# Patient Record
Sex: Male | Born: 1965 | Race: White | Hispanic: No | Smoking: Former smoker
Health system: Southern US, Community
[De-identification: ages and names within clinical notes are randomized; demographics above are authoritative.]

## PROBLEM LIST (undated history)

## (undated) DIAGNOSIS — D369 Benign neoplasm, unspecified site: Secondary | ICD-10-CM

## (undated) DIAGNOSIS — K529 Noninfective gastroenteritis and colitis, unspecified: Secondary | ICD-10-CM

## (undated) DIAGNOSIS — N419 Inflammatory disease of prostate, unspecified: Secondary | ICD-10-CM

## (undated) DIAGNOSIS — K219 Gastro-esophageal reflux disease without esophagitis: Secondary | ICD-10-CM

## (undated) DIAGNOSIS — F32A Depression, unspecified: Secondary | ICD-10-CM

## (undated) DIAGNOSIS — K5790 Diverticulosis of intestine, part unspecified, without perforation or abscess without bleeding: Secondary | ICD-10-CM

## (undated) DIAGNOSIS — I10 Essential (primary) hypertension: Secondary | ICD-10-CM

## (undated) DIAGNOSIS — K51311 Ulcerative (chronic) rectosigmoiditis with rectal bleeding: Secondary | ICD-10-CM

## (undated) DIAGNOSIS — F329 Major depressive disorder, single episode, unspecified: Secondary | ICD-10-CM

## (undated) DIAGNOSIS — T884XXA Failed or difficult intubation, initial encounter: Secondary | ICD-10-CM

## (undated) DIAGNOSIS — N451 Epididymitis: Secondary | ICD-10-CM

## (undated) DIAGNOSIS — F419 Anxiety disorder, unspecified: Secondary | ICD-10-CM

## (undated) DIAGNOSIS — M109 Gout, unspecified: Secondary | ICD-10-CM

## (undated) DIAGNOSIS — J309 Allergic rhinitis, unspecified: Secondary | ICD-10-CM

## (undated) HISTORY — PX: FUNCTIONAL ENDOSCOPIC SINUS SURGERY: SUR616

## (undated) HISTORY — PX: COLONOSCOPY: SHX174

## (undated) HISTORY — PX: TONSILLECTOMY: SUR1361

---

## 1989-01-13 HISTORY — PX: BACK SURGERY: SHX140

## 2006-01-13 HISTORY — PX: OTHER SURGICAL HISTORY: SHX169

## 2006-06-01 ENCOUNTER — Ambulatory Visit: Payer: Self-pay | Admitting: Unknown Physician Specialty

## 2006-08-10 ENCOUNTER — Ambulatory Visit: Payer: Self-pay | Admitting: Otolaryngology

## 2006-10-13 ENCOUNTER — Ambulatory Visit: Payer: Self-pay | Admitting: Gastroenterology

## 2013-12-26 ENCOUNTER — Ambulatory Visit: Payer: Self-pay

## 2014-01-13 DIAGNOSIS — K51311 Ulcerative (chronic) rectosigmoiditis with rectal bleeding: Secondary | ICD-10-CM

## 2014-01-13 HISTORY — DX: Ulcerative (chronic) rectosigmoiditis with rectal bleeding: K51.311

## 2014-02-01 DIAGNOSIS — I10 Essential (primary) hypertension: Secondary | ICD-10-CM | POA: Insufficient documentation

## 2014-02-01 DIAGNOSIS — K219 Gastro-esophageal reflux disease without esophagitis: Secondary | ICD-10-CM | POA: Insufficient documentation

## 2014-02-02 ENCOUNTER — Ambulatory Visit: Payer: Self-pay | Admitting: Internal Medicine

## 2014-02-20 ENCOUNTER — Ambulatory Visit: Payer: Self-pay | Admitting: Gastroenterology

## 2014-03-20 DIAGNOSIS — K51311 Ulcerative (chronic) rectosigmoiditis with rectal bleeding: Secondary | ICD-10-CM | POA: Insufficient documentation

## 2014-04-11 ENCOUNTER — Emergency Department: Payer: Self-pay | Admitting: Emergency Medicine

## 2014-04-12 ENCOUNTER — Emergency Department
Admit: 2014-04-12 | Disposition: A | Discharge status: Home / Self Care | Payer: Self-pay | Admitting: Emergency Medicine

## 2014-05-08 LAB — SURGICAL PATHOLOGY

## 2014-08-19 ENCOUNTER — Emergency Department
Admission: EM | Admit: 2014-08-19 | Discharge: 2014-08-19 | Disposition: A | Discharge status: Home / Self Care | Payer: Self-pay

## 2014-08-24 ENCOUNTER — Other Ambulatory Visit: Payer: Self-pay | Admitting: Podiatry

## 2014-08-24 ENCOUNTER — Ambulatory Visit
Admission: RE | Admit: 2014-08-24 | Discharge: 2014-08-24 | Disposition: A | Discharge status: Home / Self Care | Payer: No Typology Code available for payment source | Source: Ambulatory Visit | Attending: Podiatry | Admitting: Podiatry

## 2014-08-24 DIAGNOSIS — M79662 Pain in left lower leg: Secondary | ICD-10-CM

## 2014-08-24 DIAGNOSIS — R600 Localized edema: Secondary | ICD-10-CM | POA: Insufficient documentation

## 2014-08-24 DIAGNOSIS — M7989 Other specified soft tissue disorders: Secondary | ICD-10-CM

## 2015-01-14 DIAGNOSIS — D369 Benign neoplasm, unspecified site: Secondary | ICD-10-CM

## 2015-01-14 HISTORY — DX: Benign neoplasm, unspecified site: D36.9

## 2015-05-11 ENCOUNTER — Other Ambulatory Visit (HOSPITAL_COMMUNITY): Payer: Self-pay | Admitting: Nurse Practitioner

## 2015-05-11 DIAGNOSIS — K529 Noninfective gastroenteritis and colitis, unspecified: Secondary | ICD-10-CM

## 2015-05-11 DIAGNOSIS — R1084 Generalized abdominal pain: Secondary | ICD-10-CM

## 2015-05-15 ENCOUNTER — Ambulatory Visit: Admission: RE | Admit: 2015-05-15 | Payer: PRIVATE HEALTH INSURANCE | Source: Ambulatory Visit

## 2015-08-10 ENCOUNTER — Other Ambulatory Visit: Payer: Self-pay | Admitting: Family Medicine

## 2015-08-11 LAB — CMP12+LP+TP+TSH+6AC+PSA+CBC…
ALT: 44 IU/L (ref 0–44)
AST: 32 IU/L (ref 0–40)
Albumin/Globulin Ratio: 2 (ref 1.2–2.2)
Albumin: 4.5 g/dL (ref 3.5–5.5)
Alkaline Phosphatase: 61 IU/L (ref 39–117)
BUN / CREAT RATIO: 13 (ref 9–20)
BUN: 13 mg/dL (ref 6–24)
Basophils Absolute: 0.1 10*3/uL (ref 0.0–0.2)
Basos: 2 %
Bilirubin Total: 0.2 mg/dL (ref 0.0–1.2)
CHOLESTEROL TOTAL: 227 mg/dL — AB (ref 100–199)
Calcium: 9.4 mg/dL (ref 8.7–10.2)
Chloride: 99 mmol/L (ref 96–106)
Chol/HDL Ratio: 3.4 ratio units (ref 0.0–5.0)
Creatinine, Ser: 0.98 mg/dL (ref 0.76–1.27)
EOS (ABSOLUTE): 0.3 10*3/uL (ref 0.0–0.4)
EOS: 6 %
ESTIMATED CHD RISK: 0.5 times avg. (ref 0.0–1.0)
FREE THYROXINE INDEX: 1.5 (ref 1.2–4.9)
GFR, EST AFRICAN AMERICAN: 103 mL/min/{1.73_m2} (ref >59)
GFR, EST NON AFRICAN AMERICAN: 89 mL/min/{1.73_m2} (ref >59)
GGT: 30 IU/L (ref 0–65)
GLOBULIN, TOTAL: 2.3 g/dL (ref 1.5–4.5)
Glucose: 90 mg/dL (ref 65–99)
HDL: 66 mg/dL (ref >39)
HEMOGLOBIN: 13.9 g/dL (ref 12.6–17.7)
Hematocrit: 40.9 % (ref 37.5–51.0)
Immature Grans (Abs): 0 10*3/uL (ref 0.0–0.1)
Immature Granulocytes: 0 %
Iron: 91 ug/dL (ref 38–169)
LDH: 154 IU/L (ref 121–224)
LDL CALC: 135 mg/dL — AB (ref 0–99)
Lymphocytes Absolute: 1.6 10*3/uL (ref 0.7–3.1)
Lymphs: 40 %
MCH: 30.5 pg (ref 26.6–33.0)
MCHC: 34 g/dL (ref 31.5–35.7)
MCV: 90 fL (ref 79–97)
Monocytes Absolute: 0.4 10*3/uL (ref 0.1–0.9)
Monocytes: 11 %
NEUTROS ABS: 1.6 10*3/uL (ref 1.4–7.0)
Neutrophils: 41 %
PLATELETS: 299 10*3/uL (ref 150–379)
POTASSIUM: 4.6 mmol/L (ref 3.5–5.2)
Phosphorus: 3.6 mg/dL (ref 2.5–4.5)
Prostate Specific Ag, Serum: 0.7 ng/mL (ref 0.0–4.0)
RBC: 4.56 x10E6/uL (ref 4.14–5.80)
RDW: 13.8 % (ref 12.3–15.4)
SODIUM: 136 mmol/L (ref 134–144)
T3 Uptake Ratio: 29 % (ref 24–39)
T4, Total: 5.3 ug/dL (ref 4.5–12.0)
TSH: 1.91 u[IU]/mL (ref 0.450–4.500)
Total Protein: 6.8 g/dL (ref 6.0–8.5)
Triglycerides: 130 mg/dL (ref 0–149)
Uric Acid: 7.2 mg/dL (ref 3.7–8.6)
VLDL Cholesterol Cal: 26 mg/dL (ref 5–40)
WBC: 3.9 10*3/uL (ref 3.4–10.8)

## 2015-08-11 LAB — HGB A1C W/O EAG: Hgb A1c MFr Bld: 5.3 % (ref 4.8–5.6)

## 2015-08-11 LAB — VITAMIN D 25 HYDROXY (VIT D DEFICIENCY, FRACTURES): Vit D, 25-Hydroxy: 23.3 ng/mL — ABNORMAL LOW (ref 30.0–100.0)

## 2015-09-26 ENCOUNTER — Other Ambulatory Visit: Payer: Self-pay | Admitting: Gastroenterology

## 2015-09-26 DIAGNOSIS — R1084 Generalized abdominal pain: Secondary | ICD-10-CM

## 2015-09-26 DIAGNOSIS — K51019 Ulcerative (chronic) pancolitis with unspecified complications: Secondary | ICD-10-CM

## 2015-10-01 ENCOUNTER — Ambulatory Visit
Admission: RE | Admit: 2015-10-01 | Discharge: 2015-10-01 | Disposition: A | Discharge status: Home / Self Care | Payer: No Typology Code available for payment source | Source: Ambulatory Visit | Attending: Gastroenterology | Admitting: Gastroenterology

## 2015-10-01 DIAGNOSIS — K51919 Ulcerative colitis, unspecified with unspecified complications: Secondary | ICD-10-CM | POA: Insufficient documentation

## 2015-10-01 DIAGNOSIS — R933 Abnormal findings on diagnostic imaging of other parts of digestive tract: Secondary | ICD-10-CM | POA: Insufficient documentation

## 2015-10-01 DIAGNOSIS — K5730 Diverticulosis of large intestine without perforation or abscess without bleeding: Secondary | ICD-10-CM | POA: Diagnosis not present

## 2015-10-01 DIAGNOSIS — R1084 Generalized abdominal pain: Secondary | ICD-10-CM | POA: Diagnosis not present

## 2015-10-01 DIAGNOSIS — K760 Fatty (change of) liver, not elsewhere classified: Secondary | ICD-10-CM | POA: Insufficient documentation

## 2015-10-01 DIAGNOSIS — R9341 Abnormal radiologic findings on diagnostic imaging of renal pelvis, ureter, or bladder: Secondary | ICD-10-CM | POA: Insufficient documentation

## 2015-10-01 DIAGNOSIS — K51019 Ulcerative (chronic) pancolitis with unspecified complications: Secondary | ICD-10-CM

## 2015-10-01 HISTORY — DX: Essential (primary) hypertension: I10

## 2015-10-01 MED ORDER — IOPAMIDOL (ISOVUE-300) INJECTION 61%
100.0000 mL | Freq: Once | INTRAVENOUS | Status: AC | PRN
  Administered 2015-10-01: 100 mL via INTRAVENOUS

## 2015-10-29 DIAGNOSIS — D369 Benign neoplasm, unspecified site: Secondary | ICD-10-CM | POA: Insufficient documentation

## 2016-05-13 ENCOUNTER — Ambulatory Visit: Payer: Self-pay | Admitting: Registered Nurse

## 2016-05-13 VITALS — BP 136/87 | HR 77 | Temp 98.0°F | Resp 16

## 2016-05-13 DIAGNOSIS — J301 Allergic rhinitis due to pollen: Secondary | ICD-10-CM

## 2016-05-13 DIAGNOSIS — J0101 Acute recurrent maxillary sinusitis: Secondary | ICD-10-CM

## 2016-05-13 DIAGNOSIS — H6593 Unspecified nonsuppurative otitis media, bilateral: Secondary | ICD-10-CM

## 2016-05-13 MED ORDER — SALINE SPRAY 0.65 % NA SOLN: 2.0000 | NASAL | 0 refills | Status: DC

## 2016-05-13 MED ORDER — ACETAMINOPHEN 325 MG PO TABS: 650.0000 mg | ORAL_TABLET | Freq: Four times a day (QID) | ORAL | Status: AC | PRN

## 2016-05-13 MED ORDER — AZITHROMYCIN 250 MG PO TABS: ORAL_TABLET | ORAL | 0 refills | Status: DC

## 2016-05-13 NOTE — Patient Instructions (Addendum)
Continue claritin 10mg  by mouth daily, flonase 1 spray each nostril twice a day Nasal saline 2 sprays each nostril every 2 hours as needed congestion at least twice a day use in shower Shower twice a day (prior to bedtime) Do not sleep with open windows Start azithromycin-2 tabs by mouth today then 1 tab daily days 2-5 Tylenol 650-1000mg  by mouth every 6 hours as needed for pain/fever Cough drops by mouth every 2 hours as needed for cough Follow up re-evaluation if no improvement with 48 hours plan of care and/or coughing up blood, shortness of breath, wheezing, ear discharge  Allergic Rhinitis Allergic rhinitis is when the mucous membranes in the nose respond to allergens. Allergens are particles in the air that cause your body to have an allergic reaction. This causes you to release allergic antibodies. Through a chain of events, these eventually cause you to release histamine into the blood stream. Although meant to protect the body, it is this release of histamine that causes your discomfort, such as frequent sneezing, congestion, and an itchy, runny nose. What are the causes? Seasonal allergic rhinitis (hay fever) is caused by pollen allergens that may come from grasses, trees, and weeds. Year-round allergic rhinitis (perennial allergic rhinitis) is caused by allergens such as house dust mites, pet dander, and mold spores. What are the signs or symptoms?  Nasal stuffiness (congestion).  Itchy, runny nose with sneezing and tearing of the eyes. How is this diagnosed? Your health care provider can help you determine the allergen or allergens that trigger your symptoms. If you and your health care provider are unable to determine the allergen, skin or blood testing may be used. Your health care provider will diagnose your condition after taking your health history and performing a physical exam. Your health care provider may assess you for other related conditions, such as asthma, pink eye, or an  ear infection. How is this treated? Allergic rhinitis does not have a cure, but it can be controlled by:  Medicines that block allergy symptoms. These may include allergy shots, nasal sprays, and oral antihistamines.  Avoiding the allergen. Hay fever may often be treated with antihistamines in pill or nasal spray forms. Antihistamines block the effects of histamine. There are over-the-counter medicines that may help with nasal congestion and swelling around the eyes. Check with your health care provider before taking or giving this medicine. If avoiding the allergen or the medicine prescribed do not work, there are many new medicines your health care provider can prescribe. Stronger medicine may be used if initial measures are ineffective. Desensitizing injections can be used if medicine and avoidance does not work. Desensitization is when a patient is given ongoing shots until the body becomes less sensitive to the allergen. Make sure you follow up with your health care provider if problems continue. Follow these instructions at home: It is not possible to completely avoid allergens, but you can reduce your symptoms by taking steps to limit your exposure to them. It helps to know exactly what you are allergic to so that you can avoid your specific triggers. Contact a health care provider if:  You have a fever.  You develop a cough that does not stop easily (persistent).  You have shortness of breath.  You start wheezing.  Symptoms interfere with normal daily activities. This information is not intended to replace advice given to you by your health care provider. Make sure you discuss any questions you have with your health care provider. Document Released:  09/24/2000 Document Revised: 08/31/2015 Document Reviewed: 09/06/2012 Elsevier Interactive Patient Education  2017 [employer].  Otitis Media With Effusion, Pediatric Otitis media with effusion (OME) occurs when there is inflammation  of the middle ear and fluid in the middle ear space. There are no signs and symptoms of infection. The middle ear space contains air and the bones for hearing. Air in the middle ear space helps to transmit sound to the brain. OME is a common condition in children, and it often occurs after an ear infection. This condition may be present for several weeks or longer after an ear infection. Most cases of this condition get better on their own. What are the causes? OME is caused by a blockage of the eustachian tube in one or both ears. These tubes drain fluid in the ears to the back of the nose (nasopharynx). If the tissue in the tube swells up (edema), the tube closes. This prevents fluid from draining. Blockage can be caused by:  Ear infections.  Colds and other upper respiratory infections.  Allergies.  Irritants, such as tobacco smoke.  Enlarged adenoids. The adenoids are areas of soft tissue located high in the back of the throat, behind the nose and the roof of the mouth. They are part of the body's natural defense (immune) system.  A mass in the nasopharynx.  Damage to the ear caused by pressure changes (barotrauma). What increases the risk? Your child is more likely to develop this condition if:  He or she has repeated ear and sinus infections.  He or she has allergies.  He or she is exposed to tobacco smoke.  He or she attends daycare.  He or she is not breastfed. What are the signs or symptoms? Symptoms of this condition may not be obvious. Sometimes this condition does not have any symptoms, or symptoms may overlap with those of a cold or upper respiratory tract illness. Symptoms of this condition include:  Temporary hearing loss.  A feeling of fullness in the ear without pain.  Irritability or agitation.  Balance (vestibular) problems. As a result of hearing loss, your child may:  Listen to the TV at a loud volume.  Not respond to questions.  Ask "What?" often  when spoken to.  Mistake or confuse one sound or word for another.  Perform poorly at school.  Have a poor attention span.  Become agitated or irritated easily. How is this diagnosed? This condition is diagnosed with an ear exam. Your child's health care provider will look inside your child's ear with an instrument (otoscope) to check for redness, swelling, and fluid. Other tests may be done, including:  A test to check the movement of the eardrum (pneumatic otoscopy). This is done by squeezing a small amount of air into the ear.  A test that changes air pressure in the middle ear to check how well the eardrum moves and to see if the eustachian tube is working (tympanogram).  Hearing test (audiogram). This test involves playing tones at different pitches to see if your child can hear each tone. How is this treated? Treatment for this condition depends on the cause. In many cases, the fluid goes away on its own. In some cases, your child may need a procedure to create a hole in the eardrum to allow fluid to drain (myringotomy) and to insert small drainage tubes (tympanostomy tubes) into the eardrums. These tubes help to drain fluid and prevent infection. This procedure may be recommended if:  OME does  not get better over several months.  Your child has many ear infections within several months.  Your child has noticeable hearing loss.  Your child has problems with speech and language development. Surgery may also be done to remove the adenoids (adenoidectomy). Follow these instructions at home:  Give over-the-counter and prescription medicines only as told by your child's health care provider.  Keep children away from any tobacco smoke.  Keep all follow-up visits as told by your child's health care provider. This is important. How is this prevented?  Keep your child's vaccinations up to date. Make sure your child gets all recommended vaccinations, including a pneumonia and flu  vaccine.  Encourage hand washing. Your child should wash his or her hands often with soap and water. If there is no soap and water, he or she should use hand sanitizer.  Avoid exposing your child to tobacco smoke.  Breastfeed your baby, if possible. Babies who are breastfed as long as possible are less likely to develop this condition. Contact a health care provider if:  Your child's hearing does not get better after 3 months.  Your child's hearing is worse.  Your child has ear pain.  Your child has a fever.  Your child has drainage from the ear.  Your child is dizzy.  Your child has a lump on his or her neck. Get help right away if:  Your child has bleeding from the nose.  Your child cannot move part of her or his face.  Your child has trouble breathing.  Your child cannot smell.  Your child develops severe congestion.  Your child develops weakness.  Your child who is younger than 3 months has a temperature of 100F (38C) or higher. Summary  Otitis media with effusion (OME) occurs when there is inflammation of the middle ear and fluid in the middle ear space.  This condition is caused by blockage of one or both eustachian tubes, which drain fluid in the ears to the back of the nose.  Symptoms of this condition can include temporary hearing loss, a feeling of fullness in the ear, irritability or agitation, and balance (vestibular) problems. Sometimes, there are no symptoms.  This condition is diagnosed with an ear exam and tests, such as pneumatic otoscopy, tympanogram, and audiogram.  Treatment for this condition depends on the cause. In many cases, the fluid goes away on its own. This information is not intended to replace advice given to you by your health care provider. Make sure you discuss any questions you have with your health care provider. Document Released: 03/22/2003 Document Revised: 11/22/2015 Document Reviewed: 11/22/2015 Elsevier Interactive Patient  Education  2017 [employer]  Sinusitis, Adult Sinusitis is soreness and inflammation of your sinuses. Sinuses are hollow spaces in the bones around your face. Your sinuses are located:  Around your eyes.  In the middle of your forehead.  Behind your nose.  In your cheekbones. Your sinuses and nasal passages are lined with a stringy fluid (mucus). Mucus normally drains out of your sinuses. When your nasal tissues become inflamed or swollen, the mucus can become trapped or blocked so air cannot flow through your sinuses. This allows bacteria, viruses, and funguses to grow, which leads to infection. Sinusitis can develop quickly and last for 7?10 days (acute) or for more than 12 weeks (chronic). Sinusitis often develops after a cold. What are the causes? This condition is caused by anything that creates swelling in the sinuses or stops mucus from draining, including:  Allergies.  Asthma.  Bacterial or viral infection.  Abnormally shaped bones between the nasal passages.  Nasal growths that contain mucus (nasal polyps).  Narrow sinus openings.  Pollutants, such as chemicals or irritants in the air.  A foreign object stuck in the nose.  A fungal infection. This is rare. What increases the risk? The following factors may make you more likely to develop this condition:  Having allergies or asthma.  Having had a recent cold or respiratory tract infection.  Having structural deformities or blockages in your nose or sinuses.  Having a weak immune system.  Doing a lot of swimming or diving.  Overusing nasal sprays.  Smoking. What are the signs or symptoms? The main symptoms of this condition are pain and a feeling of pressure around the affected sinuses. Other symptoms include:  Upper toothache.  Earache.  Headache.  Bad breath.  Decreased sense of smell and taste.  A cough that may get worse at night.  Fatigue.  Fever.  Thick drainage from your nose. The  drainage is often green and it may contain pus (purulent).  Stuffy nose or congestion.  Postnasal drip. This is when extra mucus collects in the throat or back of the nose.  Swelling and warmth over the affected sinuses.  Sore throat.  Sensitivity to light. How is this diagnosed? This condition is diagnosed based on symptoms, a medical history, and a physical exam. To find out if your condition is acute or chronic, your health care provider may:  Look in your nose for signs of nasal polyps.  Tap over the affected sinus to check for signs of infection.  View the inside of your sinuses using an imaging device that has a light attached (endoscope). If your health care provider suspects that you have chronic sinusitis, you may also:  Be tested for allergies.  Have a sample of mucus taken from your nose (nasal culture) and checked for bacteria.  Have a mucus sample examined to see if your sinusitis is related to an allergy. If your sinusitis does not respond to treatment and it lasts longer than 8 weeks, you may have an MRI or CT scan to check your sinuses. These scans also help to determine how severe your infection is. In rare cases, a bone biopsy may be done to rule out more serious types of fungal sinus disease. How is this treated? Treatment for sinusitis depends on the cause and whether your condition is chronic or acute. If a virus is causing your sinusitis, your symptoms will go away on their own within 10 days. You may be given medicines to relieve your symptoms, including:  Topical nasal decongestants. They shrink swollen nasal passages and let mucus drain from your sinuses.  Antihistamines. These drugs block inflammation that is triggered by allergies. This can help to ease swelling in your nose and sinuses.  Topical nasal corticosteroids. These are nasal sprays that ease inflammation and swelling in your nose and sinuses.  Nasal saline washes. These rinses can help to get  rid of thick mucus in your nose. If your condition is caused by bacteria, you will be given an antibiotic medicine. If your condition is caused by a fungus, you will be given an antifungal medicine. Surgery may be needed to correct underlying conditions, such as narrow nasal passages. Surgery may also be needed to remove polyps. Follow these instructions at home: Medicines   Take, use, or apply over-the-counter and prescription medicines only as told by your health care  provider. These may include nasal sprays.  If you were prescribed an antibiotic medicine, take it as told by your health care provider. Do not stop taking the antibiotic even if you start to feel better. Hydrate and Humidify   Drink enough water to keep your urine clear or pale yellow. Staying hydrated will help to thin your mucus.  Use a cool mist humidifier to keep the humidity level in your home above 50%.  Inhale steam for 10-15 minutes, 3-4 times a day or as told by your health care provider. You can do this in the bathroom while a hot shower is running.  Limit your exposure to cool or dry air. Rest   Rest as much as possible.  Sleep with your head raised (elevated).  Make sure to get enough sleep each night. General instructions   Apply a warm, moist washcloth to your face 3-4 times a day or as told by your health care provider. This will help with discomfort.  Wash your hands often with soap and water to reduce your exposure to viruses and other germs. If soap and water are not available, use hand sanitizer.  Do not smoke. Avoid being around people who are smoking (secondhand smoke).  Keep all follow-up visits as told by your health care provider. This is important. Contact a health care provider if:  You have a fever.  Your symptoms get worse.  Your symptoms do not improve within 10 days. Get help right away if:  You have a severe headache.  You have persistent vomiting.  You have pain or  swelling around your face or eyes.  You have vision problems.  You develop confusion.  Your neck is stiff.  You have trouble breathing. This information is not intended to replace advice given to you by your health care provider. Make sure you discuss any questions you have with your health care provider. Document Released: 12/30/2004 Document Revised: 08/26/2015 Document Reviewed: 10/25/2014 Elsevier Interactive Patient Education  2017 [employer].

## 2016-05-13 NOTE — Progress Notes (Signed)
Subjective:    Patient ID: Rodney Cisneros, male    DOB: 1965-10-17, 51 y.o.   MRN: [id number]  51y/o married caucasian male here for evaluation teeth pain, post nasal drip, headache, sinus pain/pressure, fever/sweats x 2 days.  Tried flonase/claritin/saline nasal rinses without any improvement.  Typically seasonal allergies spring.  Has been exposed to sick coworkers at work in past 2 weeks.  Azithromycin has worked the best for him as recurrent sinusitis typically spring and fall.  Doesn't feel like he needs oral steroids at this time.  Not sleeping with windows open.      Review of Systems  Constitutional: Positive for diaphoresis and fever. Negative for activity change, appetite change, chills, fatigue and unexpected weight change.  HENT: Positive for congestion, postnasal drip, rhinorrhea, sinus pain, sinus pressure and sneezing. Negative for dental problem, drooling, ear discharge, ear pain, facial swelling, hearing loss, mouth sores, nosebleeds, sore throat, tinnitus, trouble swallowing and voice change.   Eyes: Negative for photophobia, pain, discharge, redness, itching and visual disturbance.  Respiratory: Negative for cough, choking, chest tightness, shortness of breath, wheezing and stridor.   Cardiovascular: Negative for chest pain, palpitations and leg swelling.  Gastrointestinal: Negative for abdominal distention, abdominal pain, blood in stool, constipation, diarrhea, nausea and vomiting.  Endocrine: Negative for cold intolerance and heat intolerance.  Genitourinary: Negative for dysuria.  Musculoskeletal: Negative for arthralgias, back pain, gait problem, joint swelling, myalgias, neck pain and neck stiffness.  Skin: Negative for color change, pallor, rash and wound.  Allergic/Immunologic: Positive for environmental allergies. Negative for food allergies and immunocompromised state.  Neurological: Positive for headaches. Negative for dizziness, tremors, seizures, syncope,  facial asymmetry, speech difficulty, weakness, light-headedness and numbness.  Hematological: Negative for adenopathy. Does not bruise/bleed easily.  Psychiatric/Behavioral: Negative for agitation, behavioral problems, confusion and sleep disturbance.       Objective:   Physical Exam  Constitutional: He is oriented to person, place, and time. Vital signs are normal. He appears well-developed and well-nourished. He is active and cooperative.  Non-toxic appearance. He does not have a sickly appearance. He appears ill. No distress.  HENT:  Head: Normocephalic and atraumatic.  Right Ear: Hearing, external ear and ear canal normal. A middle ear effusion is present.  Left Ear: Hearing, external ear and ear canal normal. A middle ear effusion is present.  Nose: Mucosal edema and rhinorrhea present. No nose lacerations, sinus tenderness, nasal deformity, septal deviation or nasal septal hematoma. No epistaxis.  No foreign bodies. Right sinus exhibits maxillary sinus tenderness and frontal sinus tenderness. Left sinus exhibits maxillary sinus tenderness and frontal sinus tenderness.  Mouth/Throat: Uvula is midline and mucous membranes are normal. Mucous membranes are not pale, not dry and not cyanotic. He does not have dentures. No oral lesions. No trismus in the jaw. Normal dentition. No dental abscesses, uvula swelling, lacerations or dental caries. Posterior oropharyngeal edema and posterior oropharyngeal erythema present. No oropharyngeal exudate or tonsillar abscesses.  Bilateral TMs air fluid level clear; bilateral nasal turbinates edema/erythema clear discharge; bilateral allergic shiners; cobblestoning posterior pharynx; maxillary greater than frontal sinuses bilaterally TTP  Eyes: Conjunctivae, EOM and lids are normal. Pupils are equal, round, and reactive to light. Right eye exhibits no chemosis, no discharge, no exudate and no hordeolum. No foreign body present in the right eye. Left eye exhibits  no chemosis, no discharge, no exudate and no hordeolum. No foreign body present in the left eye. Right conjunctiva is not injected. Right conjunctiva has no hemorrhage. Left  conjunctiva is not injected. Left conjunctiva has no hemorrhage. No scleral icterus. Right eye exhibits normal extraocular motion and no nystagmus. Left eye exhibits normal extraocular motion and no nystagmus. Right pupil is round and reactive. Left pupil is round and reactive. Pupils are equal.  Neck: Trachea normal, normal range of motion and phonation normal. Neck supple. No tracheal tenderness, no spinous process tenderness and no muscular tenderness present. No neck rigidity. No tracheal deviation, no edema, no erythema and normal range of motion present. No thyroid mass and no thyromegaly present.  Cardiovascular: Normal rate, regular rhythm, S1 normal, S2 normal, normal heart sounds and intact distal pulses.  PMI is not displaced.  Exam reveals no gallop and no friction rub.   No murmur heard. Pulmonary/Chest: Effort normal and breath sounds normal. No stridor. No respiratory distress. He has no decreased breath sounds. He has no wheezes. He has no rhonchi. He has no rales.  No cough observed in exam room; speaks full sentences without difficulty  Abdominal: Soft. He exhibits no distension.  Musculoskeletal: Normal range of motion. He exhibits no edema or tenderness.       Right shoulder: Normal.       Left shoulder: Normal.       Right elbow: Normal.      Left elbow: Normal.       Right hip: Normal.       Left hip: Normal.       Right knee: Normal.       Left knee: Normal.       Cervical back: Normal.       Thoracic back: Normal.       Lumbar back: Normal.  Lymphadenopathy:       Head (right side): No submental, no submandibular, no tonsillar, no preauricular, no posterior auricular and no occipital adenopathy present.       Head (left side): No submental, no submandibular, no tonsillar, no preauricular, no posterior  auricular and no occipital adenopathy present.    He has no cervical adenopathy.       Right cervical: No superficial cervical, no deep cervical and no posterior cervical adenopathy present.      Left cervical: No superficial cervical, no deep cervical and no posterior cervical adenopathy present.  Neurological: He is alert and oriented to person, place, and time. He has normal strength. He is not disoriented. He displays no atrophy and no tremor. No cranial nerve deficit or sensory deficit. He exhibits normal muscle tone. He displays no seizure activity. Coordination and gait normal. GCS eye subscore is 4. GCS verbal subscore is 5. GCS motor subscore is 6.  On/off exam table; in/out chair without difficulty; bilateral hand grasp 5/5 equal; gait sure and steady hall  Skin: Skin is warm and intact. No abrasion, no bruising, no burn, no ecchymosis, no laceration, no lesion, no petechiae and no rash noted. He is diaphoretic. No cyanosis or erythema. No pallor. Nails show no clubbing.  Psychiatric: He has a normal mood and affect. His speech is normal and behavior is normal. Judgment and thought content normal. Cognition and memory are normal.  Nursing note and vitals reviewed.         Assessment & Plan:  A-recurrent maxillary sinusitis acute; seasonal allergic rhinitis and bilateral otitis media effusion  P-start azithromycin 500mg  po today then 250mg  po daily days 2-5 #6 RF0 dispensed from [facility] to patient.  Refused oral steroids at this time.  Tylenol 650-1000mg  po q6h prn pain/fever OTC.  Patient may use normal saline nasal spray as needed.  Continue antihistamine and nasal steroid use.  Avoid triggers if possible.  Shower prior to bedtime if exposed to triggers.  If allergic dust/dust mites recommend mattress/pillow covers/encasements; washing linens, vacuuming, sweeping, dusting weekly.  Call or return to clinic as needed if these symptoms worsen or fail to improve as anticipated.   Exitcare  handout on allergic rhinitis given to patient.  Patient verbalized understanding of instructions, agreed with plan of care and had no further questions at this time.  P2:  Avoidance and hand washing.   Supportive treatment.   No evidence of invasive bacterial infection, non toxic and well hydrated.  This is most likely self limiting viral infection.  I do not see where any further testing or imaging is necessary at this time.   I will suggest supportive care, rest, good hygiene and encourage the patient to take adequate fluids.  The patient is to return to clinic or EMERGENCY ROOM if symptoms worsen or change significantly e.g. ear pain, fever, purulent discharge from ears or bleeding.  Exitcare handout on otitis media with effusion given to patient.  Patient verbalized agreement and understanding of treatment plan.    No evidence of systemic bacterial infection, non toxic and well hydrated.  I do not see where any further testing or imaging is necessary at this time.   I will suggest supportive care, rest, good hygiene and encourage the patient to take adequate fluids.  The patient is to return to clinic or EMERGENCY ROOM if symptoms worsen or change significantly.  Exitcare handout on sinusitis given to patient.  Patient verbalized agreement and understanding of treatment plan and had no further questions at this time.   P2:  Hand washing and cover cough

## 2016-05-15 ENCOUNTER — Ambulatory Visit: Payer: Self-pay | Admitting: Registered Nurse

## 2016-05-15 VITALS — BP 139/81 | HR 73 | Temp 98.3°F

## 2016-05-15 DIAGNOSIS — J301 Allergic rhinitis due to pollen: Secondary | ICD-10-CM

## 2016-05-15 DIAGNOSIS — H6593 Unspecified nonsuppurative otitis media, bilateral: Secondary | ICD-10-CM

## 2016-05-15 DIAGNOSIS — J209 Acute bronchitis, unspecified: Secondary | ICD-10-CM

## 2016-05-15 MED ORDER — PREDNISONE 10 MG (21) PO TBPK: ORAL_TABLET | ORAL | 0 refills | Status: DC

## 2016-05-15 MED ORDER — MONTELUKAST SODIUM 10 MG PO TABS
10.0000 mg | ORAL_TABLET | Freq: Every day | ORAL | 3 refills | Status: DC
Start: 2016-05-15 — End: 2022-04-29

## 2016-05-15 MED ORDER — ALBUTEROL SULFATE HFA 108 (90 BASE) MCG/ACT IN AERS: 1.0000 | INHALATION_SPRAY | RESPIRATORY_TRACT | 0 refills | Status: DC | PRN

## 2016-05-15 NOTE — Progress Notes (Signed)
Pt presents for f/u from Tuesday appt. Feeling worse. Productive cough. Nasal congestion. Taking Z-pak and started guaifenesin/dextromethorphan.  Subjective:    Patient ID: Rodney Cisneros, male    DOB: [birth year], 51 y.o.   MRN: [id number]  HPI  51y/o married caucasian male here for evaluation teeth pain, post nasal drip, headache, sinus pain/pressure, fever/sweats x 2 days.  Tried flonase/claritin/saline nasal rinses without any improvement.  Typically seasonal allergies spring.  Has been exposed to sick coworkers at work in past 2 weeks.  Azithromycin has worked the best for him as recurrent sinusitis typically spring and fall.  Doesn't feel like he needs oral steroids at this time.  Not sleeping with windows open.  Review of Systems   Constitutional: Positive for diaphoresis and fever. Negative for activity change, appetite change, chills, fatigue and unexpected weight change.  HENT: Positive for congestion, postnasal drip, rhinorrhea, sinus pain, sinus pressure and sneezing. Negative for dental problem, drooling, ear discharge, ear pain, facial swelling, hearing loss, mouth sores, nosebleeds, sore throat, tinnitus, trouble swallowing and voice change.   Eyes: Negative for photophobia, pain, discharge, redness, itching and visual disturbance.  Respiratory: Negative for cough, choking, chest tightness, shortness of breath, wheezing and stridor.   Cardiovascular: Negative for chest pain, palpitations and leg swelling.  Gastrointestinal: Negative for abdominal distention, abdominal pain, blood in stool, constipation, diarrhea, nausea and vomiting.  Endocrine: Negative for cold intolerance and heat intolerance.  Genitourinary: Negative for dysuria.  Musculoskeletal: Negative for arthralgias, back pain, gait problem, joint swelling, myalgias, neck pain and neck stiffness.  Skin: Negative for color change, pallor, rash and wound.  Allergic/Immunologic: Positive for environmental allergies.  Negative for food allergies and immunocompromised state.  Neurological: Positive for headaches. Negative for dizziness, tremors, seizures, syncope, facial asymmetry, speech difficulty, weakness, light-headedness and numbness.  Hematological: Negative for adenopathy. Does not bruise/bleed easily.  Psychiatric/Behavioral: Negative for agitation, behavioral problems, confusion and sleep disturbance.        Objective:   Physical Exam  Pulmonary/Chest: No accessory muscle usage. No respiratory distress. He has decreased breath sounds in the right lower field and the left lower field. He has wheezes in the right upper field, the right middle field, the left upper field and the left middle field. He has no rhonchi. He has no rales.   Constitutional: He is oriented to person, place, and time. Vital signs are normal. He appears well-developed and well-nourished. He is active and cooperative.  Non-toxic appearance. He does not have a sickly appearance. He appears ill. No distress.  HENT:  Head: Normocephalic and atraumatic.  Right Ear: Hearing, external ear and ear canal normal. A middle ear effusion is present.  Left Ear: Hearing, external ear and ear canal normal. A middle ear effusion is present.  Nose: Mucosal edema and rhinorrhea present. No nose lacerations, sinus tenderness, nasal deformity, septal deviation or nasal septal hematoma. No epistaxis.  No foreign bodies. Right sinus exhibits maxillary sinus tenderness and frontal sinus tenderness. Left sinus exhibits maxillary sinus tenderness and frontal sinus tenderness.  Mouth/Throat: Uvula is midline and mucous membranes are normal. Mucous membranes are not pale, not dry and not cyanotic. He does not have dentures. No oral lesions. No trismus in the jaw. Normal dentition. No dental abscesses, uvula swelling, lacerations or dental caries. Posterior oropharyngeal edema and posterior oropharyngeal erythema present. No oropharyngeal exudate or tonsillar  abscesses.  Bilateral TMs air fluid level clear; bilateral nasal turbinates edema/erythema clear discharge; bilateral allergic shiners; cobblestoning posterior pharynx; maxillary greater  than frontal sinuses bilaterally TTP  Eyes: Conjunctivae, EOM and lids are normal. Pupils are equal, round, and reactive to light. Right eye exhibits no chemosis, no discharge, no exudate and no hordeolum. No foreign body present in the right eye. Left eye exhibits no chemosis, no discharge, no exudate and no hordeolum. No foreign body present in the left eye. Right conjunctiva is not injected. Right conjunctiva has no hemorrhage. Left conjunctiva is not injected. Left conjunctiva has no hemorrhage. No scleral icterus. Right eye exhibits normal extraocular motion and no nystagmus. Left eye exhibits normal extraocular motion and no nystagmus. Right pupil is round and reactive. Left pupil is round and reactive. Pupils are equal.  Neck: Trachea normal, normal range of motion and phonation normal. Neck supple. No tracheal tenderness, no spinous process tenderness and no muscular tenderness present. No neck rigidity. No tracheal deviation, no edema, no erythema and normal range of motion present. No thyroid mass and no thyromegaly present.  Cardiovascular: Normal rate, regular rhythm, S1 normal, S2 normal, normal heart sounds and intact distal pulses.  PMI is not displaced.  Exam reveals no gallop and no friction rub.   No murmur heard.  No cough observed in exam room; speaks full sentences without difficulty audible wheeze without auscultation today  Abdominal: Soft. He exhibits no distension.  Musculoskeletal: Normal range of motion. He exhibits no edema or tenderness.       Right shoulder: Normal.       Left shoulder: Normal.       Right elbow: Normal.      Left elbow: Normal.       Right hip: Normal.       Left hip: Normal.       Right knee: Normal.       Left knee: Normal.       Cervical back: Normal.        Thoracic back: Normal.       Lumbar back: Normal.  Lymphadenopathy:       Head (right side): No submental, no submandibular, no tonsillar, no preauricular, no posterior auricular and no occipital adenopathy present.       Head (left side): No submental, no submandibular, no tonsillar, no preauricular, no posterior auricular and no occipital adenopathy present.    He has no cervical adenopathy.       Right cervical: No superficial cervical, no deep cervical and no posterior cervical adenopathy present.      Left cervical: No superficial cervical, no deep cervical and no posterior cervical adenopathy present.  Neurological: He is alert and oriented to person, place, and time. He has normal strength. He is not disoriented. He displays no atrophy and no tremor. No cranial nerve deficit or sensory deficit. He exhibits normal muscle tone. He displays no seizure activity. Coordination and gait normal. GCS eye subscore is 4. GCS verbal subscore is 5. GCS motor subscore is 6.  On/off exam table; in/out chair without difficulty; bilateral hand grasp 5/5 equal; gait sure and steady hall  Skin: Skin is warm and intact. No abrasion, no bruising, no burn, no ecchymosis, no laceration, no lesion, no petechiae and no rash noted. He is diaphoretic. No cyanosis or erythema. No pallor. Nails show no clubbing.  Psychiatric: He has a normal mood and affect. His speech is normal and behavior is normal. Judgment and thought content normal. Cognition and memory are normal.  Nursing note and vitals reviewed.     Assessment & Plan:  A-acute bronchitis; recurrent maxillary sinusitis  acute; seasonal allergic rhinitis and bilateral otitis media effusion  P-Continue azithromycin 250mg  po daily days 2-5 #6 RF0 dispensed from [facility] to patient for sinusitis on Tuesday. For bronchitis  Start prednisone taper 60mg /50/40/30/20/10mg  po daily with breakfast now #21 RF0 dispensed from [facility].  Refilled albuterol inhaler 1-2 puffs po  q4-6h prn wheeze/protracted cough as ran out this am.  Rx singulair 10mg  po qhs #30 RF3 pick up at pharmacy and start tonight.  Tylenol 650-1000mg  po q6h prn pain/fever OTC. Bronchitis simple, community acquired, may have started as viral (probably respiratory syncytial, parainfluenza, influenza, or adenovirus), but now evidence of acute purulent bronchitis with resultant bronchial edema and mucus formation.  Viruses are the most common cause of bronchial inflammation in otherwise healthy adults with acute bronchitis.  The appearance of sputum is not predictive of whether a bacterial infection is present.  Purulent sputum is most often caused by viral infections.  There are a small portion of those caused by non-viral agents being Mycoplamsa pneumonia.  Microscopic examination or C&S of sputum in the healthy adult with acute bronchitis is generally not helpful (usually negative or normal respiratory flora) other considerations being cough from upper respiratory tract infections, sinusitis or allergic syndromes (mild asthma or viral pneumonia).  Differential Diagnosis:  reactive airway disease (asthma, allergic aspergillosis (eosinophilia), chronic bronchitis, respiratory infection (Sinusitis, Common cold, pneumonia), congestive heart failure, reflux esophagitis, bronchogenic tumor, aspiration syndromes and/or exposure irritants/tobacco smoke.  In this case, there is no evidence of any invasive bacterial illness.  Most likely viral etiology so will hold on antibiotic treatment.  Advise supportive care with rest, encourage fluids, good hygiene and watch for any worsening symptoms.  If they were to develop:  come back to the office or go to the emergency room if after hours. Without high fever, severe dyspnea, lack of physical findings or other risk factors, I will hold on a chest radiograph and CBC at this time. I discussed that approximately 50% of patients with acute bronchitis have a cough that lasts up to three  weeks, and 25% for over a month.  Tylenol, one to two tablets every four hours as needed for fever or myalgias.   No aspirin.  Patient instructed to follow up in one week or sooner if symptoms worsen. Patient verbalized agreement and understanding of treatment plan.  P2:  hand washing and cover cough   Continue normal saline nasal spray as needed.  Continue antihistamine and nasal steroid use.  Avoid triggers if possible.  Shower prior to bedtime if exposed to triggers.  If allergic dust/dust mites recommend mattress/pillow covers/encasements; washing linens, vacuuming, sweeping, dusting weekly.  Call or return to clinic as needed if these symptoms worsen or fail to improve as anticipated.   Exitcare handout on allergic rhinitis given to patient.  Patient verbalized understanding of instructions, agreed with plan of care and had no further questions at this time.  P2:  Avoidance and hand washing.   Supportive treatment.   No evidence of invasive bacterial infection, non toxic and well hydrated.  This is most likely self limiting viral infection.  I do not see where any further testing or imaging is necessary at this time.   I will suggest supportive care, rest, good hygiene and encourage the patient to take adequate fluids.  The patient is to return to clinic or EMERGENCY ROOM if symptoms worsen or change significantly e.g. ear pain, fever, purulent discharge from ears or bleeding.  Exitcare handout on otitis media with effusion  given to patient.  Patient verbalized agreement and understanding of treatment plan.    No evidence of systemic bacterial infection, non toxic and well hydrated.  I do not see where any further testing or imaging is necessary at this time.   I will suggest supportive care, rest, good hygiene and encourage the patient to take adequate fluids.  The patient is to return to clinic or EMERGENCY ROOM if symptoms worsen or change significantly.  Exitcare handout on sinusitis given to  patient.  Patient verbalized agreement and understanding of treatment plan and had no further questions at this time.   P2:  Hand washing and cover cough

## 2016-05-15 NOTE — Patient Instructions (Signed)
Start prednisone taper by moth at breakfast now 60mg  today then 50mg  (5 pills tomorrow) . . .  Pick up new inhaler refilled at [facility] 1-2 puffs albuterol as needed wheezing/chest tightness/protracted coughing singulair 10mg  by mouth at bedtime Follow up re-evaluation if no improvement with 48 hours plan of care Sinusitis, Adult Sinusitis is soreness and inflammation of your sinuses. Sinuses are hollow spaces in the bones around your face. Your sinuses are located:  Around your eyes.  In the middle of your forehead.  Behind your nose.  In your cheekbones. Your sinuses and nasal passages are lined with a stringy fluid (mucus). Mucus normally drains out of your sinuses. When your nasal tissues become inflamed or swollen, the mucus can become trapped or blocked so air cannot flow through your sinuses. This allows bacteria, viruses, and funguses to grow, which leads to infection. Sinusitis can develop quickly and last for 7?10 days (acute) or for more than 12 weeks (chronic). Sinusitis often develops after a cold. What are the causes? This condition is caused by anything that creates swelling in the sinuses or stops mucus from draining, including:  Allergies.  Asthma.  Bacterial or viral infection.  Abnormally shaped bones between the nasal passages.  Nasal growths that contain mucus (nasal polyps).  Narrow sinus openings.  Pollutants, such as chemicals or irritants in the air.  A foreign object stuck in the nose.  A fungal infection. This is rare. What increases the risk? The following factors may make you more likely to develop this condition:  Having allergies or asthma.  Having had a recent cold or respiratory tract infection.  Having structural deformities or blockages in your nose or sinuses.  Having a weak immune system.  Doing a lot of swimming or diving.  Overusing nasal sprays.  Smoking. What are the signs or symptoms? The main symptoms of this  condition are pain and a feeling of pressure around the affected sinuses. Other symptoms include:  Upper toothache.  Earache.  Headache.  Bad breath.  Decreased sense of smell and taste.  A cough that may get worse at night.  Fatigue.  Fever.  Thick drainage from your nose. The drainage is often green and it may contain pus (purulent).  Stuffy nose or congestion.  Postnasal drip. This is when extra mucus collects in the throat or back of the nose.  Swelling and warmth over the affected sinuses.  Sore throat.  Sensitivity to light. How is this diagnosed? This condition is diagnosed based on symptoms, a medical history, and a physical exam. To find out if your condition is acute or chronic, your health care provider may:  Look in your nose for signs of nasal polyps.  Tap over the affected sinus to check for signs of infection.  View the inside of your sinuses using an imaging device that has a light attached (endoscope). If your health care provider suspects that you have chronic sinusitis, you may also:  Be tested for allergies.  Have a sample of mucus taken from your nose (nasal culture) and checked for bacteria.  Have a mucus sample examined to see if your sinusitis is related to an allergy. If your sinusitis does not respond to treatment and it lasts longer than 8 weeks, you may have an MRI or CT scan to check your sinuses. These scans also help to determine how severe your infection is. In rare cases, a bone biopsy may be done to rule out more serious types of fungal sinus  disease. How is this treated? Treatment for sinusitis depends on the cause and whether your condition is chronic or acute. If a virus is causing your sinusitis, your symptoms will go away on their own within 10 days. You may be given medicines to relieve your symptoms, including:  Topical nasal decongestants. They shrink swollen nasal passages and let mucus drain from your  sinuses.  Antihistamines. These drugs block inflammation that is triggered by allergies. This can help to ease swelling in your nose and sinuses.  Topical nasal corticosteroids. These are nasal sprays that ease inflammation and swelling in your nose and sinuses.  Nasal saline washes. These rinses can help to get rid of thick mucus in your nose. If your condition is caused by bacteria, you will be given an antibiotic medicine. If your condition is caused by a fungus, you will be given an antifungal medicine. Surgery may be needed to correct underlying conditions, such as narrow nasal passages. Surgery may also be needed to remove polyps. Follow these instructions at home: Medicines   Take, use, or apply over-the-counter and prescription medicines only as told by your health care provider. These may include nasal sprays.  If you were prescribed an antibiotic medicine, take it as told by your health care provider. Do not stop taking the antibiotic even if you start to feel better. Hydrate and Humidify   Drink enough water to keep your urine clear or pale yellow. Staying hydrated will help to thin your mucus.  Use a cool mist humidifier to keep the humidity level in your home above 50%.  Inhale steam for 10-15 minutes, 3-4 times a day or as told by your health care provider. You can do this in the bathroom while a hot shower is running.  Limit your exposure to cool or dry air. Rest   Rest as much as possible.  Sleep with your head raised (elevated).  Make sure to get enough sleep each night. General instructions   Apply a warm, moist washcloth to your face 3-4 times a day or as told by your health care provider. This will help with discomfort.  Wash your hands often with soap and water to reduce your exposure to viruses and other germs. If soap and water are not available, use hand sanitizer.  Do not smoke. Avoid being around people who are smoking (secondhand smoke).  Keep all  follow-up visits as told by your health care provider. This is important. Contact a health care provider if:  You have a fever.  Your symptoms get worse.  Your symptoms do not improve within 10 days. Get help right away if:  You have a severe headache.  You have persistent vomiting.  You have pain or swelling around your face or eyes.  You have vision problems.  You develop confusion.  Your neck is stiff.  You have trouble breathing. This information is not intended to replace advice given to you by your health care provider. Make sure you discuss any questions you have with your health care provider. Document Released: 12/30/2004 Document Revised: 08/26/2015 Document Reviewed: 10/25/2014 Elsevier Interactive Patient Education  2017 [employer] Allergic Rhinitis Allergic rhinitis is when the mucous membranes in the nose respond to allergens. Allergens are particles in the air that cause your body to have an allergic reaction. This causes you to release allergic antibodies. Through a chain of events, these eventually cause you to release histamine into the blood stream. Although meant to protect the body, it is this  release of histamine that causes your discomfort, such as frequent sneezing, congestion, and an itchy, runny nose. What are the causes? Seasonal allergic rhinitis (hay fever) is caused by pollen allergens that may come from grasses, trees, and weeds. Year-round allergic rhinitis (perennial allergic rhinitis) is caused by allergens such as house dust mites, pet dander, and mold spores. What are the signs or symptoms?  Nasal stuffiness (congestion).  Itchy, runny nose with sneezing and tearing of the eyes. How is this diagnosed? Your health care provider can help you determine the allergen or allergens that trigger your symptoms. If you and your health care provider are unable to determine the allergen, skin or blood testing may be used. Your health care provider will  diagnose your condition after taking your health history and performing a physical exam. Your health care provider may assess you for other related conditions, such as asthma, pink eye, or an ear infection. How is this treated? Allergic rhinitis does not have a cure, but it can be controlled by:  Medicines that block allergy symptoms. These may include allergy shots, nasal sprays, and oral antihistamines.  Avoiding the allergen. Hay fever may often be treated with antihistamines in pill or nasal spray forms. Antihistamines block the effects of histamine. There are over-the-counter medicines that may help with nasal congestion and swelling around the eyes. Check with your health care provider before taking or giving this medicine. If avoiding the allergen or the medicine prescribed do not work, there are many new medicines your health care provider can prescribe. Stronger medicine may be used if initial measures are ineffective. Desensitizing injections can be used if medicine and avoidance does not work. Desensitization is when a patient is given ongoing shots until the body becomes less sensitive to the allergen. Make sure you follow up with your health care provider if problems continue. Follow these instructions at home: It is not possible to completely avoid allergens, but you can reduce your symptoms by taking steps to limit your exposure to them. It helps to know exactly what you are allergic to so that you can avoid your specific triggers. Contact a health care provider if:  You have a fever.  You develop a cough that does not stop easily (persistent).  You have shortness of breath.  You start wheezing.  Symptoms interfere with normal daily activities. This information is not intended to replace advice given to you by your health care provider. Make sure you discuss any questions you have with your health care provider. Document Released: 09/24/2000 Document Revised: 08/31/2015 Document  Reviewed: 09/06/2012 Elsevier Interactive Patient Education  2017 [employer]  Acute Bronchitis, Adult Acute bronchitis is sudden (acute) swelling of the air tubes (bronchi) in the lungs. Acute bronchitis causes these tubes to fill with mucus, which can make it hard to breathe. It can also cause coughing or wheezing. In adults, acute bronchitis usually goes away within 2 weeks. A cough caused by bronchitis may last up to 3 weeks. Smoking, allergies, and asthma can make the condition worse. Repeated episodes of bronchitis may cause further lung problems, such as chronic obstructive pulmonary disease (COPD). What are the causes? This condition can be caused by germs and by substances that irritate the lungs, including:  Cold and flu viruses. This condition is most often caused by the same virus that causes a cold.  Bacteria.  Exposure to tobacco smoke, dust, fumes, and air pollution. What increases the risk? This condition is more likely to develop in people  who:  Have close contact with someone with acute bronchitis.  Are exposed to lung irritants, such as tobacco smoke, dust, fumes, and vapors.  Have a weak immune system.  Have a respiratory condition such as asthma. What are the signs or symptoms? Symptoms of this condition include:  A cough.  Coughing up clear, yellow, or green mucus.  Wheezing.  Chest congestion.  Shortness of breath.  A fever.  Body aches.  Chills.  A sore throat. How is this diagnosed? This condition is usually diagnosed with a physical exam. During the exam, your health care provider may order tests, such as chest X-rays, to rule out other conditions. He or she may also:  Test a sample of your mucus for bacterial infection.  Check the level of oxygen in your blood. This is done to check for pneumonia.  Do a chest X-ray or lung function testing to rule out pneumonia and other conditions.  Perform blood tests. Your health care provider  will also ask about your symptoms and medical history. How is this treated? Most cases of acute bronchitis clear up over time without treatment. Your health care provider may recommend:  Drinking more fluids. Drinking more makes your mucus thinner, which may make it easier to breathe.  Taking a medicine for a fever or cough.  Taking an antibiotic medicine.  Using an inhaler to help improve shortness of breath and to control a cough.  Using a cool mist vaporizer or humidifier to make it easier to breathe. Follow these instructions at home: Medicines   Take over-the-counter and prescription medicines only as told by your health care provider.  If you were prescribed an antibiotic, take it as told by your health care provider. Do not stop taking the antibiotic even if you start to feel better. General instructions   Get plenty of rest.  Drink enough fluids to keep your urine clear or pale yellow.  Avoid smoking and secondhand smoke. Exposure to cigarette smoke or irritating chemicals will make bronchitis worse. If you smoke and you need help quitting, ask your health care provider. Quitting smoking will help your lungs heal faster.  Use an inhaler, cool mist vaporizer, or humidifier as told by your health care provider.  Keep all follow-up visits as told by your health care provider. This is important. How is this prevented? To lower your risk of getting this condition again:  Wash your hands often with soap and water. If soap and water are not available, use hand sanitizer.  Avoid contact with people who have cold symptoms.  Try not to touch your hands to your mouth, nose, or eyes.  Make sure to get the flu shot every year. Contact a health care provider if:  Your symptoms do not improve in 2 weeks of treatment. Get help right away if:  You cough up blood.  You have chest pain.  You have severe shortness of breath.  You become dehydrated.  You faint or keep feeling  like you are going to faint.  You keep vomiting.  You have a severe headache.  Your fever or chills gets worse. This information is not intended to replace advice given to you by your health care provider. Make sure you discuss any questions you have with your health care provider. Document Released: 02/07/2004 Document Revised: 07/25/2015 Document Reviewed: 06/20/2015 Elsevier Interactive Patient Education  2017 [employer].

## 2016-05-20 ENCOUNTER — Encounter: Payer: Self-pay | Admitting: Registered Nurse

## 2016-05-20 ENCOUNTER — Telehealth: Payer: Self-pay | Admitting: Registered Nurse

## 2016-05-20 MED ORDER — AMOXICILLIN-POT CLAVULANATE 875-125 MG PO TABS: 1.0000 | ORAL_TABLET | Freq: Two times a day (BID) | ORAL | 0 refills | Status: AC

## 2016-05-20 NOTE — Telephone Encounter (Signed)
Patient contacted clinic that he has missed a couple days of work this week.  Finished azithromycin, has been taking prednisone taper as directed on day #5 and using albuterol inhaler multiple times per day.   Still having wheezing, productive cough clear mucous, fatigued, feeling like it has moved down into chest, ears fullness hot/cold flashes and sweats.  Patient not at work today and unable to come to clinic.  Clinic closed tomorrow.  Patient also started singulair 10mg  po qhs last week.  Patient wanting to know if he needs another antibiotic.  Upon chart review patient typically has had augmentin, azithromycin and prednisone to clear cough/chest congestion/otitis media in past year.  Per up to date [place] guidelines will add augmentin 875mg  po BID x 10 days #20 RF0 dispensed from [facility] to patient spouse to bring home to patient.   Held levaquin at this time due to prednisone taper increased risk of seizures, hyperglycemia and tendon rupture per epocrates. Patient to follow up with PCM/[facility]/ER if difficulty breathing, hemoptysis, SOB and if no improvement/no worsening with augmentin follow up clinic 05/22/2016 for re-evaluation.  Patient verbalized understanding information/instructions, agreed with plan of care and had no further questions at this time.

## 2016-05-22 ENCOUNTER — Ambulatory Visit: Payer: Self-pay | Admitting: Registered Nurse

## 2016-05-22 VITALS — BP 138/91 | HR 82 | Temp 98.2°F

## 2016-05-22 DIAGNOSIS — H6593 Unspecified nonsuppurative otitis media, bilateral: Secondary | ICD-10-CM

## 2016-05-22 DIAGNOSIS — J0101 Acute recurrent maxillary sinusitis: Secondary | ICD-10-CM

## 2016-05-22 DIAGNOSIS — J209 Acute bronchitis, unspecified: Secondary | ICD-10-CM

## 2016-05-22 NOTE — Patient Instructions (Signed)
Community-Acquired Pneumonia, Adult Pneumonia is an infection of the lungs. There are different types of pneumonia. One type can develop while a person is in a hospital. A different type, called community-acquired pneumonia, develops in people who are not, or have not recently been, in the hospital or other health care facility. What are the causes? Pneumonia may be caused by bacteria, viruses, or funguses. Community-acquired pneumonia is often caused by Streptococcus pneumonia bacteria. These bacteria are often passed from one person to another by breathing in droplets from the cough or sneeze of an infected person. What increases the risk? The condition is more likely to develop in:  People who havechronic diseases, such as chronic obstructive pulmonary disease (COPD), asthma, congestive heart failure, cystic fibrosis, diabetes, or kidney disease.  People who haveearly-stage or late-stage HIV.  People who havesickle cell disease.  People who havehad their spleen removed (splenectomy).  People who havepoor Human resources officer.  People who havemedical conditions that increase the risk of breathing in (aspirating) secretions their own mouth and nose.  People who havea weakened immune system (immunocompromised).  People who smoke.  People whotravel to areas where pneumonia-causing germs commonly exist.  People whoare around animal habitats or animals that have pneumonia-causing germs, including birds, bats, rabbits, cats, and farm animals. What are the signs or symptoms? Symptoms of this condition include:  Adry cough.  A wet (productive) cough.  Fever.  Sweating.  Chest pain, especially when breathing deeply or coughing.  Rapid breathing or difficulty breathing.  Shortness of breath.  Shaking chills.  Fatigue.  Muscle aches. How is this diagnosed? Your health care provider will take a medical history and perform a physical exam. You may also have other tests,  including:  Imaging studies of your chest, including X-rays.  Tests to check your blood oxygen level and other blood gases.  Other tests on blood, mucus (sputum), fluid around your lungs (pleural fluid), and urine. If your pneumonia is severe, other tests may be done to identify the specific cause of your illness. How is this treated? The type of treatment that you receive depends on many factors, such as the cause of your pneumonia, the medicines you take, and other medical conditions that you have. For most adults, treatment and recovery from pneumonia may occur at home. In some cases, treatment must happen in a hospital. Treatment may include:  Antibiotic medicines, if the pneumonia was caused by bacteria.  Antiviral medicines, if the pneumonia was caused by a virus.  Medicines that are given by mouth or through an IV tube.  Oxygen.  Respiratory therapy. Although rare, treating severe pneumonia may include:  Mechanical ventilation. This is done if you are not breathing well on your own and you cannot maintain a safe blood oxygen level.  Thoracentesis. This procedureremoves fluid around one lung or both lungs to help you breathe better. Follow these instructions at home:  Take over-the-counter and prescription medicines only as told by your health care provider.  Only takecough medicine if you are losing sleep. Understand that cough medicine can prevent your body's natural ability to remove mucus from your lungs.  If you were prescribed an antibiotic medicine, take it as told by your health care provider. Do not stop taking the antibiotic even if you start to feel better.  Sleep in a semi-upright position at night. Try sleeping in a reclining chair, or place a few pillows under your head.  Do not use tobacco products, including cigarettes, chewing tobacco, and e-cigarettes. If you  need help quitting, ask your health care provider.  Drink enough water to keep your urine clear  or pale yellow. This will help to thin out mucus secretions in your lungs. How is this prevented? There are ways that you can decrease your risk of developing community-acquired pneumonia. Consider getting a pneumococcal vaccine if:  You are older than 51 years of age.  You are older than 52 years of age and are undergoing cancer treatment, have chronic lung disease, or have other medical conditions that affect your immune system. Ask your health care provider if this applies to you. There are different types and schedules of pneumococcal vaccines. Ask your health care provider which vaccination option is best for you. You may also prevent community-acquired pneumonia if you take these actions:  Get an influenza vaccine every year. Ask your health care provider which type of influenza vaccine is best for you.  Go to the dentist on a regular basis.  Wash your hands often. Use hand sanitizer if soap and water are not available. Contact a health care provider if:  You have a fever.  You are losing sleep because you cannot control your cough with cough medicine. Get help right away if:  You have worsening shortness of breath.  You have increased chest pain.  Your sickness becomes worse, especially if you are an older adult or have a weakened immune system.  You cough up blood. This information is not intended to replace advice given to you by your health care provider. Make sure you discuss any questions you have with your health care provider. Document Released: 12/30/2004 Document Revised: 05/10/2015 Document Reviewed: 04/26/2014 Elsevier Interactive Patient Education  2017 [employer] Sinusitis, Adult Sinusitis is soreness and inflammation of your sinuses. Sinuses are hollow spaces in the bones around your face. Your sinuses are located:  Around your eyes.  In the middle of your forehead.  Behind your nose.  In your cheekbones. Your sinuses and nasal passages are lined  with a stringy fluid (mucus). Mucus normally drains out of your sinuses. When your nasal tissues become inflamed or swollen, the mucus can become trapped or blocked so air cannot flow through your sinuses. This allows bacteria, viruses, and funguses to grow, which leads to infection. Sinusitis can develop quickly and last for 7?10 days (acute) or for more than 12 weeks (chronic). Sinusitis often develops after a cold. What are the causes? This condition is caused by anything that creates swelling in the sinuses or stops mucus from draining, including:  Allergies.  Asthma.  Bacterial or viral infection.  Abnormally shaped bones between the nasal passages.  Nasal growths that contain mucus (nasal polyps).  Narrow sinus openings.  Pollutants, such as chemicals or irritants in the air.  A foreign object stuck in the nose.  A fungal infection. This is rare. What increases the risk? The following factors may make you more likely to develop this condition:  Having allergies or asthma.  Having had a recent cold or respiratory tract infection.  Having structural deformities or blockages in your nose or sinuses.  Having a weak immune system.  Doing a lot of swimming or diving.  Overusing nasal sprays.  Smoking. What are the signs or symptoms? The main symptoms of this condition are pain and a feeling of pressure around the affected sinuses. Other symptoms include:  Upper toothache.  Earache.  Headache.  Bad breath.  Decreased sense of smell and taste.  A cough that may get worse at  night.  Fatigue.  Fever.  Thick drainage from your nose. The drainage is often green and it may contain pus (purulent).  Stuffy nose or congestion.  Postnasal drip. This is when extra mucus collects in the throat or back of the nose.  Swelling and warmth over the affected sinuses.  Sore throat.  Sensitivity to light. How is this diagnosed? This condition is diagnosed based on  symptoms, a medical history, and a physical exam. To find out if your condition is acute or chronic, your health care provider may:  Look in your nose for signs of nasal polyps.  Tap over the affected sinus to check for signs of infection.  View the inside of your sinuses using an imaging device that has a light attached (endoscope). If your health care provider suspects that you have chronic sinusitis, you may also:  Be tested for allergies.  Have a sample of mucus taken from your nose (nasal culture) and checked for bacteria.  Have a mucus sample examined to see if your sinusitis is related to an allergy. If your sinusitis does not respond to treatment and it lasts longer than 8 weeks, you may have an MRI or CT scan to check your sinuses. These scans also help to determine how severe your infection is. In rare cases, a bone biopsy may be done to rule out more serious types of fungal sinus disease. How is this treated? Treatment for sinusitis depends on the cause and whether your condition is chronic or acute. If a virus is causing your sinusitis, your symptoms will go away on their own within 10 days. You may be given medicines to relieve your symptoms, including:  Topical nasal decongestants. They shrink swollen nasal passages and let mucus drain from your sinuses.  Antihistamines. These drugs block inflammation that is triggered by allergies. This can help to ease swelling in your nose and sinuses.  Topical nasal corticosteroids. These are nasal sprays that ease inflammation and swelling in your nose and sinuses.  Nasal saline washes. These rinses can help to get rid of thick mucus in your nose. If your condition is caused by bacteria, you will be given an antibiotic medicine. If your condition is caused by a fungus, you will be given an antifungal medicine. Surgery may be needed to correct underlying conditions, such as narrow nasal passages. Surgery may also be needed to remove  polyps. Follow these instructions at home: Medicines   Take, use, or apply over-the-counter and prescription medicines only as told by your health care provider. These may include nasal sprays.  If you were prescribed an antibiotic medicine, take it as told by your health care provider. Do not stop taking the antibiotic even if you start to feel better. Hydrate and Humidify   Drink enough water to keep your urine clear or pale yellow. Staying hydrated will help to thin your mucus.  Use a cool mist humidifier to keep the humidity level in your home above 50%.  Inhale steam for 10-15 minutes, 3-4 times a day or as told by your health care provider. You can do this in the bathroom while a hot shower is running.  Limit your exposure to cool or dry air. Rest   Rest as much as possible.  Sleep with your head raised (elevated).  Make sure to get enough sleep each night. General instructions   Apply a warm, moist washcloth to your face 3-4 times a day or as told by your health care provider. This will  help with discomfort.  Wash your hands often with soap and water to reduce your exposure to viruses and other germs. If soap and water are not available, use hand sanitizer.  Do not smoke. Avoid being around people who are smoking (secondhand smoke).  Keep all follow-up visits as told by your health care provider. This is important. Contact a health care provider if:  You have a fever.  Your symptoms get worse.  Your symptoms do not improve within 10 days. Get help right away if:  You have a severe headache.  You have persistent vomiting.  You have pain or swelling around your face or eyes.  You have vision problems.  You develop confusion.  Your neck is stiff.  You have trouble breathing. This information is not intended to replace advice given to you by your health care provider. Make sure you discuss any questions you have with your health care provider. Document  Released: 12/30/2004 Document Revised: 08/26/2015 Document Reviewed: 10/25/2014 Elsevier Interactive Patient Education  2017 [employer] Acute Bronchitis, Adult Acute bronchitis is sudden (acute) swelling of the air tubes (bronchi) in the lungs. Acute bronchitis causes these tubes to fill with mucus, which can make it hard to breathe. It can also cause coughing or wheezing. In adults, acute bronchitis usually goes away within 2 weeks. A cough caused by bronchitis may last up to 3 weeks. Smoking, allergies, and asthma can make the condition worse. Repeated episodes of bronchitis may cause further lung problems, such as chronic obstructive pulmonary disease (COPD). What are the causes? This condition can be caused by germs and by substances that irritate the lungs, including:  Cold and flu viruses. This condition is most often caused by the same virus that causes a cold.  Bacteria.  Exposure to tobacco smoke, dust, fumes, and air pollution. What increases the risk? This condition is more likely to develop in people who:  Have close contact with someone with acute bronchitis.  Are exposed to lung irritants, such as tobacco smoke, dust, fumes, and vapors.  Have a weak immune system.  Have a respiratory condition such as asthma. What are the signs or symptoms? Symptoms of this condition include:  A cough.  Coughing up clear, yellow, or green mucus.  Wheezing.  Chest congestion.  Shortness of breath.  A fever.  Body aches.  Chills.  A sore throat. How is this diagnosed? This condition is usually diagnosed with a physical exam. During the exam, your health care provider may order tests, such as chest X-rays, to rule out other conditions. He or she may also:  Test a sample of your mucus for bacterial infection.  Check the level of oxygen in your blood. This is done to check for pneumonia.  Do a chest X-ray or lung function testing to rule out pneumonia and other  conditions.  Perform blood tests. Your health care provider will also ask about your symptoms and medical history. How is this treated? Most cases of acute bronchitis clear up over time without treatment. Your health care provider may recommend:  Drinking more fluids. Drinking more makes your mucus thinner, which may make it easier to breathe.  Taking a medicine for a fever or cough.  Taking an antibiotic medicine.  Using an inhaler to help improve shortness of breath and to control a cough.  Using a cool mist vaporizer or humidifier to make it easier to breathe. Follow these instructions at home: Medicines   Take over-the-counter and prescription medicines only as  told by your health care provider.  If you were prescribed an antibiotic, take it as told by your health care provider. Do not stop taking the antibiotic even if you start to feel better. General instructions   Get plenty of rest.  Drink enough fluids to keep your urine clear or pale yellow.  Avoid smoking and secondhand smoke. Exposure to cigarette smoke or irritating chemicals will make bronchitis worse. If you smoke and you need help quitting, ask your health care provider. Quitting smoking will help your lungs heal faster.  Use an inhaler, cool mist vaporizer, or humidifier as told by your health care provider.  Keep all follow-up visits as told by your health care provider. This is important. How is this prevented? To lower your risk of getting this condition again:  Wash your hands often with soap and water. If soap and water are not available, use hand sanitizer.  Avoid contact with people who have cold symptoms.  Try not to touch your hands to your mouth, nose, or eyes.  Make sure to get the flu shot every year. Contact a health care provider if:  Your symptoms do not improve in 2 weeks of treatment. Get help right away if:  You cough up blood.  You have chest pain.  You have severe shortness of  breath.  You become dehydrated.  You faint or keep feeling like you are going to faint.  You keep vomiting.  You have a severe headache.  Your fever or chills gets worse. This information is not intended to replace advice given to you by your health care provider. Make sure you discuss any questions you have with your health care provider. Document Released: 02/07/2004 Document Revised: 07/25/2015 Document Reviewed: 06/20/2015 Elsevier Interactive Patient Education  2017 [employer].

## 2016-05-22 NOTE — Progress Notes (Signed)
Subjective:    Patient ID: Rodney Cisneros, male    DOB: [birth year], 51 y.o.   MRN: [id number]  51y/o married caucasian male here for re-evaluation teeth pain, post nasal drip, headache, sinus pain/pressure, fever/sweats.  Last seen 05/13/2016 completed azithromycin, prednisone taper, singulair, albuterol, flonase, nasal saline, claritin but cough in chest and worsening was given Rx for Augmentin on Tuesday 05/20/16.  Patient reported cough improved using inhaler less.  Last inhaler use this morning.  Cough improved greatly after starting augmentin but still getting occasional coughing fits but hasn't had any episodes of wheezing at work today.  Missed work prior to starting augmentin and had a lot of fatigue beginning of this week.        Review of Systems Constitutional: Positive for diaphoresis, activity change, fatigue, chills and fever. Negative for appetite change, and unexpected weight change.  HENT: Positive for congestion, postnasal drip, rhinorrhea, sinus pain, sinus pressure and sneezing. Negative for dental problem, drooling, ear discharge, ear pain, facial swelling, hearing loss, mouth sores, nosebleeds, sore throat, tinnitus, trouble swallowing and voice change.   Eyes: Negative for photophobia, pain, discharge, redness, itching and visual disturbance.  Respiratory: Positive for cough, wheezing.  Negative for choking, chest tightness, shortness of breath, and stridor.   Cardiovascular: Negative for chest pain, palpitations and leg swelling.  Gastrointestinal: Negative for abdominal distention, abdominal pain, blood in stool, constipation, diarrhea, nausea and vomiting.  Endocrine: Negative for cold intolerance and heat intolerance.  Genitourinary: Negative for dysuria.  Musculoskeletal: Negative for arthralgias, back pain, gait problem, joint swelling, myalgias, neck pain and neck stiffness.  Skin: Negative for color change, pallor, rash and wound.  Allergic/Immunologic:  Positive for environmental allergies. Negative for food allergies and immunocompromised state.  Neurological: Positive for headaches. Negative for dizziness, tremors, seizures, syncope, facial asymmetry, speech difficulty, weakness, light-headedness and numbness.  Hematological: Negative for adenopathy. Does not bruise/bleed easily.  Psychiatric/Behavioral: Negative for agitation, behavioral problems, confusion.  Positive sleep disturbance.      Objective:   Physical Exam Constitutional: He is oriented to person, place, and time. Vital signs are normal. He appears well-developed and well-nourished. He is active and cooperative.  Non-toxic appearance. He does not have a sickly appearance. He appears ill. No distress.  HENT:  Head: Normocephalic and atraumatic.  Right Ear: Hearing, external ear and ear canal normal. A middle ear effusion is present.  Left Ear: Hearing, external ear and ear canal normal. A middle ear effusion is present.  Nose: Mucosal edema and rhinorrhea present. No nose lacerations, sinus tenderness, nasal deformity, septal deviation or nasal septal hematoma. No epistaxis.  No foreign bodies. Mouth/Throat: Uvula is midline and mucous membranes are normal. Mucous membranes are not pale, not dry and not cyanotic. He does not have dentures. No oral lesions. No trismus in the jaw. Normal dentition. No dental abscesses, uvula swelling, lacerations or dental caries. Posterior oropharyngeal edema and posterior oropharyngeal erythema present. No oropharyngeal exudate or tonsillar abscesses.  Bilateral TMs air fluid level clear; bilateral nasal turbinates edema/erythema clear discharge; bilateral allergic shiners; cobblestoning posterior pharynx; sinuses not TTP bilaterally  Eyes: Conjunctivae, EOM and lids are normal. Pupils are equal, round, and reactive to light. Right eye exhibits no chemosis, no discharge, no exudate and no hordeolum. No foreign body present in the right eye. Left eye  exhibits no chemosis, no discharge, no exudate and no hordeolum. No foreign body present in the left eye. Right conjunctiva is not injected. Right conjunctiva has no hemorrhage. Left  conjunctiva is not injected. Left conjunctiva has no hemorrhage. No scleral icterus. Right eye exhibits normal extraocular motion and no nystagmus. Left eye exhibits normal extraocular motion and no nystagmus. Right pupil is round and reactive. Left pupil is round and reactive. Pupils are equal.  Neck: Trachea normal, normal range of motion and phonation normal. Neck supple. No tracheal tenderness, no spinous process tenderness and no muscular tenderness present. No neck rigidity. No tracheal deviation, no edema, no erythema and normal range of motion present. No thyroid mass and no thyromegaly present.  Cardiovascular: Normal rate, regular rhythm, S1 normal, S2 normal, normal heart sounds and intact distal pulses.  PMI is not displaced.  Exam reveals no gallop and no friction rub.   No murmur heard. Pulmonary/Chest: Effort normal and breath sounds normal. No stridor. No respiratory distress. He has no decreased breath sounds. He has no wheezes. He has no rhonchi. He has no rales.  Nonproductive intermittent cough observed in exam room; speaks full sentences without difficulty  Abdominal: Soft. He exhibits no distension.  Musculoskeletal: Normal range of motion. He exhibits no edema or tenderness.       Right shoulder: Normal.       Left shoulder: Normal.       Right elbow: Normal.      Left elbow: Normal.       Right hip: Normal.       Left hip: Normal.       Right knee: Normal.       Left knee: Normal.       Cervical back: Normal.       Thoracic back: Normal.       Lumbar back: Normal.  Lymphadenopathy:       Head (right side): No submental, no submandibular, no tonsillar, no preauricular, no posterior auricular and no occipital adenopathy present.       Head (left side): No submental, no submandibular, no  tonsillar, no preauricular, no posterior auricular and no occipital adenopathy present.    He has no cervical adenopathy.       Right cervical: No superficial cervical, no deep cervical and no posterior cervical adenopathy present.      Left cervical: No superficial cervical, no deep cervical and no posterior cervical adenopathy present.  Neurological: He is alert and oriented to person, place, and time. He has normal strength. He is not disoriented. He displays no atrophy and no tremor. No cranial nerve deficit or sensory deficit. He exhibits normal muscle tone. He displays no seizure activity. Coordination and gait normal. GCS eye subscore is 4. GCS verbal subscore is 5. GCS motor subscore is 6.  On/off exam table; in/out chair without difficulty; bilateral hand grasp 5/5 equal; gait sure and steady hall  Skin: Skin is warm and intact. No abrasion, no bruising, no burn, no ecchymosis, no laceration, no lesion, no petechiae and no rash noted. He is diaphoretic. No cyanosis or erythema. No pallor. Nails show no clubbing.  Psychiatric: He has a normal mood and affect. His speech is normal and behavior is normal. Judgment and thought content normal. Cognition and memory are normal.  Nursing note and vitals reviewed.      Assessment & Plan:  A-recurrent maxillary sinusitis acute; acute bronchitis, seasonal allergic rhinitis and bilateral otitis media effusion  P-finish augmentin 875mg  po BID x 10 days dispensed from [facility] on 05/20/2016.  Continue albuterol MDI 1-2puffs po q4-6h prn protracted cough/wheezing at home.  Tylenol 650-1000mg  po q6h prn pain/fever OTC.  Bronchitis  simple, community acquired, may have started as viral (probably respiratory syncytial, parainfluenza, influenza, or adenovirus), but now evidence of acute purulent bronchitis with resultant bronchial edema and mucus formation.  Viruses are the most common cause of bronchial inflammation in otherwise healthy adults with acute  bronchitis.  The appearance of sputum is not predictive of whether a bacterial infection is present.  Purulent sputum is most often caused by viral infections.  There are a small portion of those caused by non-viral agents being Mycoplamsa pneumonia.  Microscopic examination or C&S of sputum in the healthy adult with acute bronchitis is generally not helpful (usually negative or normal respiratory flora) other considerations being cough from upper respiratory tract Bronchitis infections, sinusitis or allergic syndromes (mild asthma or viral pneumonia).  Differential Diagnosis:  reactive airway disease (asthma, allergic aspergillosis (eosinophilia), chronic bronchitis, respiratory infection (Sinusitis, Common cold, pneumonia), congestive heart failure, reflux esophagitis, bronchogenic tumor, aspiration syndromes and/or exposure irritants/tobacco smoke.  In this case, there is no evidence of any invasive bacterial illness.  Most likely viral etiology so will hold on antibiotic treatment.  Advise supportive care with rest, encourage fluids, good hygiene and watch for any worsening symptoms.  If they were to develop:  come back to the office or go to the emergency room if after hours. Without high fever, severe dyspnea, lack of physical findings or other risk factors, I will hold on a chest radiograph and CBC at this time. I discussed that approximately 50% of patients with acute bronchitis have a cough that lasts up to three weeks, and 25% for over a month.  Tylenol, one to two tablets every four hours as needed for fever or myalgias.   No aspirin.  Patient instructed to follow up in one week or sooner if symptoms worsen. Patient verbalized agreement and understanding of treatment plan.  P2:  hand washing and cover cough  Patient may use normal saline nasal spray as needed.  Continue antihistamine and nasal steroid use.  Avoid triggers if possible.  Shower prior to bedtime if exposed to triggers.  If allergic  dust/dust mites recommend mattress/pillow covers/encasements; washing linens, vacuuming, sweeping, dusting weekly.  Call or return to clinic as needed if these symptoms worsen or fail to improve as anticipated.   Exitcare handout on allergic rhinitis given to patient.  Patient verbalized understanding of instructions, agreed with plan of care and had no further questions at this time.  P2:  Avoidance and hand washing.   Supportive treatment.   No evidence of invasive bacterial infection, non toxic and well hydrated.  This is most likely self limiting viral infection.  I do not see where any further testing or imaging is necessary at this time.   I will suggest supportive care, rest, good hygiene and encourage the patient to take adequate fluids.  The patient is to return to clinic or EMERGENCY ROOM if symptoms worsen or change significantly e.g. ear pain, fever, purulent discharge from ears or bleeding.  Exitcare handout on otitis media with effusion given to patient.  Patient verbalized agreement and understanding of treatment plan.    Finish augmentin.  Continue flonase, nasal saline, singulair and OTC antihistamine daily.  No evidence of systemic bacterial infection, non toxic and well hydrated.  I do not see where any further testing or imaging is necessary at this time.   I will suggest supportive care, rest, good hygiene and encourage the patient to take adequate fluids.  The patient is to return to clinic or EMERGENCY  ROOM if symptoms worsen or change significantly.  Exitcare handout on sinusitis given to patient.  Patient verbalized agreement and understanding of treatment plan and had no further questions at this time.   P2:  Hand washing and cover cough

## 2016-07-22 ENCOUNTER — Ambulatory Visit: Payer: Self-pay | Admitting: Registered Nurse

## 2016-07-22 VITALS — BP 120/90 | HR 83 | Temp 98.4°F

## 2016-07-22 DIAGNOSIS — H65112 Acute and subacute allergic otitis media (mucoid) (sanguinous) (serous), left ear: Secondary | ICD-10-CM

## 2016-07-22 DIAGNOSIS — J301 Allergic rhinitis due to pollen: Secondary | ICD-10-CM

## 2016-07-22 NOTE — Progress Notes (Signed)
Subjective:    Patient ID: Rodney Cisneros, male    DOB: Jan 27, 1965, 51 y.o.   MRN: [id number]  51y/o married caucasian male established patient reports freezing off a wart on L neck with an OTC medication stick 2 weeks ago. RN saw pt a few days afterward and site was progressing as expected turning grayish and beginning to slough. Now with constant pain to L neck extending up to L ear. No change in pain with neck movement or positioning.      Review of Systems  Constitutional: Positive for fever. Negative for activity change, appetite change, chills, diaphoresis, fatigue and unexpected weight change.  HENT: Positive for congestion, ear pain, postnasal drip, rhinorrhea, sinus pain, sinus pressure and sore throat. Negative for dental problem, drooling, ear discharge, facial swelling, hearing loss, mouth sores, nosebleeds, sneezing, tinnitus, trouble swallowing and voice change.   Eyes: Negative for photophobia, pain, discharge, redness, itching and visual disturbance.  Respiratory: Positive for cough. Negative for choking, chest tightness, shortness of breath, wheezing and stridor.   Cardiovascular: Negative for chest pain, palpitations and leg swelling.  Gastrointestinal: Negative for abdominal distention, abdominal pain, blood in stool, constipation, diarrhea, nausea and vomiting.  Endocrine: Negative for cold intolerance and heat intolerance.  Genitourinary: Negative for dysuria.  Musculoskeletal: Negative for arthralgias, back pain, gait problem, joint swelling, myalgias, neck pain and neck stiffness.  Skin: Negative for color change, pallor, rash and wound.  Allergic/Immunologic: Positive for environmental allergies. Negative for food allergies and immunocompromised state.  Neurological: Negative for dizziness, tremors, seizures, syncope, facial asymmetry, speech difficulty, weakness, light-headedness, numbness and headaches.  Hematological: Negative for adenopathy. Does not  bruise/bleed easily.  Psychiatric/Behavioral: Negative for agitation, behavioral problems, confusion and sleep disturbance.       Objective:   Physical Exam  Constitutional: He is oriented to person, place, and time. Vital signs are normal. He appears well-developed and well-nourished. He is active and cooperative.  Non-toxic appearance. He does not have a sickly appearance. He appears ill. No distress.  HENT:  Head: Normocephalic and atraumatic.  Right Ear: Hearing, external ear and ear canal normal. A middle ear effusion is present.  Left Ear: Hearing, external ear and ear canal normal. Tympanic membrane is erythematous and bulging. A middle ear effusion is present.  Nose: Mucosal edema and rhinorrhea present. No nose lacerations, sinus tenderness, nasal deformity, septal deviation or nasal septal hematoma. No epistaxis.  No foreign bodies. Right sinus exhibits no maxillary sinus tenderness and no frontal sinus tenderness. Left sinus exhibits no maxillary sinus tenderness and no frontal sinus tenderness.  Mouth/Throat: Uvula is midline and mucous membranes are normal. Mucous membranes are not pale, not dry and not cyanotic. He does not have dentures. No oral lesions. No trismus in the jaw. Normal dentition. No dental abscesses, uvula swelling, lacerations or dental caries. Posterior oropharyngeal edema and posterior oropharyngeal erythema present. No oropharyngeal exudate or tonsillar abscesses.  Worsening eyelid lower swelling nonpitting 1-2+/4 bilaterally; bilateral allergic shiners; cobblestoning posterior pharynx; bilateral TMs air fluid level clear slight erythema  Eyes: Conjunctivae, EOM and lids are normal. Pupils are equal, round, and reactive to light. Right eye exhibits no chemosis, no discharge, no exudate and no hordeolum. No foreign body present in the right eye. Left eye exhibits no chemosis, no discharge, no exudate and no hordeolum. No foreign body present in the left eye. Right  conjunctiva is not injected. Right conjunctiva has no hemorrhage. Left conjunctiva is not injected. Left conjunctiva has no hemorrhage. No  scleral icterus. Right eye exhibits normal extraocular motion and no nystagmus. Left eye exhibits normal extraocular motion and no nystagmus. Right pupil is round and reactive. Left pupil is round and reactive. Pupils are equal.  Neck: Trachea normal and normal range of motion. Neck supple. No tracheal tenderness, no spinous process tenderness and no muscular tenderness present. No neck rigidity. No tracheal deviation, no edema, no erythema and normal range of motion present. No thyroid mass and no thyromegaly present.  Cardiovascular: Normal rate, regular rhythm, S1 normal, S2 normal, normal heart sounds and intact distal pulses.  PMI is not displaced.  Exam reveals no gallop and no friction rub.   No murmur heard. Pulmonary/Chest: Effort normal and breath sounds normal. No stridor. No respiratory distress. He has no decreased breath sounds. He has no wheezes. He has no rhonchi. He has no rales.  Abdominal: Soft. He exhibits no distension.  Musculoskeletal: Normal range of motion. He exhibits no edema or tenderness.  Lymphadenopathy:       Head (right side): No submental, no submandibular, no tonsillar, no preauricular, no posterior auricular and no occipital adenopathy present.       Head (left side): No submental, no submandibular, no tonsillar, no preauricular, no posterior auricular and no occipital adenopathy present.    He has no cervical adenopathy.       Right cervical: No superficial cervical, no deep cervical and no posterior cervical adenopathy present.      Left cervical: No superficial cervical, no deep cervical and no posterior cervical adenopathy present.  Neurological: He is alert and oriented to person, place, and time. He displays no atrophy and no tremor. No cranial nerve deficit or sensory deficit. He exhibits normal muscle tone. He displays no  seizure activity. Coordination and gait normal. GCS eye subscore is 4. GCS verbal subscore is 5. GCS motor subscore is 6.  Skin: Skin is warm, dry and intact. No abrasion, no bruising, no burn, no ecchymosis, no laceration, no lesion, no petechiae and no rash noted. He is not diaphoretic. No cyanosis or erythema. No pallor. Nails show no clubbing.  Psychiatric: He has a normal mood and affect. His speech is normal and behavior is normal. Judgment and thought content normal. Cognition and memory are normal.  Nursing note and vitals reviewed.         Assessment & Plan:  A-otitis media left; otitis media effusion right; seasonal allergic rhinitis acute  P- augmentiin 875mg  po BID x 10 days #20 RF0 dispensed from [facility]  Given 6 UD tylenol 1000mg  po QID prn pain from clinic stock  Treatment as ordered.  Symptomatic therapy suggested fluids, NSAIDs and rest.  May take Tylenol for pain/fever.  Discussed motrin counteracts his blood pressure medication.  Call or return to clinic as needed if these symptoms worsen or fail to improve as anticipated. Exitcare handout on otitis media given to patient.  Patient verbalized agreement and understanding of treatment plan.   P2:  Hand washing  Supportive treatment.   No evidence of invasive bacterial infection, non toxic and well hydrated.  This is most likely self limiting viral infection.  I do not see where any further testing or imaging is necessary at this time.   I will suggest supportive care, rest, good hygiene and encourage the patient to take adequate fluids.  The patient is to return to clinic or EMERGENCY ROOM if symptoms worsen or change significantly e.g. ear pain, fever, purulent discharge from ears or bleeding.  Exitcare handout  on otitis media with effusion given to patient.  Patient verbalized agreement and understanding of treatment plan.    Patient given nasal saline 1 bottle from clinic stock.   Patient may use normal saline nasal spray 2 sprays  each nostril q2h wa as needed.  Continue claritin 10mg  po daily and flonase 1 spray each nostril BID at home along with singulair 10mg  po qhs.  If no improvement will consider course of steroids, reiterated showering prior to bedtime and Avoid triggers if possible and using saline more often if exposed to a lot of dust/allergens during day at work consider mask.  Shower prior to bedtime if exposed to triggers.  If allergic dust/dust mites recommend mattress/pillow covers/encasements; washing linens, vacuuming, sweeping, dusting weekly.  Call or return to clinic as needed if these symptoms worsen or fail to improve as anticipated.   Exitcare handout on allergic rhinitis given to patient.  Patient verbalized understanding of instructions, agreed with plan of care and had no further questions at this time.  P2:  Avoidance and hand washing.

## 2016-07-24 MED ORDER — AMOXICILLIN-POT CLAVULANATE 875-125 MG PO TABS: 1.0000 | ORAL_TABLET | Freq: Two times a day (BID) | ORAL | 0 refills | Status: AC

## 2016-07-24 NOTE — Patient Instructions (Addendum)
Allergic Rhinitis Allergic rhinitis is when the mucous membranes in the nose respond to allergens. Allergens are particles in the air that cause your body to have an allergic reaction. This causes you to release allergic antibodies. Through a chain of events, these eventually cause you to release histamine into the blood stream. Although meant to protect the body, it is this release of histamine that causes your discomfort, such as frequent sneezing, congestion, and an itchy, runny nose. What are the causes? Seasonal allergic rhinitis (hay fever) is caused by pollen allergens that may come from grasses, trees, and weeds. Year-round allergic rhinitis (perennial allergic rhinitis) is caused by allergens such as house dust mites, pet dander, and mold spores. What are the signs or symptoms?  Nasal stuffiness (congestion).  Itchy, runny nose with sneezing and tearing of the eyes. How is this diagnosed? Otitis Media With Effusion, Pediatric Otitis media with effusion (OME) occurs when there is inflammation of the middle ear and fluid in the middle ear space. There are no signs and symptoms of infection. The middle ear space contains air and the bones for hearing. Air in the middle ear space helps to transmit sound to the brain. OME is a common condition in children, and it often occurs after an ear infection. This condition may be present for several weeks or longer after an ear infection. Most cases of this condition get better on their own. What are the causes? OME is caused by a blockage of the eustachian tube in one or both ears. These tubes drain fluid in the ears to the back of the nose (nasopharynx). If the tissue in the tube swells up (edema), the tube closes. This prevents fluid from draining. Blockage can be caused by:  Ear infections.  Colds and other upper respiratory infections.  Allergies.  Irritants, such as tobacco smoke.  Enlarged adenoids. The adenoids are areas of soft tissue  located high in the back of the throat, behind the nose and the roof of the mouth. They are part of the body's natural defense (immune) system.  A mass in the nasopharynx.  Damage to the ear caused by pressure changes (barotrauma).  What increases the risk? Your child is more likely to develop this condition if:  He or she has repeated ear and sinus infections.  He or she has allergies.  He or she is exposed to tobacco smoke.  He or she attends daycare.  He or she is not breastfed.  What are the signs or symptoms? Symptoms of this condition may not be obvious. Sometimes this condition does not have any symptoms, or symptoms may overlap with those of a cold or upper respiratory tract illness. Symptoms of this condition include:  Temporary hearing loss.  A feeling of fullness in the ear without pain.  Irritability or agitation.  Balance (vestibular) problems.  As a result of hearing loss, your child may:  Listen to the TV at a loud volume.  Not respond to questions.  Ask "What?" often when spoken to.  Mistake or confuse one sound or word for another.  Perform poorly at school.  Have a poor attention span.  Become agitated or irritated easily.  How is this diagnosed? This condition is diagnosed with an ear exam. Your child's health care provider will look inside your child's ear with an instrument (otoscope) to check for redness, swelling, and fluid. Other tests may be done, including:  A test to check the movement of the eardrum (pneumatic otoscopy). This is  done by squeezing a small amount of air into the ear.  A test that changes air pressure in the middle ear to check how well the eardrum moves and to see if the eustachian tube is working (tympanogram).  Hearing test (audiogram). This test involves playing tones at different pitches to see if your child can hear each tone.  How is this treated? Treatment for this condition depends on the cause. In many  cases, the fluid goes away on its own. In some cases, your child may need a procedure to create a hole in the eardrum to allow fluid to drain (myringotomy) and to insert small drainage tubes (tympanostomy tubes) into the eardrums. These tubes help to drain fluid and prevent infection. This procedure may be recommended if:  OME does not get better over several months.  Your child has many ear infections within several months.  Your child has noticeable hearing loss.  Your child has problems with speech and language development.  Surgery may also be done to remove the adenoids (adenoidectomy). Follow these instructions at home:  Give over-the-counter and prescription medicines only as told by your child's health care provider.  Keep children away from any tobacco smoke.  Keep all follow-up visits as told by your child's health care provider. This is important. How is this prevented?  Keep your child's vaccinations up to date. Make sure your child gets all recommended vaccinations, including a pneumonia and flu vaccine.  Encourage hand washing. Your child should wash his or her hands often with soap and water. If there is no soap and water, he or she should use hand sanitizer.  Avoid exposing your child to tobacco smoke.  Breastfeed your baby, if possible. Babies who are breastfed as long as possible are less likely to develop this condition. Contact a health care provider if:  Your child's hearing does not get better after 3 months.  Your child's hearing is worse.  Your child has ear pain.  Your child has a fever.  Your child has drainage from the ear.  Your child is dizzy.  Your child has a lump on his or her neck. Get help right away if:  Your child has bleeding from the nose.  Your child cannot move part of her or his face.  Your child has trouble breathing.  Your child cannot smell.  Your child develops severe congestion.  Your child develops weakness.  Your  child who is younger than 3 months has a temperature of 100F (38C) or higher. Summary  Otitis media with effusion (OME) occurs when there is inflammation of the middle ear and fluid in the middle ear space.  This condition is caused by blockage of one or both eustachian tubes, which drain fluid in the ears to the back of the nose.  Symptoms of this condition can include temporary hearing loss, a feeling of fullness in the ear, irritability or agitation, and balance (vestibular) problems. Sometimes, there are no symptoms.  This condition is diagnosed with an ear exam and tests, such as pneumatic otoscopy, tympanogram, and audiogram.  Treatment for this condition depends on the cause. In many cases, the fluid goes away on its own. This information is not intended to replace advice given to you by your health care provider. Make sure you discuss any questions you have with your health care provider. Document Released: 03/22/2003 Document Revised: 11/22/2015 Document Reviewed: 11/22/2015 Elsevier Interactive Patient Education  2017 [employer] Sinus Rinse What is a sinus rinse? A  sinus rinse is a simple home treatment that is used to rinse your sinuses with a sterile mixture of salt and water (saline solution). Sinuses are air-filled spaces in your skull behind the bones of your face and forehead that open into your nasal cavity. You will use the following:  Saline solution.  Neti pot or spray bottle. This releases the saline solution into your nose and through your sinuses. Neti pots and spray bottles can be purchased at [employer], a health food store, or online.  When would I do a sinus rinse? A sinus rinse can help to clear mucus, dirt, dust, or pollen from the nasal cavity. You may do a sinus rinse when you have a cold, a virus, nasal allergy symptoms, a sinus infection, or stuffiness in the nose or sinuses. If you are considering a sinus rinse:  Ask your child's health  care provider before performing a sinus rinse on your child.  Do not do a sinus rinse if you have had ear or nasal surgery, ear infection, or blocked ears.  How do I do a sinus rinse?  Wash your hands.  Disinfect your device according to the directions provided and then dry it.  Use the solution that comes with your device or one that is sold separately in stores. Follow the mixing directions on the package.  Fill your device with the amount of saline solution as directed by the device instructions.  Stand over a sink and tilt your head sideways over the sink.  Place the spout of the device in your upper nostril (the one closer to the ceiling).  Gently pour or squeeze the saline solution into the nasal cavity. The liquid should drain to the lower nostril if you are not overly congested.  Gently blow your nose. Blowing too hard may cause ear pain.  Repeat in the other nostril.  Clean and rinse your device with clean water and then air-dry it. Are there risks of a sinus rinse? Sinus rinse is generally very safe and effective. However, there are a few risks, which include:  A burning sensation in the sinuses. This may happen if you do not make the saline solution as directed. Make sure to follow all directions when making the saline solution.  Infection from contaminated water. This is rare, but possible.  Nasal irritation.  This information is not intended to replace advice given to you by your health care provider. Make sure you discuss any questions you have with your health care provider. Document Released: 07/27/2013 Document Revised: 11/27/2015 Document Reviewed: 05/17/2013 Elsevier Interactive Patient Education  2017 [employer].  Your health care provider can help you determine the allergen or allergens that trigger your symptoms. If you and your health care provider are unable to determine the allergen, skin or blood testing may be used. Your health care provider will  diagnose your condition after taking your health history and performing a physical exam. Your health care provider may assess you for other related conditions, such as asthma, pink eye, or an ear infection. How is this treated? Allergic rhinitis does not have a cure, but it can be controlled by:  Medicines that block allergy symptoms. These may include allergy shots, nasal sprays, and oral antihistamines.  Avoiding the allergen.  Hay fever may often be treated with antihistamines in pill or nasal spray forms. Antihistamines block the effects of histamine. There are over-the-counter medicines that may help with nasal congestion and swelling around the eyes. Check with your  health care provider before taking or giving this medicine. If avoiding the allergen or the medicine prescribed do not work, there are many new medicines your health care provider can prescribe. Stronger medicine may be used if initial measures are ineffective. Desensitizing injections can be used if medicine and avoidance does not work. Desensitization is when a patient is given ongoing shots until the body becomes less sensitive to the allergen. Make sure you follow up with your health care provider if problems continue. Follow these instructions at home: It is not possible to completely avoid allergens, but you can reduce your symptoms by taking steps to limit your exposure to them. It helps to know exactly what you are allergic to so that you can avoid your specific triggers. Contact a health care provider if:  You have a fever.  You develop a cough that does not stop easily (persistent).  You have shortness of breath.  You start wheezing.  Symptoms interfere with normal daily activities. This information is not intended to replace advice given to you by your health care provider. Make sure you discuss any questions you have with your health care provider. Document Released: 09/24/2000 Document Revised: 08/31/2015  Document Reviewed: 09/06/2012 Elsevier Interactive Patient Education  2017 [employer]. Otitis Media, Adult Otitis media occurs when there is inflammation and fluid in the middle ear. Your middle ear is a part of the ear that contains bones for hearing as well as air that helps send sounds to your brain. What are the causes? This condition is caused by a blockage in the eustachian tube. This tube drains fluid from the ear to the back of the nose (nasopharynx). A blockage in this tube can be caused by an object or by swelling (edema) in the tube. Problems that can cause a blockage include:  A cold or other upper respiratory infection.  Allergies.  An irritant, such as tobacco smoke.  Enlarged adenoids. The adenoids are areas of soft tissue located high in the back of the throat, behind the nose and the roof of the mouth.  A mass in the nasopharynx.  Damage to the ear caused by pressure changes (barotrauma).  What are the signs or symptoms? Symptoms of this condition include:  Ear pain.  A fever.  Decreased hearing.  A headache.  Tiredness (lethargy).  Fluid leaking from the ear.  Ringing in the ear.  How is this diagnosed? This condition is diagnosed with a physical exam. During the exam your health care provider will use an instrument called an otoscope to look into your ear and check for redness, swelling, and fluid. He or she will also ask about your symptoms. Your health care provider may also order tests, such as:  A test to check the movement of the eardrum (pneumatic otoscopy). This test is done by squeezing a small amount of air into the ear.  A test that changes air pressure in the middle ear to check how well the eardrum moves and whether the eustachian tube is working (tympanogram).  How is this treated? This condition usually goes away on its own within 3-5 days. But if the condition is caused by a bacteria infection and does not go away own its own, or keeps  coming back, your health care provider may:  Prescribe antibiotic medicines to treat the infection.  Prescribe or recommend medicines to control pain.  Follow these instructions at home:  Take over-the-counter and prescription medicines only as told by your health care provider.  If you were prescribed an antibiotic medicine, take it as told by your health care provider. Do not stop taking the antibiotic even if you start to feel better.  Keep all follow-up visits as told by your health care provider. This is important. Contact a health care provider if:  You have bleeding from your nose.  There is a lump on your neck.  You are not getting better in 5 days.  You feel worse instead of better. Get help right away if:  You have severe pain that is not controlled with medicine.  You have swelling, redness, or pain around your ear.  You have stiffness in your neck.  A part of your face is paralyzed.  The bone behind your ear (mastoid) is tender when you touch it.  You develop a severe headache. Summary  Otitis media is redness, soreness, and swelling of the middle ear.  This condition usually goes away on its own within 3-5 days.  If the problem does not go away in 3-5 days, your health care provider may prescribe or recommend medicines to treat your symptoms.  If you were prescribed an antibiotic medicine, take it as told by your health care provider. This information is not intended to replace advice given to you by your health care provider. Make sure you discuss any questions you have with your health care provider. Document Released: 10/05/2003 Document Revised: 12/21/2015 Document Reviewed: 12/21/2015 Elsevier Interactive Patient Education  2017 [employer] Sinus Rinse What is a sinus rinse? A sinus rinse is a simple home treatment that is used to rinse your sinuses with a sterile mixture of salt and water (saline solution). Sinuses are air-filled spaces in your  skull behind the bones of your face and forehead that open into your nasal cavity. You will use the following:  Saline solution.  Neti pot or spray bottle. This releases the saline solution into your nose and through your sinuses. Neti pots and spray bottles can be purchased at [employer], a health food store, or online.  When would I do a sinus rinse? A sinus rinse can help to clear mucus, dirt, dust, or pollen from the nasal cavity. You may do a sinus rinse when you have a cold, a virus, nasal allergy symptoms, a sinus infection, or stuffiness in the nose or sinuses. If you are considering a sinus rinse:  Ask your child's health care provider before performing a sinus rinse on your child.  Do not do a sinus rinse if you have had ear or nasal surgery, ear infection, or blocked ears.  How do I do a sinus rinse?  Wash your hands.  Disinfect your device according to the directions provided and then dry it.  Use the solution that comes with your device or one that is sold separately in stores. Follow the mixing directions on the package.  Fill your device with the amount of saline solution as directed by the device instructions.  Stand over a sink and tilt your head sideways over the sink.  Place the spout of the device in your upper nostril (the one closer to the ceiling).  Gently pour or squeeze the saline solution into the nasal cavity. The liquid should drain to the lower nostril if you are not overly congested.  Gently blow your nose. Blowing too hard may cause ear pain.  Repeat in the other nostril.  Clean and rinse your device with clean water and then air-dry it. Are there risks of a  sinus rinse? Sinus rinse is generally very safe and effective. However, there are a few risks, which include:  A burning sensation in the sinuses. This may happen if you do not make the saline solution as directed. Make sure to follow all directions when making the saline  solution.  Infection from contaminated water. This is rare, but possible.  Nasal irritation.  This information is not intended to replace advice given to you by your health care provider. Make sure you discuss any questions you have with your health care provider. Document Released: 07/27/2013 Document Revised: 11/27/2015 Document Reviewed: 05/17/2013 Elsevier Interactive Patient Education  2017 [employer].

## 2016-08-07 ENCOUNTER — Ambulatory Visit: Payer: Self-pay | Admitting: Registered Nurse

## 2016-08-07 VITALS — BP 140/87 | HR 74 | Temp 99.1°F

## 2016-08-07 DIAGNOSIS — B354 Tinea corporis: Secondary | ICD-10-CM

## 2016-08-07 MED ORDER — CLOTRIMAZOLE 1 % EX CREA: 1.0000 "application " | TOPICAL_CREAM | Freq: Two times a day (BID) | CUTANEOUS | 0 refills | Status: AC

## 2016-08-07 NOTE — Progress Notes (Signed)
Subjective:    Patient ID: Rodney Cisneros, male    DOB: 1965-10-29, 51 y.o.   MRN: [id number]  51y/o caucasian married male with rash left lateral posterior lower leg x 3 days ringline itchy has tried steroid cream and calamine without much relief of itching.  Denied fever/chills/discharge.  Has dogs at home.      Review of Systems  Constitutional: Negative for activity change, appetite change, chills, diaphoresis, fatigue and fever.  HENT: Negative for facial swelling, hearing loss, mouth sores, nosebleeds, trouble swallowing and voice change.   Eyes: Negative for discharge.  Respiratory: Negative for cough.   Cardiovascular: Negative for chest pain, palpitations and leg swelling.  Gastrointestinal: Negative for abdominal distention, abdominal pain and vomiting.  Endocrine: Negative for cold intolerance and heat intolerance.  Genitourinary: Negative for difficulty urinating.  Musculoskeletal: Negative for gait problem, joint swelling, myalgias, neck pain and neck stiffness.  Skin: Positive for color change and rash. Negative for pallor and wound.  Allergic/Immunologic: Positive for environmental allergies. Negative for food allergies.  Neurological: Negative for dizziness, tremors, weakness, numbness and headaches.  Hematological: Negative for adenopathy. Does not bruise/bleed easily.  Psychiatric/Behavioral: Negative for agitation, confusion and sleep disturbance.       Objective:   Physical Exam  Constitutional: He is oriented to person, place, and time. Vital signs are normal. He appears well-developed and well-nourished. He is active and cooperative.  Non-toxic appearance. He does not have a sickly appearance. He does not appear ill. No distress.  HENT:  Head: Normocephalic and atraumatic.  Right Ear: Hearing and external ear normal.  Left Ear: Hearing and external ear normal.  Nose: Nose normal.  Mouth/Throat: Uvula is midline, oropharynx is clear and moist and mucous  membranes are normal. No oropharyngeal exudate.  Eyes: Pupils are equal, round, and reactive to light. Conjunctivae, EOM and lids are normal. Right eye exhibits no discharge. Left eye exhibits no discharge. No scleral icterus.  Neck: Trachea normal, normal range of motion and phonation normal. Neck supple. No neck rigidity. No tracheal deviation and normal range of motion present. No thyromegaly present.  Cardiovascular: Normal rate, regular rhythm, normal heart sounds and intact distal pulses.  Exam reveals no gallop and no friction rub.   No murmur heard. Pulmonary/Chest: Effort normal and breath sounds normal. No accessory muscle usage or stridor. No respiratory distress. He has no decreased breath sounds. He has no wheezes. He has no rhonchi. He has no rales. He exhibits no tenderness.  Abdominal: Soft. There is no guarding.  Musculoskeletal: Normal range of motion. He exhibits no edema, tenderness or deformity.       Right shoulder: Normal.       Left shoulder: Normal.       Right elbow: Normal.      Left elbow: Normal.       Right hip: Normal.       Left hip: Normal.       Right knee: Normal.       Left knee: Normal.       Right ankle: Normal.       Left ankle: Normal.       Cervical back: Normal.       Right lower leg: He exhibits no bony tenderness, no swelling, no edema, no deformity and no laceration.       Left lower leg: He exhibits no bony tenderness, no swelling, no edema, no deformity and no laceration.  Bilateral lower extremity varicose veins  Lymphadenopathy:  He has no cervical adenopathy.  Neurological: He is alert and oriented to person, place, and time. He has normal strength. He is not disoriented. He displays no atrophy and no tremor. No cranial nerve deficit or sensory deficit. He exhibits normal muscle tone. He displays no seizure activity. Coordination and gait normal.  On/off exam table; in/out of chair without difficulty; gait sure and steady in hall  Skin:  Skin is warm and dry. Abrasion and rash noted. No bruising, no burn, no ecchymosis, no laceration, no lesion, no petechiae and no purpura noted. Rash is macular, papular and maculopapular. Rash is not nodular, not pustular, not vesicular and not urticarial. He is not diaphoretic. There is erythema. No pallor.     Annular raised leading edge fine scale with central clearing macular erythema left lower extremity (calf/gastrocnemius) 1.5cm diameter; linear abrasions noted through center fine brown scab 1cm length; no increased temperature or tenderness to palpation  Psychiatric: He has a normal mood and affect. His speech is normal and behavior is normal. Judgment and thought content normal. Cognition and memory are normal.  Nursing note and vitals reviewed.         Assessment & Plan:  A-tinea corporis left lower leg  P-Clotrimazole 1% cream apply BID x 2 weeks #1 RF0 electronic Rx to pharmacy of choice.  Shower may use dandruff shampoo to clean area.  Avoid sitting in sweaty clothes.  Launder sheets/towels more often ensure dried completely hot dryer.  May apply vaseline to affected area to help with itching.  Stop steroid cream.  May continue calamine if it helps.  Wash all bedding and clothing hot water at least weekly until infection resolves.  Medication as directed.  Call or return to clinic as needed if these symptoms worsen or fail to improve as anticipated.  Exitcare handout on ringworm  given to patient.  Discussed hygiene with patient and mother e.g. do not stay in sweat soaked clothes and shower with selsun blue shampoo prn.  Do not let pets sleep in bed with family members.  Reoccurrence of condition common and may require retreatment.  Patient verbalized agreement and understanding of treatment plan and had no further questions at this time.   P2:  Avoidance and hand washing.

## 2016-08-07 NOTE — Patient Instructions (Signed)

## 2016-08-08 ENCOUNTER — Ambulatory Visit: Payer: Self-pay | Admitting: *Deleted

## 2016-08-08 VITALS — BP 139/86 | HR 79 | Ht 71.0 in | Wt 241.0 lb

## 2016-08-08 DIAGNOSIS — Z0000 Encounter for general adult medical examination without abnormal findings: Secondary | ICD-10-CM

## 2016-08-08 DIAGNOSIS — R7989 Other specified abnormal findings of blood chemistry: Secondary | ICD-10-CM

## 2016-08-08 NOTE — Progress Notes (Signed)
[employer] insurance premium discount evaluation: Labs Drawn. Replacements ROI form signed. Tobacco Free Attestation form signed.  Forms placed in paper chart.  Ok to route results to pcp per pt.

## 2016-08-09 LAB — CMP12+LP+TP+TSH+6AC+PSA+CBC…
ALK PHOS: 68 IU/L (ref 39–117)
ALT: 58 IU/L — AB (ref 0–44)
AST: 42 IU/L — AB (ref 0–40)
Albumin/Globulin Ratio: 2 (ref 1.2–2.2)
Albumin: 4.3 g/dL (ref 3.5–5.5)
BASOS ABS: 0 10*3/uL (ref 0.0–0.2)
BUN/Creatinine Ratio: 19 (ref 9–20)
BUN: 18 mg/dL (ref 6–24)
Basos: 1 %
Bilirubin Total: <0.2 mg/dL (ref 0.0–1.2)
CALCIUM: 9.3 mg/dL (ref 8.7–10.2)
CHLORIDE: 101 mmol/L (ref 96–106)
CHOL/HDL RATIO: 3.6 ratio (ref 0.0–5.0)
CREATININE: 0.96 mg/dL (ref 0.76–1.27)
Cholesterol, Total: 214 mg/dL — ABNORMAL HIGH (ref 100–199)
EOS (ABSOLUTE): 0.3 10*3/uL (ref 0.0–0.4)
Eos: 6 %
Estimated CHD Risk: 0.6 times avg. (ref 0.0–1.0)
Free Thyroxine Index: 1.1 — ABNORMAL LOW (ref 1.2–4.9)
GFR calc non Af Amer: 91 mL/min/{1.73_m2} (ref >59)
GFR, EST AFRICAN AMERICAN: 105 mL/min/{1.73_m2} (ref >59)
GGT: 45 IU/L (ref 0–65)
GLOBULIN, TOTAL: 2.2 g/dL (ref 1.5–4.5)
GLUCOSE: 92 mg/dL (ref 65–99)
HDL: 59 mg/dL (ref >39)
HEMATOCRIT: 41 % (ref 37.5–51.0)
HEMOGLOBIN: 13.6 g/dL (ref 13.0–17.7)
IMMATURE GRANS (ABS): 0 10*3/uL (ref 0.0–0.1)
Immature Granulocytes: 1 %
Iron: 151 ug/dL (ref 38–169)
LDH: 185 IU/L (ref 121–224)
LYMPHS: 37 %
Lymphocytes Absolute: 1.6 10*3/uL (ref 0.7–3.1)
MCH: 30.7 pg (ref 26.6–33.0)
MCHC: 33.2 g/dL (ref 31.5–35.7)
MCV: 93 fL (ref 79–97)
MONOCYTES: 10 %
Monocytes Absolute: 0.5 10*3/uL (ref 0.1–0.9)
Neutrophils Absolute: 2 10*3/uL (ref 1.4–7.0)
Neutrophils: 45 %
PHOSPHORUS: 3 mg/dL (ref 2.5–4.5)
PLATELETS: 308 10*3/uL (ref 150–379)
PROSTATE SPECIFIC AG, SERUM: 0.7 ng/mL (ref 0.0–4.0)
Potassium: 4.6 mmol/L (ref 3.5–5.2)
RBC: 4.43 x10E6/uL (ref 4.14–5.80)
RDW: 13.9 % (ref 12.3–15.4)
Sodium: 138 mmol/L (ref 134–144)
T3 Uptake Ratio: 23 % — ABNORMAL LOW (ref 24–39)
T4 TOTAL: 4.9 ug/dL (ref 4.5–12.0)
TRIGLYCERIDES: 426 mg/dL — AB (ref 0–149)
TSH: 2.51 u[IU]/mL (ref 0.450–4.500)
Total Protein: 6.5 g/dL (ref 6.0–8.5)
URIC ACID: 8.5 mg/dL (ref 3.7–8.6)
WBC: 4.4 10*3/uL (ref 3.4–10.8)

## 2016-08-09 LAB — VITAMIN D 25 HYDROXY (VIT D DEFICIENCY, FRACTURES): Vit D, 25-Hydroxy: 19.5 ng/mL — ABNORMAL LOW (ref 30.0–100.0)

## 2016-08-09 LAB — HGB A1C W/O EAG: HEMOGLOBIN A1C: 5.4 % (ref 4.8–5.6)

## 2016-08-14 NOTE — Progress Notes (Signed)
Results reviewed with pt. Agrees to recheck labs in 6 weeks. Appt made. Denies recent major diet, medication or lifestyle change that could contribute to LFTs and Lipid panel increases.Consider Vit D supplement as he already is outside in shorts and t-shirt most days. Diet and exercise recommendations discussed. Copy provided to pt. Results routed to PCP per pt request with advisement of plan to repeat labs.

## 2016-08-28 ENCOUNTER — Ambulatory Visit: Payer: Self-pay | Admitting: Registered Nurse

## 2016-08-28 VITALS — BP 124/83 | HR 82 | Temp 99.0°F

## 2016-08-28 DIAGNOSIS — J0101 Acute recurrent maxillary sinusitis: Secondary | ICD-10-CM

## 2016-08-28 DIAGNOSIS — H65195 Other acute nonsuppurative otitis media, recurrent, left ear: Secondary | ICD-10-CM

## 2016-08-28 MED ORDER — AMOXICILLIN-POT CLAVULANATE 875-125 MG PO TABS: 1.0000 | ORAL_TABLET | Freq: Two times a day (BID) | ORAL | 0 refills | Status: AC

## 2016-08-28 NOTE — Patient Instructions (Addendum)
Otitis Media, Adult Otitis media occurs when there is inflammation and fluid in the middle ear. Your middle ear is a part of the ear that contains bones for hearing as well as air that helps send sounds to your brain. What are the causes? This condition is caused by a blockage in the eustachian tube. This tube drains fluid from the ear to the back of the nose (nasopharynx). A blockage in this tube can be caused by an object or by swelling (edema) in the tube. Problems that can cause a blockage include:  A cold or other upper respiratory infection.  Allergies.  An irritant, such as tobacco smoke.  Enlarged adenoids. The adenoids are areas of soft tissue located high in the back of the throat, behind the nose and the roof of the mouth.  A mass in the nasopharynx.  Damage to the ear caused by pressure changes (barotrauma). What are the signs or symptoms? Symptoms of this condition include:  Ear pain.  A fever.  Decreased hearing.  A headache.  Tiredness (lethargy).  Fluid leaking from the ear.  Ringing in the ear. How is this diagnosed? This condition is diagnosed with a physical exam. During the exam your health care provider will use an instrument called an otoscope to look into your ear and check for redness, swelling, and fluid. He or she will also ask about your symptoms. Your health care provider may also order tests, such as:  A test to check the movement of the eardrum (pneumatic otoscopy). This test is done by squeezing a small amount of air into the ear.  A test that changes air pressure in the middle ear to check how well the eardrum moves and whether the eustachian tube is working (tympanogram). How is this treated? This condition usually goes away on its own within 3-5 days. But if the condition is caused by a bacteria infection and does not go away own its own, or keeps coming back, your health care provider may:  Prescribe antibiotic medicines to treat the  infection.  Prescribe or recommend medicines to control pain. Follow these instructions at home:  Take over-the-counter and prescription medicines only as told by your health care provider.  If you were prescribed an antibiotic medicine, take it as told by your health care provider. Do not stop taking the antibiotic even if you start to feel better.  Keep all follow-up visits as told by your health care provider. This is important. Contact a health care provider if:  You have bleeding from your nose.  There is a lump on your neck.  You are not getting better in 5 days.  You feel worse instead of better. Get help right away if:  You have severe pain that is not controlled with medicine.  You have swelling, redness, or pain around your ear.  You have stiffness in your neck.  A part of your face is paralyzed.  The bone behind your ear (mastoid) is tender when you touch it.  You develop a severe headache. Summary  Otitis media is redness, soreness, and swelling of the middle ear.  This condition usually goes away on its own within 3-5 days.  If the problem does not go away in 3-5 days, your health care provider may prescribe or recommend medicines to treat your symptoms.  If you were prescribed an antibiotic medicine, take it as told by your health care provider. This information is not intended to replace advice given to you by your   to you by your health care provider. Make sure you discuss any questions you have with your health care provider. Document Released: 10/05/2003 Document Revised: 12/21/2015 Document Reviewed: 12/21/2015 Elsevier Interactive Patient Education  2017 [employer] Sinusitis, Adult Sinusitis is soreness and inflammation of your sinuses. Sinuses are hollow spaces in the bones around your face. Your sinuses are located:  Around your eyes.  In the middle of your forehead.  Behind your nose.  In your cheekbones.  Your sinuses and nasal passages are lined  with a stringy fluid (mucus). Mucus normally drains out of your sinuses. When your nasal tissues become inflamed or swollen, the mucus can become trapped or blocked so air cannot flow through your sinuses. This allows bacteria, viruses, and funguses to grow, which leads to infection. Sinusitis can develop quickly and last for 7?10 days (acute) or for more than 12 weeks (chronic). Sinusitis often develops after a cold. What are the causes? This condition is caused by anything that creates swelling in the sinuses or stops mucus from draining, including:  Allergies.  Asthma.  Bacterial or viral infection.  Abnormally shaped bones between the nasal passages.  Nasal growths that contain mucus (nasal polyps).  Narrow sinus openings.  Pollutants, such as chemicals or irritants in the air.  A foreign object stuck in the nose.  A fungal infection. This is rare.  What increases the risk? The following factors may make you more likely to develop this condition:  Having allergies or asthma.  Having had a recent cold or respiratory tract infection.  Having structural deformities or blockages in your nose or sinuses.  Having a weak immune system.  Doing a lot of swimming or diving.  Overusing nasal sprays.  Smoking.  What are the signs or symptoms? The main symptoms of this condition are pain and a feeling of pressure around the affected sinuses. Other symptoms include:  Upper toothache.  Earache.  Headache.  Bad breath.  Decreased sense of smell and taste.  A cough that may get worse at night.  Fatigue.  Fever.  Thick drainage from your nose. The drainage is often green and it may contain pus (purulent).  Stuffy nose or congestion.  Postnasal drip. This is when extra mucus collects in the throat or back of the nose.  Swelling and warmth over the affected sinuses.  Sore throat.  Sensitivity to light.  How is this diagnosed? This condition is diagnosed based  on symptoms, a medical history, and a physical exam. To find out if your condition is acute or chronic, your health care provider may:  Look in your nose for signs of nasal polyps.  Tap over the affected sinus to check for signs of infection.  View the inside of your sinuses using an imaging device that has a light attached (endoscope).  If your health care provider suspects that you have chronic sinusitis, you may also:  Be tested for allergies.  Have a sample of mucus taken from your nose (nasal culture) and checked for bacteria.  Have a mucus sample examined to see if your sinusitis is related to an allergy.  If your sinusitis does not respond to treatment and it lasts longer than 8 weeks, you may have an MRI or CT scan to check your sinuses. These scans also help to determine how severe your infection is. In rare cases, a bone biopsy may be done to rule out more serious types of fungal sinus disease. How is this treated? Treatment for sinusitis depends   on the cause and whether your condition is chronic or acute. If a virus is causing your sinusitis, your symptoms will go away on their own within 10 days. You may be given medicines to relieve your symptoms, including:  Topical nasal decongestants. They shrink swollen nasal passages and let mucus drain from your sinuses.  Antihistamines. These drugs block inflammation that is triggered by allergies. This can help to ease swelling in your nose and sinuses.  Topical nasal corticosteroids. These are nasal sprays that ease inflammation and swelling in your nose and sinuses.  Nasal saline washes. These rinses can help to get rid of thick mucus in your nose.  If your condition is caused by bacteria, you will be given an antibiotic medicine. If your condition is caused by a fungus, you will be given an antifungal medicine. Surgery may be needed to correct underlying conditions, such as narrow nasal passages. Surgery may also be needed to  remove polyps. Follow these instructions at home: Medicines  Take, use, or apply over-the-counter and prescription medicines only as told by your health care provider. These may include nasal sprays.  If you were prescribed an antibiotic medicine, take it as told by your health care provider. Do not stop taking the antibiotic even if you start to feel better. Hydrate and Humidify  Drink enough water to keep your urine clear or pale yellow. Staying hydrated will help to thin your mucus.  Use a cool mist humidifier to keep the humidity level in your home above 50%.  Inhale steam for 10-15 minutes, 3-4 times a day or as told by your health care provider. You can do this in the bathroom while a hot shower is running.  Limit your exposure to cool or dry air. Rest  Rest as much as possible.  Sleep with your head raised (elevated).  Make sure to get enough sleep each night. General instructions  Apply a warm, moist washcloth to your face 3-4 times a day or as told by your health care provider. This will help with discomfort.  Wash your hands often with soap and water to reduce your exposure to viruses and other germs. If soap and water are not available, use hand sanitizer.  Do not smoke. Avoid being around people who are smoking (secondhand smoke).  Keep all follow-up visits as told by your health care provider. This is important. Contact a health care provider if:  You have a fever.  Your symptoms get worse.  Your symptoms do not improve within 10 days. Get help right away if:  You have a severe headache.  You have persistent vomiting.  You have pain or swelling around your face or eyes.  You have vision problems.  You develop confusion.  Your neck is stiff.  You have trouble breathing. This information is not intended to replace advice given to you by your health care provider. Make sure you discuss any questions you have with your health care provider. Document  Released: 12/30/2004 Document Revised: 08/26/2015 Document Reviewed: 10/25/2014 Elsevier Interactive Patient Education  2017 [employer] Sinus Rinse What is a sinus rinse? A sinus rinse is a simple home treatment that is used to rinse your sinuses with a sterile mixture of salt and water (saline solution). Sinuses are air-filled spaces in your skull behind the bones of your face and forehead that open into your nasal cavity. You will use the following:  Saline solution.  Neti pot or spray bottle. This releases the saline solution into your  nose and through your sinuses. Neti pots and spray bottles can be purchased at [employer], a health food store, or online.  When would I do a sinus rinse? A sinus rinse can help to clear mucus, dirt, dust, or pollen from the nasal cavity. You may do a sinus rinse when you have a cold, a virus, nasal allergy symptoms, a sinus infection, or stuffiness in the nose or sinuses. If you are considering a sinus rinse:  Ask your child's health care provider before performing a sinus rinse on your child.  Do not do a sinus rinse if you have had ear or nasal surgery, ear infection, or blocked ears.  How do I do a sinus rinse?  Wash your hands.  Disinfect your device according to the directions provided and then dry it.  Use the solution that comes with your device or one that is sold separately in stores. Follow the mixing directions on the package.  Fill your device with the amount of saline solution as directed by the device instructions.  Stand over a sink and tilt your head sideways over the sink.  Place the spout of the device in your upper nostril (the one closer to the ceiling).  Gently pour or squeeze the saline solution into the nasal cavity. The liquid should drain to the lower nostril if you are not overly congested.  Gently blow your nose. Blowing too hard may cause ear pain.  Repeat in the other nostril.  Clean and rinse your  device with clean water and then air-dry it. Are there risks of a sinus rinse? Sinus rinse is generally very safe and effective. However, there are a few risks, which include:  A burning sensation in the sinuses. This may happen if you do not make the saline solution as directed. Make sure to follow all directions when making the saline solution.  Infection from contaminated water. This is rare, but possible.  Nasal irritation.  This information is not intended to replace advice given to you by your health care provider. Make sure you discuss any questions you have with your health care provider. Document Released: 07/27/2013 Document Revised: 11/27/2015 Document Reviewed: 05/17/2013 Elsevier Interactive Patient Education  2017 [employer].  Otitis Media, Adult Otitis media occurs when there is inflammation and fluid in the middle ear. Your middle ear is a part of the ear that contains bones for hearing as well as air that helps send sounds to your brain. What are the causes? This condition is caused by a blockage in the eustachian tube. This tube drains fluid from the ear to the back of the nose (nasopharynx). A blockage in this tube can be caused by an object or by swelling (edema) in the tube. Problems that can cause a blockage include:  A cold or other upper respiratory infection.  Allergies.  An irritant, such as tobacco smoke.  Enlarged adenoids. The adenoids are areas of soft tissue located high in the back of the throat, behind the nose and the roof of the mouth.  A mass in the nasopharynx.  Damage to the ear caused by pressure changes (barotrauma).  What are the signs or symptoms? Symptoms of this condition include:  Ear pain.  A fever.  Decreased hearing.  A headache.  Tiredness (lethargy).  Fluid leaking from the ear.  Ringing in the ear.  How is this diagnosed? This condition is diagnosed with a physical exam. During the exam your health care provider  will use an  instrument called an otoscope to look into your ear and check for redness, swelling, and fluid. He or she will also ask about your symptoms. Your health care provider may also order tests, such as:  A test to check the movement of the eardrum (pneumatic otoscopy). This test is done by squeezing a small amount of air into the ear.  A test that changes air pressure in the middle ear to check how well the eardrum moves and whether the eustachian tube is working (tympanogram).  How is this treated? This condition usually goes away on its own within 3-5 days. But if the condition is caused by a bacteria infection and does not go away own its own, or keeps coming back, your health care provider may:  Prescribe antibiotic medicines to treat the infection.  Prescribe or recommend medicines to control pain.  Follow these instructions at home:  Take over-the-counter and prescription medicines only as told by your health care provider.  If you were prescribed an antibiotic medicine, take it as told by your health care provider. Do not stop taking the antibiotic even if you start to feel better.  Keep all follow-up visits as told by your health care provider. This is important. Contact a health care provider if:  You have bleeding from your nose.  There is a lump on your neck.  You are not getting better in 5 days.  You feel worse instead of better. Get help right away if:  You have severe pain that is not controlled with medicine.  You have swelling, redness, or pain around your ear.  You have stiffness in your neck.  A part of your face is paralyzed.  The bone behind your ear (mastoid) is tender when you touch it.  You develop a severe headache. Summary  Otitis media is redness, soreness, and swelling of the middle ear.  This condition usually goes away on its own within 3-5 days.  If the problem does not go away in 3-5 days, your health care provider may prescribe  or recommend medicines to treat your symptoms.  If you were prescribed an antibiotic medicine, take it as told by your health care provider. This information is not intended to replace advice given to you by your health care provider. Make sure you discuss any questions you have with your health care provider. Document Released: 10/05/2003 Document Revised: 12/21/2015 Document Reviewed: 12/21/2015 Elsevier Interactive Patient Education  2017 [employer] Sinus Rinse What is a sinus rinse? A sinus rinse is a simple home treatment that is used to rinse your sinuses with a sterile mixture of salt and water (saline solution). Sinuses are air-filled spaces in your skull behind the bones of your face and forehead that open into your nasal cavity. You will use the following:  Saline solution.  Neti pot or spray bottle. This releases the saline solution into your nose and through your sinuses. Neti pots and spray bottles can be purchased at [employer], a health food store, or online.  When would I do a sinus rinse? A sinus rinse can help to clear mucus, dirt, dust, or pollen from the nasal cavity. You may do a sinus rinse when you have a cold, a virus, nasal allergy symptoms, a sinus infection, or stuffiness in the nose or sinuses. If you are considering a sinus rinse:  Ask your child's health care provider before performing a sinus rinse on your child.  Do not do a sinus rinse if you have  had ear or nasal surgery, ear infection, or blocked ears.  How do I do a sinus rinse?  Wash your hands.  Disinfect your device according to the directions provided and then dry it.  Use the solution that comes with your device or one that is sold separately in stores. Follow the mixing directions on the package.  Fill your device with the amount of saline solution as directed by the device instructions.  Stand over a sink and tilt your head sideways over the sink.  Place the spout of the  device in your upper nostril (the one closer to the ceiling).  Gently pour or squeeze the saline solution into the nasal cavity. The liquid should drain to the lower nostril if you are not overly congested.  Gently blow your nose. Blowing too hard may cause ear pain.  Repeat in the other nostril.  Clean and rinse your device with clean water and then air-dry it. Are there risks of a sinus rinse? Sinus rinse is generally very safe and effective. However, there are a few risks, which include:  A burning sensation in the sinuses. This may happen if you do not make the saline solution as directed. Make sure to follow all directions when making the saline solution.  Infection from contaminated water. This is rare, but possible.  Nasal irritation.  This information is not intended to replace advice given to you by your health care provider. Make sure you discuss any questions you have with your health care provider. Document Released: 07/27/2013 Document Revised: 11/27/2015 Document Reviewed: 05/17/2013 Elsevier Interactive Patient Education  2017 [employer]  Sinusitis, Adult Sinusitis is soreness and inflammation of your sinuses. Sinuses are hollow spaces in the bones around your face. Your sinuses are located:  Around your eyes.  In the middle of your forehead.  Behind your nose.  In your cheekbones.  Your sinuses and nasal passages are lined with a stringy fluid (mucus). Mucus normally drains out of your sinuses. When your nasal tissues become inflamed or swollen, the mucus can become trapped or blocked so air cannot flow through your sinuses. This allows bacteria, viruses, and funguses to grow, which leads to infection. Sinusitis can develop quickly and last for 7?10 days (acute) or for more than 12 weeks (chronic). Sinusitis often develops after a cold. What are the causes? This condition is caused by anything that creates swelling in the sinuses or stops mucus from draining,  including:  Allergies.  Asthma.  Bacterial or viral infection.  Abnormally shaped bones between the nasal passages.  Nasal growths that contain mucus (nasal polyps).  Narrow sinus openings.  Pollutants, such as chemicals or irritants in the air.  A foreign object stuck in the nose.  A fungal infection. This is rare.  What increases the risk? The following factors may make you more likely to develop this condition:  Having allergies or asthma.  Having had a recent cold or respiratory tract infection.  Having structural deformities or blockages in your nose or sinuses.  Having a weak immune system.  Doing a lot of swimming or diving.  Overusing nasal sprays.  Smoking.  What are the signs or symptoms? The main symptoms of this condition are pain and a feeling of pressure around the affected sinuses. Other symptoms include:  Upper toothache.  Earache.  Headache.  Bad breath.  Decreased sense of smell and taste.  A cough that may get worse at night.  Fatigue.  Fever.  Thick drainage from your  nose. The drainage is often green and it may contain pus (purulent).  Stuffy nose or congestion.  Postnasal drip. This is when extra mucus collects in the throat or back of the nose.  Swelling and warmth over the affected sinuses.  Sore throat.  Sensitivity to light.  How is this diagnosed? This condition is diagnosed based on symptoms, a medical history, and a physical exam. To find out if your condition is acute or chronic, your health care provider may:  Look in your nose for signs of nasal polyps.  Tap over the affected sinus to check for signs of infection.  View the inside of your sinuses using an imaging device that has a light attached (endoscope).  If your health care provider suspects that you have chronic sinusitis, you may also:  Be tested for allergies.  Have a sample of mucus taken from your nose (nasal culture) and checked for  bacteria.  Have a mucus sample examined to see if your sinusitis is related to an allergy.  If your sinusitis does not respond to treatment and it lasts longer than 8 weeks, you may have an MRI or CT scan to check your sinuses. These scans also help to determine how severe your infection is. In rare cases, a bone biopsy may be done to rule out more serious types of fungal sinus disease. How is this treated? Treatment for sinusitis depends on the cause and whether your condition is chronic or acute. If a virus is causing your sinusitis, your symptoms will go away on their own within 10 days. You may be given medicines to relieve your symptoms, including:  Topical nasal decongestants. They shrink swollen nasal passages and let mucus drain from your sinuses.  Antihistamines. These drugs block inflammation that is triggered by allergies. This can help to ease swelling in your nose and sinuses.  Topical nasal corticosteroids. These are nasal sprays that ease inflammation and swelling in your nose and sinuses.  Nasal saline washes. These rinses can help to get rid of thick mucus in your nose.  If your condition is caused by bacteria, you will be given an antibiotic medicine. If your condition is caused by a fungus, you will be given an antifungal medicine. Surgery may be needed to correct underlying conditions, such as narrow nasal passages. Surgery may also be needed to remove polyps. Follow these instructions at home: Medicines  Take, use, or apply over-the-counter and prescription medicines only as told by your health care provider. These may include nasal sprays.  If you were prescribed an antibiotic medicine, take it as told by your health care provider. Do not stop taking the antibiotic even if you start to feel better. Hydrate and Humidify  Drink enough water to keep your urine clear or pale yellow. Staying hydrated will help to thin your mucus.  Use a cool mist humidifier to keep the  humidity level in your home above 50%.  Inhale steam for 10-15 minutes, 3-4 times a day or as told by your health care provider. You can do this in the bathroom while a hot shower is running.  Limit your exposure to cool or dry air. Rest  Rest as much as possible.  Sleep with your head raised (elevated).  Make sure to get enough sleep each night. General instructions  Apply a warm, moist washcloth to your face 3-4 times a day or as told by your health care provider. This will help with discomfort.  Wash your hands often with soap  and water to reduce your exposure to viruses and other germs. If soap and water are not available, use hand sanitizer.  Do not smoke. Avoid being around people who are smoking (secondhand smoke).  Keep all follow-up visits as told by your health care provider. This is important. Contact a health care provider if:  You have a fever.  Your symptoms get worse.  Your symptoms do not improve within 10 days. Get help right away if:  You have a severe headache.  You have persistent vomiting.  You have pain or swelling around your face or eyes.  You have vision problems.  You develop confusion.  Your neck is stiff.  You have trouble breathing. This information is not intended to replace advice given to you by your health care provider. Make sure you discuss any questions you have with your health care provider. Document Released: 12/30/2004 Document Revised: 08/26/2015 Document Reviewed: 10/25/2014 Elsevier Interactive Patient Education  2017 [employer].

## 2016-08-28 NOTE — Progress Notes (Signed)
Subjective:    Patient ID: Rodney Cisneros, male    DOB: [birth year], 51 y.o.   MRN: [id number]  51y/o married caucasian male established patient reports L otalgia x4-5 days. Pain extending down jaw to teeth. +drainage from left ear, sticky, yellow drainage. Coworkers with similar symptoms.  Using his flonase, saline, claritin and singulair and not helping.  Doesn't feel as bad as a couple months ago; cough dry denied wheezing      Review of Systems  Constitutional: Negative for activity change, appetite change, chills, diaphoresis, fatigue, fever and unexpected weight change.  HENT: Positive for congestion, ear discharge, ear pain, postnasal drip, rhinorrhea, sinus pain, sinus pressure and sore throat. Negative for dental problem, drooling, facial swelling, hearing loss, mouth sores, nosebleeds, sneezing, tinnitus, trouble swallowing and voice change.   Eyes: Negative for photophobia, pain, discharge, redness, itching and visual disturbance.  Respiratory: Positive for cough. Negative for choking, chest tightness, shortness of breath, wheezing and stridor.   Cardiovascular: Negative for chest pain, palpitations and leg swelling.  Gastrointestinal: Negative for abdominal distention, abdominal pain, blood in stool, constipation, diarrhea, nausea and vomiting.  Endocrine: Negative for cold intolerance and heat intolerance.  Genitourinary: Negative for dysuria.  Musculoskeletal: Negative for arthralgias, back pain, gait problem, joint swelling, myalgias, neck pain and neck stiffness.  Skin: Negative for color change, pallor, rash and wound.  Allergic/Immunologic: Positive for environmental allergies. Negative for food allergies and immunocompromised state.  Neurological: Negative for dizziness, tremors, seizures, syncope, facial asymmetry, speech difficulty, weakness, light-headedness, numbness and headaches.  Hematological: Negative for adenopathy. Does not bruise/bleed easily.   Psychiatric/Behavioral: Negative for agitation, behavioral problems, confusion and sleep disturbance.       Objective:   Physical Exam  Constitutional: He is oriented to person, place, and time. Vital signs are normal. He appears well-developed and well-nourished. He is active and cooperative.  Non-toxic appearance. He does not have a sickly appearance. He appears ill. No distress.  HENT:  Head: Normocephalic and atraumatic.  Right Ear: Hearing, external ear and ear canal normal. A middle ear effusion is present.  Left Ear: Hearing, external ear and ear canal normal. Tympanic membrane is erythematous and bulging. A middle ear effusion is present.  Nose: Mucosal edema and rhinorrhea present. No nose lacerations, sinus tenderness, nasal deformity, septal deviation or nasal septal hematoma. No epistaxis.  No foreign bodies. Right sinus exhibits maxillary sinus tenderness. Right sinus exhibits no frontal sinus tenderness. Left sinus exhibits maxillary sinus tenderness. Left sinus exhibits no frontal sinus tenderness.  Mouth/Throat: Uvula is midline and mucous membranes are normal. Mucous membranes are not pale, not dry and not cyanotic. He does not have dentures. No oral lesions. No trismus in the jaw. Normal dentition. No dental abscesses, uvula swelling, lacerations or dental caries. Posterior oropharyngeal edema and posterior oropharyngeal erythema present. No oropharyngeal exudate or tonsillar abscesses.  Bilateral allergic shiners; cobblestoning posterior pharynx; left TM erythema 12-4pm greatest and adjacent auditory canal 6 oclock; air fluid level clear; right TM air fluid level clear; bilateral nasal turbinates edema/erythema yellow/clear discharge  Eyes: Pupils are equal, round, and reactive to light. Conjunctivae, EOM and lids are normal. Right eye exhibits no chemosis, no discharge, no exudate and no hordeolum. No foreign body present in the right eye. Left eye exhibits no chemosis, no  discharge, no exudate and no hordeolum. No foreign body present in the left eye. Right conjunctiva is not injected. Right conjunctiva has no hemorrhage. Left conjunctiva is not injected. Left conjunctiva has  no hemorrhage. No scleral icterus. Right eye exhibits normal extraocular motion and no nystagmus. Left eye exhibits normal extraocular motion and no nystagmus. Right pupil is round and reactive. Left pupil is round and reactive. Pupils are equal.  Neck: Trachea normal, normal range of motion and phonation normal. Neck supple. No tracheal tenderness, no spinous process tenderness and no muscular tenderness present. No neck rigidity. No tracheal deviation, no edema, no erythema and normal range of motion present. No thyroid mass and no thyromegaly present.  Spoke full sentences without difficulty  Cardiovascular: Normal rate, regular rhythm, S1 normal, S2 normal, normal heart sounds and intact distal pulses.  PMI is not displaced.  Exam reveals no gallop and no friction rub.   No murmur heard. Pulmonary/Chest: Effort normal and breath sounds normal. No stridor. No respiratory distress. He has no decreased breath sounds. He has no wheezes. He has no rhonchi. He has no rales.  Cough not observed in exam room  Abdominal: Normal appearance. He exhibits no distension, no fluid wave and no ascites. There is no tenderness. There is no rigidity and no guarding.  Musculoskeletal: Normal range of motion. He exhibits no edema or tenderness.       Right shoulder: Normal.       Left shoulder: Normal.       Right elbow: Normal.      Left elbow: Normal.       Right hip: Normal.       Left hip: Normal.       Right knee: Normal.       Left knee: Normal.       Cervical back: Normal.       Right hand: Normal.       Left hand: Normal.  Lymphadenopathy:       Head (right side): No submental, no submandibular, no tonsillar, no preauricular, no posterior auricular and no occipital adenopathy present.       Head  (left side): No submental, no submandibular, no tonsillar, no preauricular, no posterior auricular and no occipital adenopathy present.    He has no cervical adenopathy.       Right cervical: No superficial cervical, no deep cervical and no posterior cervical adenopathy present.      Left cervical: No superficial cervical, no deep cervical and no posterior cervical adenopathy present.  Neurological: He is alert and oriented to person, place, and time. He has normal strength. He displays no atrophy and no tremor. No cranial nerve deficit or sensory deficit. He exhibits normal muscle tone. He displays no seizure activity. Coordination and gait normal. GCS eye subscore is 4. GCS verbal subscore is 5. GCS motor subscore is 6.  On/off exam table; in/out of chair without difficulty; gait sure and steady in hall  Skin: Skin is warm, dry and intact. No abrasion, no bruising, no burn, no ecchymosis, no laceration, no lesion, no petechiae and no rash noted. He is not diaphoretic. No cyanosis or erythema. No pallor. Nails show no clubbing.  Psychiatric: He has a normal mood and affect. His speech is normal and behavior is normal. Judgment and thought content normal. Cognition and memory are normal.  Nursing note and vitals reviewed.         Assessment & Plan:  A-left otitis media acute; acute recurrent maxillary sinusitis  P-augmentin 875mg  po BID x 10 days #20 RF0; continue claritin, singulair, flonase, nasal saline.  Tylenol 1000mg  po QID prn pain.  DIscussed if can decrease prilosec dose for duration of  antibiotic will increase antibiotic absorption/efficicacy.  Patient may use normal saline nasal spray as needed.  Continue antihistamine and nasal steroid use.  Avoid triggers if possible.  Shower prior to bedtime if exposed to triggers.  If allergic dust/dust mites recommend mattress/pillow covers/encasements; washing linens, vacuuming, sweeping, dusting weekly.  Call or return to clinic as needed if these  symptoms worsen or fail to improve as anticipated.   Exitcare handout on allergic rhinitis, sinus rinse given to patient.  Patient verbalized understanding of instructions, agreed with plan of care and had no further questions at this time.  P2:  Avoidance and hand washing.   Supportive treatment.   No evidence of invasive bacterial infection, non toxic and well hydrated.  This is most likely self limiting viral infection.  I do not see where any further testing or imaging is necessary at this time.   I will suggest supportive care, rest, good hygiene and encourage the patient to take adequate fluids.  The patient is to return to clinic or EMERGENCY ROOM if symptoms worsen or change significantly e.g. ear pain, fever, purulent discharge from ears or bleeding.  Exitcare handout on otitis media given to patient.  Patient verbalized agreement and understanding of treatment plan.      No evidence of systemic bacterial infection, non toxic and well hydrated.  I do not see where any further testing or imaging is necessary at this time.   I will suggest supportive care, rest, good hygiene and encourage the patient to take adequate fluids.  The patient is to return to clinic or EMERGENCY ROOM if symptoms worsen or change significantly.  Exitcare handout on sinusitis given to patient.  Patient verbalized agreement and understanding of treatment plan and had no further questions at this time.   P2:  Hand washing and cover cough

## 2016-10-02 ENCOUNTER — Other Ambulatory Visit: Payer: Self-pay | Admitting: *Deleted

## 2016-10-02 DIAGNOSIS — E7889 Other lipoprotein metabolism disorders: Secondary | ICD-10-CM

## 2016-10-02 DIAGNOSIS — R945 Abnormal results of liver function studies: Principal | ICD-10-CM

## 2016-10-02 DIAGNOSIS — R7989 Other specified abnormal findings of blood chemistry: Secondary | ICD-10-CM

## 2016-10-02 NOTE — Progress Notes (Signed)
Recheck of lipid panel and LFTs per order 6 weeks ago.

## 2016-10-03 LAB — SPECIMEN STATUS

## 2016-10-03 LAB — LIPID PANEL
CHOL/HDL RATIO: 2.8 ratio (ref 0.0–5.0)
CHOLESTEROL TOTAL: 200 mg/dL — AB (ref 100–199)
HDL: 71 mg/dL (ref >39)
LDL Calculated: 63 mg/dL (ref 0–99)
TRIGLYCERIDES: 328 mg/dL — AB (ref 0–149)
VLDL Cholesterol Cal: 66 mg/dL — ABNORMAL HIGH (ref 5–40)

## 2016-10-03 LAB — HEPATIC FUNCTION PANEL
ALT: 50 IU/L — AB (ref 0–44)
AST: 44 IU/L — AB (ref 0–40)
Albumin: 4.4 g/dL (ref 3.5–5.5)
Alkaline Phosphatase: 73 IU/L (ref 39–117)
BILIRUBIN, DIRECT: 0.07 mg/dL (ref 0.00–0.40)
Bilirubin Total: <0.2 mg/dL (ref 0.0–1.2)
Total Protein: 6.6 g/dL (ref 6.0–8.5)

## 2016-10-03 LAB — SPECIMEN STATUS REPORT

## 2016-10-07 NOTE — Progress Notes (Signed)
Results reviewed with pt. Advised to make f/u appt with pcp. Results routed to pcp with same recommendation. Pt still denies any known diet and lifestyle change that could be a culprit for these abnormalities.

## 2016-10-21 ENCOUNTER — Ambulatory Visit: Payer: Self-pay | Admitting: Registered Nurse

## 2016-10-21 VITALS — BP 142/90 | HR 70 | Temp 98.6°F

## 2016-10-21 DIAGNOSIS — M5431 Sciatica, right side: Secondary | ICD-10-CM

## 2016-10-21 MED ORDER — CYCLOBENZAPRINE HCL 10 MG PO TABS: 10.0000 mg | ORAL_TABLET | Freq: Three times a day (TID) | ORAL | 0 refills | Status: DC | PRN

## 2016-10-21 MED ORDER — DICLOFENAC SODIUM 75 MG PO TBEC: 75.0000 mg | DELAYED_RELEASE_TABLET | Freq: Two times a day (BID) | ORAL | Status: DC | PRN

## 2016-10-21 NOTE — Patient Instructions (Signed)
Sciatica  Radicular Pain Radicular pain is a type of pain that spreads from your back or neck along a spinal nerve. Spinal nerves are nerves that leave the spinal cord and go to the muscles. Radicular pain occurs when one of these nerves becomes irritated or squeezed (compressed). Radicular pain is sometimes called radiculopathy, radiculitis, or a pinched nerve. When you have this type of pain, you may also have weakness, numbness, or tingling in the area of your body that is supplied by the nerve. The pain may feel sharp and burning. Spinal nerves leave the spinal cord through openings between the 24 bones (vertebrae) that make up the spine. Radicular pain is often caused by something pushing on a spinal nerve. This pushing may be done by a vertebra or by one of the round cushions between vertebrae (intervertebral disks). This can result from an injury, from wear and tear or aging of a disk, or from the growth of a bone spur that pushes on the nerve. Radicular pain can occur in various areas depending on which spinal nerve is affected:  Cervical radicular pain occurs in the neck. You may also feel pain, numbness, weakness, or tingling in the arms.  Thoracic radicular pain occurs in the mid-spine area. You would feel this pain in the back and chest. This type is rare.  Lumbar radicular pain occurs in the lower back area. You would feel this pain as low back pain. You may feel pain, numbness, weakness, or tingling in the buttocks or legs. Sciatica is a type of lumbar radicular pain that shoots down the back of the leg.  Radicular pain often goes away when you follow instructions from your health care provider for relieving pain at home. Follow these instructions at home: Managing pain  If directed, apply ice to the affected area: ? Put ice in a plastic bag. ? Place a towel between your skin and the bag. ? Leave the ice on for 20 minutes, 2-3 times a day.  If directed, apply heat to the affected  area as often as told by your health care provider. Use the heat source that your health care provider recommends, such as a moist heat pack or a heating pad. ? Place a towel between your skin and the heat source. ? Leave the heat on for 20-30 minutes. ? Remove the heat if your skin turns bright red. This is especially important if you are unable to feel pain, heat, or cold. You may have a greater risk of getting burned. Activity   Do not sit or rest in bed for long periods of time.  Try to stay as active as possible. Ask your health care provider what type of exercise or activity is best for you.  Avoid activities that make your pain worse, such as bending and lifting.  Do not lift anything that is heavier than 10 lb (4.5 kg). Practice using proper technique when lifting items. Proper lifting technique involves bending your knees and rising up.  Do strength and range-of-motion exercises only as told by your health care provider. General instructions  Take over-the-counter and prescription medicines only as told by your health care provider.  Pay attention to any changes in your symptoms.  Keep all follow-up visits as told by your health care provider. This is important. Contact a health care provider if:  Your pain and other symptoms get worse.  Your pain medicine is not helping.  Your pain has not improved after a few weeks of home  care.  You have a fever. Get help right away if:  You have severe pain, weakness, or numbness.  You have difficulty with bladder or bowel control. This information is not intended to replace advice given to you by your health care provider. Make sure you discuss any questions you have with your health care provider. Document Released: 02/07/2004 Document Revised: 06/07/2015 Document Reviewed: 07/26/2014 Elsevier Interactive Patient Education  2018 [employer].  Sciatica Rehab Ask your health care provider which exercises are safe for you. Do  exercises exactly as told by your health care provider and adjust them as directed. It is normal to feel mild stretching, pulling, tightness, or discomfort as you do these exercises, but you should stop right away if you feel sudden pain or your pain gets worse.Do not begin these exercises until told by your health care provider. Stretching and range of motion exercises These exercises warm up your muscles and joints and improve the movement and flexibility of your hips and your back. These exercises also help to relieve pain, numbness, and tingling. Exercise A: Sciatic nerve glide 1. Sit in a chair with your head facing down toward your chest. Place your hands behind your back. Let your shoulders slump forward. 2. Slowly straighten one of your knees while you tilt your head back as if you are looking toward the ceiling. Only straighten your leg as far as you can without making your symptoms worse. 3. Hold for __________ seconds. 4. Slowly return to the starting position. 5. Repeat with your other leg. Repeat __________ times. Complete this exercise __________ times a day. Exercise B: Knee to chest with hip adduction and internal rotation  1. Lie on your back on a firm surface with both legs straight. 2. Bend one of your knees and move it up toward your chest until you feel a gentle stretch in your lower back and buttock. Then, move your knee toward the shoulder that is on the opposite side from your leg. ? Hold your leg in this position by holding onto the front of your knee. 3. Hold for __________ seconds. 4. Slowly return to the starting position. 5. Repeat with your other leg. Repeat __________ times. Complete this exercise __________ times a day. Exercise C: Prone extension on elbows  1. Lie on your abdomen on a firm surface. A bed may be too soft for this exercise. 2. Prop yourself up on your elbows. 3. Use your arms to help lift your chest up until you feel a gentle stretch in your  abdomen and your lower back. ? This will place some of your body weight on your elbows. If this is uncomfortable, try stacking pillows under your chest. ? Your hips should stay down, against the surface that you are lying on. Keep your hip and back muscles relaxed. 4. Hold for __________ seconds. 5. Slowly relax your upper body and return to the starting position. Repeat __________ times. Complete this exercise __________ times a day. Strengthening exercises These exercises build strength and endurance in your back. Endurance is the ability to use your muscles for a long time, even after they get tired. Exercise D: Pelvic tilt 1. Lie on your back on a firm surface. Bend your knees and keep your feet flat. 2. Tense your abdominal muscles. Tip your pelvis up toward the ceiling and flatten your lower back into the floor. ? To help with this exercise, you may place a small towel under your lower back and try to push your  back into the towel. 3. Hold for __________ seconds. 4. Let your muscles relax completely before you repeat this exercise. Repeat __________ times. Complete this exercise __________ times a day. Exercise E: Alternating arm and leg raises  1. Get on your hands and knees on a firm surface. If you are on a hard floor, you may want to use padding to cushion your knees, such as an exercise mat. 2. Line up your arms and legs. Your hands should be below your shoulders, and your knees should be below your hips. 3. Lift your left leg behind you. At the same time, raise your right arm and straighten it in front of you. ? Do not lift your leg higher than your hip. ? Do not lift your arm higher than your shoulder. ? Keep your abdominal and back muscles tight. ? Keep your hips facing the ground. ? Do not arch your back. ? Keep your balance carefully, and do not hold your breath. 4. Hold for __________ seconds. 5. Slowly return to the starting position and repeat with your right leg and  your left arm. Repeat __________ times. Complete this exercise __________ times a day. Posture and body mechanics  Body mechanics refers to the movements and positions of your body while you do your daily activities. Posture is part of body mechanics. Good posture and healthy body mechanics can help to relieve stress in your body's tissues and joints. Good posture means that your spine is in its natural S-curve position (your spine is neutral), your shoulders are pulled back slightly, and your head is not tipped forward. The following are general guidelines for applying improved posture and body mechanics to your everyday activities. Standing   When standing, keep your spine neutral and your feet about hip-width apart. Keep a slight bend in your knees. Your ears, shoulders, and hips should line up.  When you do a task in which you stand in one place for a long time, place one foot up on a stable object that is 2-4 inches (5-10 cm) high, such as a footstool. This helps keep your spine neutral. Sitting   When sitting, keep your spine neutral and keep your feet flat on the floor. Use a footrest, if necessary, and keep your thighs parallel to the floor. Avoid rounding your shoulders, and avoid tilting your head forward.  When working at a desk or a computer, keep your desk at a height where your hands are slightly lower than your elbows. Slide your chair under your desk so you are close enough to maintain good posture.  When working at a computer, place your monitor at a height where you are looking straight ahead and you do not have to tilt your head forward or downward to look at the screen. Resting   When lying down and resting, avoid positions that are most painful for you.  If you have pain with activities such as sitting, bending, stooping, or squatting (flexion-based activities), lie in a position in which your body does not bend very much. For example, avoid curling up on your side with  your arms and knees near your chest (fetal position).  If you have pain with activities such as standing for a long time or reaching with your arms (extension-based activities), lie with your spine in a neutral position and bend your knees slightly. Try the following positions: ? Lying on your side with a pillow between your knees. ? Lying on your back with a pillow under your knees.  Lifting   When lifting objects, keep your feet at least shoulder-width apart and tighten your abdominal muscles.  Bend your knees and hips and keep your spine neutral. It is important to lift using the strength of your legs, not your back. Do not lock your knees straight out.  Always ask for help to lift heavy or awkward objects. This information is not intended to replace advice given to you by your health care provider. Make sure you discuss any questions you have with your health care provider. Document Released: 12/30/2004 Document Revised: 09/06/2015 Document Reviewed: 09/15/2014 Elsevier Interactive Patient Education  [employer].

## 2016-10-21 NOTE — Progress Notes (Signed)
Subjective:    Patient ID: Rodney Cisneros, male    DOB: Jul 21, 1965, 51 y.o.   MRN: [id number]  51y/o caucasian married male established pt reports L lower back pain radiating down in to L buttock and leg x3 days. Hx of L5 back surgery 20+ years ago having bone fragments removed from L5 area and sciatic nerve. Hx of sciatica as well. States this feels more like a surgery pain flare up than sciatica to him. Using Aleve and heat at home.  Headed to beach next week; usually naproxen for 2-3 days will work tried 2 OTC tabs BID and no relief; has been playing intramural volleyball probable reason for flare  Denied red flags e.g. Incontinence bowel/bladder, saddle paresthesias, arm/leg weakness.  Sits at work and that is worsening pain, stretching prn, tried heat helps a little.      Review of Systems  Constitutional: Negative for activity change, appetite change, chills, diaphoresis, fatigue and fever.  HENT: Negative for trouble swallowing and voice change.   Eyes: Negative for photophobia and visual disturbance.  Respiratory: Negative for shortness of breath, wheezing and stridor.   Cardiovascular: Negative for chest pain, palpitations and leg swelling.  Gastrointestinal: Negative for blood in stool, diarrhea, nausea and vomiting.  Endocrine: Negative for cold intolerance and heat intolerance.  Genitourinary: Negative for difficulty urinating, dysuria and hematuria.  Musculoskeletal: Positive for back pain, gait problem and myalgias. Negative for arthralgias, joint swelling, neck pain and neck stiffness.  Skin: Negative for color change, pallor, rash and wound.  Allergic/Immunologic: Positive for environmental allergies. Negative for food allergies.  Neurological: Negative for dizziness, tremors, seizures, syncope, facial asymmetry, speech difficulty, weakness, light-headedness, numbness and headaches.  Hematological: Negative for adenopathy. Does not bruise/bleed easily.   Psychiatric/Behavioral: Positive for sleep disturbance. Negative for agitation and confusion.       Objective:   Physical Exam  Constitutional: He is oriented to person, place, and time. Vital signs are normal. He appears well-developed and well-nourished. He is cooperative.  Non-toxic appearance. He does not have a sickly appearance. He does not appear ill. No distress.  HENT:  Head: Normocephalic and atraumatic.  Right Ear: Hearing and external ear normal.  Left Ear: Hearing and external ear normal.  Nose: Nose normal.  Mouth/Throat: Uvula is midline, oropharynx is clear and moist and mucous membranes are normal. No oropharyngeal exudate.  Eyes: Pupils are equal, round, and reactive to light. Conjunctivae, EOM and lids are normal. Right eye exhibits no discharge. Left eye exhibits no discharge. No scleral icterus.  Neck: Trachea normal, normal range of motion and phonation normal. Neck supple. No tracheal tenderness, no spinous process tenderness and no muscular tenderness present. No neck rigidity. No edema, no erythema and normal range of motion present.  Cardiovascular: Normal rate, regular rhythm, normal heart sounds and intact distal pulses.   Pulses:      Radial pulses are 2+ on the right side, and 2+ on the left side.  Pulmonary/Chest: Effort normal. No accessory muscle usage or stridor. No respiratory distress. He has no decreased breath sounds. He has no wheezes. He has no rhonchi. He has no rales. He exhibits no tenderness.  Abdominal: Soft. Normal appearance. He exhibits no distension, no fluid wave and no ascites. There is no rigidity and no guarding.  Musculoskeletal: He exhibits tenderness. He exhibits no edema or deformity.       Right shoulder: Normal.       Left shoulder: Normal.  Right elbow: Normal.      Left elbow: Normal.       Right hip: Normal.       Left hip: Normal.       Right knee: Normal.       Left knee: Normal.       Right ankle: Normal.       Left  ankle: Normal.       Cervical back: He exhibits normal range of motion, no tenderness, no bony tenderness, no swelling, no edema, no deformity, no laceration, no pain, no spasm and normal pulse.       Thoracic back: He exhibits normal range of motion, no tenderness, no bony tenderness, no swelling, no edema, no deformity, no laceration, no pain, no spasm and normal pulse.       Lumbar back: He exhibits decreased range of motion, tenderness, pain and spasm. He exhibits no bony tenderness, no swelling, no edema, no deformity, no laceration and normal pulse.       Back:       Right hand: Normal.       Left hand: Normal.  Left SI joint TTP and gluteus maxiumus left radiates down left posterior thight; lumbar extension worst pain along with left rotation, forward flexion can touch thighs; lateral bending okay no worsening of pain  Lymphadenopathy:       Head (right side): No submental, no submandibular, no tonsillar, no preauricular, no posterior auricular and no occipital adenopathy present.       Head (left side): No submental, no submandibular, no tonsillar, no preauricular, no posterior auricular and no occipital adenopathy present.    He has no cervical adenopathy.       Right cervical: No superficial cervical, no deep cervical and no posterior cervical adenopathy present.      Left cervical: No superficial cervical, no deep cervical and no posterior cervical adenopathy present.  Neurological: He is alert and oriented to person, place, and time. He has normal strength. He is not disoriented. He displays no atrophy, no tremor and normal reflexes. No cranial nerve deficit or sensory deficit. He exhibits normal muscle tone. He displays no seizure activity. Coordination and gait normal. GCS eye subscore is 4. GCS verbal subscore is 5. GCS motor subscore is 6.  Reflex Scores:      Patellar reflexes are 2+ on the right side and 2+ on the left side.      Achilles reflexes are 2+ on the right side and 2+  on the left side. On/off exam table; in/out of chair without difficulty; gait sure and steady in hall  Skin: Skin is warm, dry and intact. No rash noted. He is not diaphoretic. No cyanosis or erythema. No pallor. Nails show no clubbing.  Psychiatric: He has a normal mood and affect. His speech is normal and behavior is normal. Judgment and thought content normal. Cognition and memory are normal.  Nursing note and vitals reviewed.         Assessment & Plan:  A-sciatica right with radiculopathy acute  P-cyclobenazeprine/flexeril 10mg  po TID prn muscle spasms #30 RF0 dispensed from [facility]. Diclofenac 75mg  ER po BID prn pain #30 RF0 dispensed from [facility].  Avoid alcohol intake and driving while taking cyclobenazeprine/flexeril as drowsiness common side effect.  Do not use alprazolam when taking flexeril.  Diclofenac may counteract blood pressure medication and/or cause bruising.  If bruising develops stop diclofenac switch to tylenol 1000mg  po QID prn pain 8 UD given to patient from clinic  stock. Slow position changes as medication also lower blood pressure.  Home stretches demonstrated to patient-e.g. Arm circles, walking up wall, chest stretches, neck AROM, chin tucks, knee to chest and rock side to side on back. Self massage or professional prn, foam roller use or tennis/racquetball.  Heat/cryotherapy 15 minutes QID prn.  Trial thermacare low lumbar 1 applied and another given to patient for use tomorrow from clinic stock.  Doesn't remember his post surgery rehab exercises/stretches.  Consider physical therapy referral if no improvement with prescribed therapy from [facility] and/or chiropractic care.  Ensure ergonomics correct desk at work avoid repetitive motions if possible/holding phone/laptop in hand use desk/stand and/or break up lifting items into smaller loads/weights. Raise up his laptop consider standing instead of sitting if it is worsening pain.  Patient was instructed to rest, ice, and ROM exercises.   Activity as tolerated.   Follow up if symptoms persist or worsen especially if loss of bowel/bladder control, arm/leg weakness and/or saddle paresthesias.  Exitcare handout on muscle spasms and rehab exercises given to patient.  Patient verbalized agreement and understanding of treatment plan and had no further questions at this time.  P2:  Injury Prevention and Fitness.

## 2016-11-13 ENCOUNTER — Ambulatory Visit: Payer: Self-pay | Admitting: Registered Nurse

## 2016-11-13 VITALS — BP 143/95 | HR 88 | Temp 98.9°F

## 2016-11-13 DIAGNOSIS — S8002XA Contusion of left knee, initial encounter: Secondary | ICD-10-CM

## 2016-11-13 DIAGNOSIS — S86812A Strain of other muscle(s) and tendon(s) at lower leg level, left leg, initial encounter: Secondary | ICD-10-CM

## 2016-11-13 MED ORDER — ACETAMINOPHEN 500 MG PO TABS
1000.0000 mg | ORAL_TABLET | Freq: Four times a day (QID) | ORAL | 0 refills | Status: AC | PRN
Start: 2016-11-13 — End: 2016-11-20

## 2016-11-13 NOTE — Progress Notes (Signed)
Subjective:    Patient ID: Rodney Cisneros, male    DOB: [birth year], 51 y.o.   MRN: [id number]  51y/o married caucasian male established Pt reports L lateral knee pain x2 weeks, acute pain while playing volleyball went up for block and landed on his left knee.  Pain has persisted since then. Hurts to bear weight, and feels like it "gives way" while walking up stairs.  States supposed to play next week and wanted to ensure it was okay to do so for championship game.  Has tried ice and heat and elevating after work at home.  Will be floating to new area--pulling for the holidays also.      Review of Systems  Constitutional: Negative for activity change, appetite change, chills, diaphoresis, fatigue and fever.  HENT: Negative for trouble swallowing and voice change.   Eyes: Negative for photophobia and visual disturbance.  Respiratory: Negative for shortness of breath and wheezing.   Cardiovascular: Negative for chest pain and palpitations.  Gastrointestinal: Negative for diarrhea, nausea and vomiting.  Genitourinary: Negative for difficulty urinating and dysuria.  Musculoskeletal: Positive for joint swelling and myalgias. Negative for gait problem, neck pain and neck stiffness.  Skin: Positive for color change. Negative for pallor, rash and wound.  Allergic/Immunologic: Positive for environmental allergies. Negative for food allergies.  Neurological: Positive for weakness. Negative for dizziness, tremors, seizures, syncope, facial asymmetry, speech difficulty, light-headedness, numbness and headaches.  Hematological: Negative for adenopathy. Does not bruise/bleed easily.  Psychiatric/Behavioral: Negative for agitation, confusion and sleep disturbance.       Objective:   Physical Exam  Constitutional: He is oriented to person, place, and time. Vital signs are normal. He appears well-developed and well-nourished. He is active and cooperative.  Non-toxic appearance. He does not have a  sickly appearance. He does not appear ill. No distress.  HENT:  Head: Normocephalic and atraumatic.  Right Ear: Hearing and external ear normal.  Left Ear: Hearing and external ear normal.  Nose: Nose normal.  Mouth/Throat: Uvula is midline, oropharynx is clear and moist and mucous membranes are normal. No oropharyngeal exudate.  Eyes: Pupils are equal, round, and reactive to light. Conjunctivae, EOM and lids are normal. Right eye exhibits no discharge. Left eye exhibits no discharge. No scleral icterus.  Neck: Trachea normal, normal range of motion and phonation normal. Neck supple. No neck rigidity. No tracheal deviation, no edema, no erythema and normal range of motion present.  Cardiovascular: Normal rate, regular rhythm, normal heart sounds and intact distal pulses.   Pulses:      Radial pulses are 2+ on the right side, and 2+ on the left side.  Pulmonary/Chest: Effort normal and breath sounds normal. No accessory muscle usage or stridor. No respiratory distress. He has no decreased breath sounds. He has no wheezes. He has no rhonchi. He has no rales. He exhibits no tenderness.  Abdominal: Soft. Normal appearance. He exhibits no distension, no fluid wave and no ascites. There is no rigidity and no guarding.  Musculoskeletal: Normal range of motion. He exhibits edema and tenderness. He exhibits no deformity.       Right shoulder: Normal.       Left shoulder: Normal.       Right elbow: Normal.      Left elbow: Normal.       Right hip: Normal.       Left hip: Normal.       Right knee: Normal.       Left knee:  He exhibits swelling and ecchymosis. He exhibits normal range of motion, no effusion, no deformity, no laceration, no erythema, normal alignment, no LCL laxity, normal patellar mobility, no bony tenderness, normal meniscus and no MCL laxity. Tenderness found. Patellar tendon tenderness noted. No medial joint line, no lateral joint line, no MCL and no LCL tenderness noted.       Right  ankle: Normal.       Left ankle: Normal.       Cervical back: Normal.       Thoracic back: Normal.       Lumbar back: Normal.       Right hand: Normal.       Left hand: Normal.       Right upper leg: Normal.       Left upper leg: Normal.       Right lower leg: Normal.       Left lower leg: Normal.       Legs: On/off exam table; in/out of chair without difficulty; no limp observed in hall/clinic; negative lachmans/mcmurrays/valgus/varus stress; ecchymosis less than 2cm diameter faint purple; mildly ttp patellar tendon  Lymphadenopathy:    He has no cervical adenopathy.  Neurological: He is alert and oriented to person, place, and time. He is not disoriented. He displays no atrophy and no tremor. No cranial nerve deficit or sensory deficit. He exhibits normal muscle tone. He displays no seizure activity. Coordination and gait normal. GCS eye subscore is 4. GCS verbal subscore is 5. GCS motor subscore is 6.  Upper/lower extremity strength 5/5 equal bilaterally  Skin: Skin is warm, dry and intact. Bruising and ecchymosis noted. No abrasion, no burn, no laceration, no lesion, no petechiae, no purpura and no rash noted. Rash is not macular, not papular, not maculopapular, not nodular, not pustular, not vesicular and not urticarial. He is not diaphoretic. No cyanosis or erythema. No pallor. Nails show no clubbing.     Psychiatric: He has a normal mood and affect. His speech is normal and behavior is normal. Judgment and thought content normal. Cognition and memory are normal.  Nursing note and vitals reviewed.  Fitted and distributed large neoprene knee sleeve from clinic stock to patient.       Assessment & Plan:  A-contusion left knee initial; patellar strain left initial   P- Patient was instructed to rest, ice and elevate leg  Avoid high impact activities.  Wear neoprene sleeve for support.  Cryotherapy 15 minutes TID left knee.  Tylenol 1000mg  po QID prn pain.. Medications as directed.   Follow up falls/giving out/worsening swelling/bruising.  Exitcare handout knee strain, patellar tendonitis with rehab exercises and contusion. Call or return to clinic as needed if these symptoms worsen or fail to improve as anticipated. Patient verbalized agreement and understanding of treatment plan.  P2: ROM exercises, Stretching, and Hand out given

## 2016-11-13 NOTE — Patient Instructions (Signed)
Contusion A contusion is a deep bruise. Contusions are the result of a blunt injury to tissues and muscle fibers under the skin. The injury causes bleeding under the skin. The skin overlying the contusion may turn blue, purple, or yellow. Minor injuries will give you a painless contusion, but more severe contusions may stay painful and swollen for a few weeks. What are the causes? This condition is usually caused by a blow, trauma, or direct force to an area of the body. What are the signs or symptoms? Symptoms of this condition include:  Swelling of the injured area.  Pain and tenderness in the injured area.  Discoloration. The area may have redness and then turn blue, purple, or yellow.  How is this diagnosed? This condition is diagnosed based on a physical exam and medical history. An X-ray, CT scan, or MRI may be needed to determine if there are any associated injuries, such as broken bones (fractures). How is this treated? Specific treatment for this condition depends on what area of the body was injured. In general, the best treatment for a contusion is resting, icing, applying pressure to (compression), and elevating the injured area. This is often called the RICE strategy. Over-the-counter anti-inflammatory medicines may also be recommended for pain control. Follow these instructions at home:  Rest the injured area.  If directed, apply ice to the injured area: ? Put ice in a plastic bag. ? Place a towel between your skin and the bag. ? Leave the ice on for 20 minutes, 2-3 times per day.  If directed, apply light compression to the injured area using an elastic bandage. Make sure the bandage is not wrapped too tightly. Remove and reapply the bandage as directed by your health care provider.  If possible, raise (elevate) the injured area above the level of your heart while you are sitting or lying down.  Take over-the-counter and prescription medicines only as told by your health  care provider. Contact a health care provider if:  Your symptoms do not improve after several days of treatment.  Your symptoms get worse.  You have difficulty moving the injured area. Get help right away if:  You have severe pain.  You have numbness in a hand or foot.  Your hand or foot turns pale or cold. This information is not intended to replace advice given to you by your health care provider. Make sure you discuss any questions you have with your health care provider. Document Released: 10/09/2004 Document Revised: 05/10/2015 Document Reviewed: 05/17/2014 [employer] Interactive Patient Education  2017 [employer] Patellar Tendinitis Rehab Ask your health care provider which exercises are safe for you. Do exercises exactly as told by your health care provider and adjust them as directed. It is normal to feel mild stretching, pulling, tightness, or discomfort as you do these exercises, but you should stop right away if you feel sudden pain or your pain gets worse.Do not begin these exercises until told by your health care provider. Stretching and range of motion exercises This exercise warms up your muscles and joints and improves the movement and flexibility of your knee. This exercise also helps to relieve pain and stiffness. Exercise A: Hamstring, doorway  1. Lie on your back in front of a doorway with your __________ leg resting against the wall and your other leg flat on the floor in the doorway. There should be a slight bend in your __________ knee. 2. Straighten your __________ knee. You should feel a stretch behind your  knee or thigh. If you do not, scoot your buttocks closer to the door. 3. Hold this position for __________ seconds. Repeat __________ times. Complete this stretch __________ times a day. Strengthening exercises These exercises build strength and endurance in your knee. Endurance is the ability to use your muscles for a long time, even after they get  tired. Exercise B: Quadriceps, isometric  1. Lie on your back with your __________ leg extended and your other knee bent. 2. Slowly tense the muscles in the front of your __________ thigh. When you do this, you should see your kneecap slide up toward your hip or see increased dimpling just above the knee. This motion will push the back of your knee toward the floor. If this is painful, try putting a rolled-up hand towel under your knee to support it in a bent position. Change the size of the towel to find a position that allows you to do this exercise without any pain. 3. For __________ seconds, hold the muscle as tight as you can without increasing your pain. 4. Relax the muscles slowly and completely. Repeat __________ times. Complete this exercise __________ times a day. Exercise C: Straight leg raises ( quadriceps) 1. Lie on your back with your __________ leg extended and your other knee bent. 2. Tense the muscles in the front of your __________ thigh. When you do this, you should see your kneecap slide up or see increased dimpling just above the knee. 3. Keep these muscles tight as you raise your leg 4-6 inches (10-15 cm) off the floor. Do not let your moving knee bend. 4. Hold this position for __________ seconds. 5. Keep these muscles tense as you slowly lower your leg. 6. Relax your muscles slowly and completely. Repeat __________ times. Complete this exercise __________ times a day. Exercise D: Squats 1. Stand in front of a table, with your feet and knees pointing straight ahead. You may rest your hands on the table for balance but not for support. 2. Slowly bend your knees and lower your hips like you are going to sit in a chair. ? Keep your weight over your heels, not over your toes. ? Keep your lower legs upright so they are parallel with the table legs. ? Do not let your hips go lower than your knees. ? Do not bend lower than told by your health care provider. ? If your knee pain  increases, do not bend as low. 3. Hold the squat position for __________ seconds. 4. Slowly push with your legs to return to standing. Do not use your hands to pull yourself to standing. Repeat __________ times. Complete this exercise __________ times a day. Exercise E: Step-downs 1. Stand on the edge of a step. 2. Keeping your weight over your __________ heel, slowly bend your __________ knee to bring your __________ heel toward the floor. Lower your heel as far as you can while keeping control and without increasing any discomfort. ? Do not let your __________ knee come forward. ? Use your leg muscles, not gravity, to lower your body. ? Hold a wall or rail for balance if needed. 3. Slowly push through your heel to lift your body weight back up. 4. Return to the starting position. Repeat __________ times. Complete this exercise __________ times a day. Exercise F: Straight leg raises ( hip abductors) 1. Lie on your side with your __________ leg in the top position. Lie so your head, shoulder, knee, and hip line up. You may bend your  lower knee to help you keep your balance. 2. Roll your hips slightly forward, so that your hips are stacked directly over each other and your __________ knee is facing forward. 3. Leading with your heel, lift your top leg 4-6 inches (10-15 cm). You should feel the muscles in your outer hip lifting. ? Do not let your foot drift forward. ? Do not let your knee roll toward the ceiling. 4. Hold this position for __________ seconds. 5. Slowly lower your leg to the starting position. 6. Let your muscles relax completely after each repetition. Repeat __________ times. Complete this exercise __________ times a day. This information is not intended to replace advice given to you by your health care provider. Make sure you discuss any questions you have with your health care provider. Document Released: 12/30/2004 Document Revised: 09/06/2015 Document Reviewed:  10/03/2014 Else[employer] Interactive Patient Education  2018 [employer].  A knee sprain is a tear in one of the strong, fibrous tissues that connect the bones (ligaments) in your knee. The severity of the sprain depends on how much of the ligament is torn. The tear can be either partial or complete.  CAUSES  Often, sprains are a result of a fall or injury. The force of the impact causes the fibers of your ligament to stretch too much. This excess tension causes the fibers of your ligament to tear.  SIGNS AND SYMPTOMS  You may have some loss of motion in your knee. Other symptoms include:  . Bruising.  . Pain in the knee area.  . Tenderness of the knee to the touch.  . Swelling.   DIAGNOSIS  To diagnose a knee sprain, your health care provider will physically examine your knee. Your health care provider may also suggest an X-ray exam of your knee to make sure no bones are broken.  TREATMENT  If your ligament is only partially torn, treatment usually involves keeping the knee in a fixed position (immobilization) or bracing your knee for activities that require movement for several weeks. To do this, your health care provider will apply a bandage, cast, or splint to keep your knee from moving and to support your knee during movement until it heals. For a partially torn ligament, the healing process usually takes 4-6 weeks.  If your ligament is completely torn, depending on which ligament it is, you may need surgery to reconnect the ligament to the bone or reconstruct it. After surgery, a cast or splint may be applied and will need to stay on your knee for 4-6 weeks while your ligament heals.  HOME CARE INSTRUCTIONS  . Keep your injured knee elevated to decrease swelling.  . To ease pain and swelling, apply ice to the injured area: ? Put ice in a plastic bag.  ? Place a towel between your skin and the bag.  ? Leave the ice on for 20 minutes, 2-3 times a day.   . Only take medicine for pain as  directed by your health care provider.  . Do not leave your knee unprotected until pain and stiffness go away (usually 4-6 weeks).  . Your health care provider may suggest exercises for you to do during your recovery to prevent or limit permanent weakness and stiffness.   SEEK IMMEDIATE MEDICAL CARE IF:  . Your pain becomes worse.   MAKE SURE YOU:  . Understand these instructions.  . Will watch your condition.  . Will get help right away if you are not doing well or get  worse.   This information is not intended to replace advice given to you by your health care provider. Make sure you discuss any questions you have with your health care provider.  Document Released: 12/30/2004 Document Revised: 01/20/2014 Document Reviewed: 08/11/2012  [employer]vier Interactive Patient Education [employer].

## 2017-02-05 ENCOUNTER — Ambulatory Visit: Payer: Self-pay | Admitting: Registered Nurse

## 2017-02-05 VITALS — BP 145/99 | HR 80 | Temp 99.5°F

## 2017-02-05 DIAGNOSIS — J0101 Acute recurrent maxillary sinusitis: Secondary | ICD-10-CM

## 2017-02-05 DIAGNOSIS — H66004 Acute suppurative otitis media without spontaneous rupture of ear drum, recurrent, right ear: Secondary | ICD-10-CM

## 2017-02-05 DIAGNOSIS — J209 Acute bronchitis, unspecified: Secondary | ICD-10-CM

## 2017-02-05 DIAGNOSIS — H8111 Benign paroxysmal vertigo, right ear: Secondary | ICD-10-CM

## 2017-02-05 DIAGNOSIS — R030 Elevated blood-pressure reading, without diagnosis of hypertension: Secondary | ICD-10-CM

## 2017-02-05 MED ORDER — MECLIZINE HCL 25 MG PO TABS: 25.0000 mg | ORAL_TABLET | Freq: Three times a day (TID) | ORAL | 0 refills | Status: DC | PRN

## 2017-02-05 MED ORDER — SALINE SPRAY 0.65 % NA SOLN: 2.0000 | NASAL | 0 refills | Status: DC

## 2017-02-05 MED ORDER — AMOXICILLIN-POT CLAVULANATE 875-125 MG PO TABS: 1.0000 | ORAL_TABLET | Freq: Two times a day (BID) | ORAL | 0 refills | Status: AC

## 2017-02-05 MED ORDER — FLUTICASONE PROPIONATE 50 MCG/ACT NA SUSP: 1.0000 | Freq: Two times a day (BID) | NASAL | 6 refills | Status: DC

## 2017-02-05 MED ORDER — PREDNISONE 10 MG (21) PO TBPK: ORAL_TABLET | ORAL | 0 refills | Status: DC

## 2017-02-05 MED ORDER — ALBUTEROL SULFATE HFA 108 (90 BASE) MCG/ACT IN AERS: 1.0000 | INHALATION_SPRAY | RESPIRATORY_TRACT | 0 refills | Status: DC | PRN

## 2017-02-05 NOTE — Patient Instructions (Addendum)
Sinus Rinse What is a sinus rinse? A sinus rinse is a simple home treatment that is used to rinse your sinuses with a sterile mixture of salt and water (saline solution). Sinuses are air-filled spaces in your skull behind the bones of your face and forehead that open into your nasal cavity. You will use the following:  Saline solution.  Neti pot or spray bottle. This releases the saline solution into your nose and through your sinuses. Neti pots and spray bottles can be purchased at [employer], a health food store, or online.  When would I do a sinus rinse? A sinus rinse can help to clear mucus, dirt, dust, or pollen from the nasal cavity. You may do a sinus rinse when you have a cold, a virus, nasal allergy symptoms, a sinus infection, or stuffiness in the nose or sinuses. If you are considering a sinus rinse:  Ask your child's health care provider before performing a sinus rinse on your child.  Do not do a sinus rinse if you have had ear or nasal surgery, ear infection, or blocked ears.  How do I do a sinus rinse?  Wash your hands.  Disinfect your device according to the directions provided and then dry it.  Use the solution that comes with your device or one that is sold separately in stores. Follow the mixing directions on the package.  Fill your device with the amount of saline solution as directed by the device instructions.  Stand over a sink and tilt your head sideways over the sink.  Place the spout of the device in your upper nostril (the one closer to the ceiling).  Gently pour or squeeze the saline solution into the nasal cavity. The liquid should drain to the lower nostril if you are not overly congested.  Gently blow your nose. Blowing too hard may cause ear pain.  Repeat in the other nostril.  Clean and rinse your device with clean water and then air-dry it. Are there risks of a sinus rinse? Sinus rinse is generally very safe and effective. However,  there are a few risks, which include:  A burning sensation in the sinuses. This may happen if you do not make the saline solution as directed. Make sure to follow all directions when making the saline solution.  Infection from contaminated water. This is rare, but possible.  Nasal irritation.  This information is not intended to replace advice given to you by your health care provider. Make sure you discuss any questions you have with your health care provider. Document Released: 07/27/2013 Document Revised: 11/27/2015 Document Reviewed: 05/17/2013 Elsevier Interactive Patient Education  2017 [employer] Sinusitis, Adult Sinusitis is soreness and inflammation of your sinuses. Sinuses are hollow spaces in the bones around your face. Your sinuses are located:  Around your eyes.  In the middle of your forehead.  Behind your nose.  In your cheekbones.  Your sinuses and nasal passages are lined with a stringy fluid (mucus). Mucus normally drains out of your sinuses. When your nasal tissues become inflamed or swollen, the mucus can become trapped or blocked so air cannot flow through your sinuses. This allows bacteria, viruses, and funguses to grow, which leads to infection. Sinusitis can develop quickly and last for 7?10 days (acute) or for more than 12 weeks (chronic). Sinusitis often develops after a cold. What are the causes? This condition is caused by anything that creates swelling in the sinuses or stops mucus from draining, including:  Allergies.  Asthma.  Bacterial or viral infection.  Abnormally shaped bones between the nasal passages.  Nasal growths that contain mucus (nasal polyps).  Narrow sinus openings.  Pollutants, such as chemicals or irritants in the air.  A foreign object stuck in the nose.  A fungal infection. This is rare.  What increases the risk? The following factors may make you more likely to develop this condition:  Having allergies or  asthma.  Having had a recent cold or respiratory tract infection.  Having structural deformities or blockages in your nose or sinuses.  Having a weak immune system.  Doing a lot of swimming or diving.  Overusing nasal sprays.  Smoking.  What are the signs or symptoms? The main symptoms of this condition are pain and a feeling of pressure around the affected sinuses. Other symptoms include:  Upper toothache.  Earache.  Headache.  Bad breath.  Decreased sense of smell and taste.  A cough that may get worse at night.  Fatigue.  Fever.  Thick drainage from your nose. The drainage is often green and it may contain pus (purulent).  Stuffy nose or congestion.  Postnasal drip. This is when extra mucus collects in the throat or back of the nose.  Swelling and warmth over the affected sinuses.  Sore throat.  Sensitivity to light.  How is this diagnosed? This condition is diagnosed based on symptoms, a medical history, and a physical exam. To find out if your condition is acute or chronic, your health care provider may:  Look in your nose for signs of nasal polyps.  Tap over the affected sinus to check for signs of infection.  View the inside of your sinuses using an imaging device that has a light attached (endoscope).  If your health care provider suspects that you have chronic sinusitis, you may also:  Be tested for allergies.  Have a sample of mucus taken from your nose (nasal culture) and checked for bacteria.  Have a mucus sample examined to see if your sinusitis is related to an allergy.  If your sinusitis does not respond to treatment and it lasts longer than 8 weeks, you may have an MRI or CT scan to check your sinuses. These scans also help to determine how severe your infection is. In rare cases, a bone biopsy may be done to rule out more serious types of fungal sinus disease. How is this treated? Treatment for sinusitis depends on the cause and  whether your condition is chronic or acute. If a virus is causing your sinusitis, your symptoms will go away on their own within 10 days. You may be given medicines to relieve your symptoms, including:  Topical nasal decongestants. They shrink swollen nasal passages and let mucus drain from your sinuses.  Antihistamines. These drugs block inflammation that is triggered by allergies. This can help to ease swelling in your nose and sinuses.  Topical nasal corticosteroids. These are nasal sprays that ease inflammation and swelling in your nose and sinuses.  Nasal saline washes. These rinses can help to get rid of thick mucus in your nose.  If your condition is caused by bacteria, you will be given an antibiotic medicine. If your condition is caused by a fungus, you will be given an antifungal medicine. Surgery may be needed to correct underlying conditions, such as narrow nasal passages. Surgery may also be needed to remove polyps. Follow these instructions at home: Medicines  Take, use, or apply over-the-counter and prescription medicines only as told by  your health care provider. These may include nasal sprays.  If you were prescribed an antibiotic medicine, take it as told by your health care provider. Do not stop taking the antibiotic even if you start to feel better. Hydrate and Humidify  Drink enough water to keep your urine clear or pale yellow. Staying hydrated will help to thin your mucus.  Use a cool mist humidifier to keep the humidity level in your home above 50%.  Inhale steam for 10-15 minutes, 3-4 times a day or as told by your health care provider. You can do this in the bathroom while a hot shower is running.  Limit your exposure to cool or dry air. Rest  Rest as much as possible.  Sleep with your head raised (elevated).  Make sure to get enough sleep each night. General instructions  Apply a warm, moist washcloth to your face 3-4 times a day or as told by your  health care provider. This will help with discomfort.  Wash your hands often with soap and water to reduce your exposure to viruses and other germs. If soap and water are not available, use hand sanitizer.  Do not smoke. Avoid being around people who are smoking (secondhand smoke).  Keep all follow-up visits as told by your health care provider. This is important. Contact a health care provider if:  You have a fever.  Your symptoms get worse.  Your symptoms do not improve within 10 days. Get help right away if:  You have a severe headache.  You have persistent vomiting.  You have pain or swelling around your face or eyes.  You have vision problems.  You develop confusion.  Your neck is stiff.  You have trouble breathing. This information is not intended to replace advice given to you by your health care provider. Make sure you discuss any questions you have with your health care provider. Document Released: 12/30/2004 Document Revised: 08/26/2015 Document Reviewed: 10/25/2014 Elsevier Interactive Patient Education  2018 [employer]. How to Use a Metered Dose Inhaler A metered dose inhaler is a handheld device for taking medicine that must be breathed into the lungs (inhaled). The device can be used to deliver a variety of inhaled medicines, including:  Quick relief or rescue medicines, such as bronchodilators.  Controller medicines, such as corticosteroids.  The medicine is delivered by pushing down on a metal canister to release a preset amount of spray and medicine. Each device contains the amount of medicine that is needed for a preset number of uses (inhalations). Your health care provider may recommend that you use a spacer with your inhaler to help you take the medicine more effectively. A spacer is a plastic tube with a mouthpiece on one end and an opening that connects to the inhaler on the other end. A spacer holds the medicine in a tube for a short time, which  allows you to inhale more medicine. What are the risks? If you do not use your inhaler correctly, medicine might not reach your lungs to help you breathe. Inhaler medicine can cause side effects, such as:  Mouth or throat infection.  Cough.  Hoarseness.  Headache.  Nausea and vomiting.  Lung infection (pneumonia) in people who have a lung condition called COPD.  How to use a metered dose inhaler without a spacer 1. Remove the cap from the inhaler. 2. If you are using the inhaler for the first time, shake it for 5 seconds, turn it away from your face, then  release 4 puffs into the air. This is called priming. 3. Shake the inhaler for 5 seconds. 4. Position the inhaler so the top of the canister faces up. 5. Put your index finger on the top of the medicine canister. Support the bottom of the inhaler with your thumb. 6. Breathe out normally and as completely as possible, away from the inhaler. 7. Either place the inhaler between your teeth and close your lips tightly around the mouthpiece, or hold the inhaler 1-2 inches (2.5-5 cm) away from your open mouth. Keep your tongue down out of the way. If you are unsure which technique to use, ask your health care provider. 8. Press the canister down with your index finger to release the medicine, then inhale deeply and slowly through your mouth (not your nose) until your lungs are completely filled. Inhaling should take 4-6 seconds. 9. Hold the medicine in your lungs for 5-10 seconds (10 seconds is best). This helps the medicine get into the small airways of your lungs. 10. With your lips in a tight circle (pursed), breathe out slowly. 11. Repeat steps 3-10 until you have taken the number of puffs that your health care provider directed. Wait about 1 minute between puffs or as directed. 12. Put the cap on the inhaler. 13. If you are using a steroid inhaler, rinse your mouth with water, gargle, and spit out the water. Do not swallow the  water. How to use a metered dose inhaler with a spacer 1. Remove the cap from the inhaler. 2. If you are using the inhaler for the first time, shake it for 5 seconds, turn it away from your face, then release 4 puffs into the air. This is called priming. 3. Shake the inhaler for 5 seconds. 4. Place the open end of the spacer onto the inhaler mouthpiece. 5. Position the inhaler so the top of the canister faces up and the spacer mouthpiece faces you. 6. Put your index finger on the top of the medicine canister. Support the bottom of the inhaler and the spacer with your thumb. 7. Breathe out normally and as completely as possible, away from the spacer. 8. Place the spacer between your teeth and close your lips tightly around it. Keep your tongue down out of the way. 9. Press the canister down with your index finger to release the medicine, then inhale deeply and slowly through your mouth (not your nose) until your lungs are completely filled. Inhaling should take 4-6 seconds. 10. Hold the medicine in your lungs for 5-10 seconds (10 seconds is best). This helps the medicine get into the small airways of your lungs. 11. With your lips in a tight circle (pursed), breathe out slowly. 12. Repeat steps 3-11 until you have taken the number of puffs that your health care provider directed. Wait about 1 minute between puffs or as directed. 13. Remove the spacer from the inhaler and put the cap on the inhaler. 14. If you are using a steroid inhaler, rinse your mouth with water, gargle, and spit out the water. Do not swallow the water. Follow these instructions at home:  Take your inhaled medicine only as told by your health care provider. Do not use the inhaler more than directed by your health care provider.  Keep all follow-up visits as told by your health care provider. This is important.  If your inhaler has a counter, you can check it to determine how full your inhaler is. If your inhaler does not have  a counter, ask your health care provider when you will need to refill your inhaler and write the refill date on a calendar or on your inhaler canister. Note that you cannot know when an inhaler is empty by shaking it.  Follow directions on the package insert for care and cleaning of your inhaler and spacer. Contact a health care provider if:  Symptoms are only partially relieved with your inhaler.  You are having trouble using your inhaler.  You have an increase in phlegm.  You have headaches. Get help right away if:  You feel little or no relief after using your inhaler.  You have dizziness.  You have a fast heart rate.  You have chills or a fever.  You have night sweats.  There is blood in your phlegm. Summary  A metered dose inhaler is a handheld device for taking medicine that must be breathed into the lungs (inhaled).  The medicine is delivered by pushing down on a metal canister to release a preset amount of spray and medicine.  Each device contains the amount of medicine that is needed for a preset number of uses (inhalations). This information is not intended to replace advice given to you by your health care provider. Make sure you discuss any questions you have with your health care provider. Document Released: 12/30/2004 Document Revised: 11/20/2015 Document Reviewed: 11/20/2015 Elsevier Interactive Patient Education  2017 [employer]. Acute Bronchitis, Adult Acute bronchitis is sudden (acute) swelling of the air tubes (bronchi) in the lungs. Acute bronchitis causes these tubes to fill with mucus, which can make it hard to breathe. It can also cause coughing or wheezing. In adults, acute bronchitis usually goes away within 2 weeks. A cough caused by bronchitis may last up to 3 weeks. Smoking, allergies, and asthma can make the condition worse. Repeated episodes of bronchitis may cause further lung problems, such as chronic obstructive pulmonary disease (COPD). What  are the causes? This condition can be caused by germs and by substances that irritate the lungs, including:  Cold and flu viruses. This condition is most often caused by the same virus that causes a cold.  Bacteria.  Exposure to tobacco smoke, dust, fumes, and air pollution.  What increases the risk? This condition is more likely to develop in people who:  Have close contact with someone with acute bronchitis.  Are exposed to lung irritants, such as tobacco smoke, dust, fumes, and vapors.  Have a weak immune system.  Have a respiratory condition such as asthma.  What are the signs or symptoms? Symptoms of this condition include:  A cough.  Coughing up clear, yellow, or green mucus.  Wheezing.  Chest congestion.  Shortness of breath.  A fever.  Body aches.  Chills.  A sore throat.  How is this diagnosed? This condition is usually diagnosed with a physical exam. During the exam, your health care provider may order tests, such as chest X-rays, to rule out other conditions. He or she may also:  Test a sample of your mucus for bacterial infection.  Check the level of oxygen in your blood. This is done to check for pneumonia.  Do a chest X-ray or lung function testing to rule out pneumonia and other conditions.  Perform blood tests.  Your health care provider will also ask about your symptoms and medical history. How is this treated? Most cases of acute bronchitis clear up over time without treatment. Your health care provider may recommend:  Drinking more fluids. Drinking  more makes your mucus thinner, which may make it easier to breathe.  Taking a medicine for a fever or cough.  Taking an antibiotic medicine.  Using an inhaler to help improve shortness of breath and to control a cough.  Using a cool mist vaporizer or humidifier to make it easier to breathe.  Follow these instructions at home: Medicines  Take over-the-counter and prescription medicines  only as told by your health care provider.  If you were prescribed an antibiotic, take it as told by your health care provider. Do not stop taking the antibiotic even if you start to feel better. General instructions  Get plenty of rest.  Drink enough fluids to keep your urine clear or pale yellow.  Avoid smoking and secondhand smoke. Exposure to cigarette smoke or irritating chemicals will make bronchitis worse. If you smoke and you need help quitting, ask your health care provider. Quitting smoking will help your lungs heal faster.  Use an inhaler, cool mist vaporizer, or humidifier as told by your health care provider.  Keep all follow-up visits as told by your health care provider. This is important. How is this prevented? To lower your risk of getting this condition again:  Wash your hands often with soap and water. If soap and water are not available, use hand sanitizer.  Avoid contact with people who have cold symptoms.  Try not to touch your hands to your mouth, nose, or eyes.  Make sure to get the flu shot every year.  Contact a health care provider if:  Your symptoms do not improve in 2 weeks of treatment. Get help right away if:  You cough up blood.  You have chest pain.  You have severe shortness of breath.  You become dehydrated.  You faint or keep feeling like you are going to faint.  You keep vomiting.  You have a severe headache.  Your fever or chills gets worse. This information is not intended to replace advice given to you by your health care provider. Make sure you discuss any questions you have with your health care provider. Document Released: 02/07/2004 Document Revised: 07/25/2015 Document Reviewed: 06/20/2015 Elsevier Interactive Patient Education  2018 [employer].  Benign Positional Vertigo Vertigo is the feeling that you or your surroundings are moving when they are not. Benign positional vertigo is the most common form of vertigo. The  cause of this condition is not serious (is benign). This condition is triggered by certain movements and positions (is positional). This condition can be dangerous if it occurs while you are doing something that could endanger you or others, such as driving. What are the causes? In many cases, the cause of this condition is not known. It may be caused by a disturbance in an area of the inner ear that helps your brain to sense movement and balance. This disturbance can be caused by a viral infection (labyrinthitis), head injury, or repetitive motion. What increases the risk? This condition is more likely to develop in: Women. People who are 57 years of age or older.  What are the signs or symptoms? Symptoms of this condition usually happen when you move your head or your eyes in different directions. Symptoms may start suddenly, and they usually last for less than a minute. Symptoms may include: Loss of balance and falling. Feeling like you are spinning or moving. Feeling like your surroundings are spinning or moving. Nausea and vomiting. Blurred vision. Dizziness. Involuntary eye movement (nystagmus).  Symptoms can be mild  and cause only slight annoyance, or they can be severe and interfere with daily life. Episodes of benign positional vertigo may return (recur) over time, and they may be triggered by certain movements. Symptoms may improve over time. How is this diagnosed? This condition is usually diagnosed by medical history and a physical exam of the head, neck, and ears. You may be referred to a health care provider who specializes in ear, nose, and throat (ENT) problems (otolaryngologist) or a provider who specializes in disorders of the nervous system (neurologist). You may have additional testing, including: MRI. A CT scan. Eye movement tests. Your health care provider may ask you to change positions quickly while he or she watches you for symptoms of benign positional vertigo, such  as nystagmus. Eye movement may be tested with an electronystagmogram (ENG), caloric stimulation, the Dix-Hallpike test, or the roll test. An electroencephalogram (EEG). This records electrical activity in your brain. Hearing tests.  How is this treated? Usually, your health care provider will treat this by moving your head in specific positions to adjust your inner ear back to normal. Surgery may be needed in severe cases, but this is rare. In some cases, benign positional vertigo may resolve on its own in 2-4 weeks. Follow these instructions at home: Safety Move slowly.Avoid sudden body or head movements. Avoid driving. Avoid operating heavy machinery. Avoid doing any tasks that would be dangerous to you or others if a vertigo episode would occur. If you have trouble walking or keeping your balance, try using a cane for stability. If you feel dizzy or unstable, sit down right away. Return to your normal activities as told by your health care provider. Ask your health care provider what activities are safe for you. General instructions Take over-the-counter and prescription medicines only as told by your health care provider. Avoid certain positions or movements as told by your health care provider. Drink enough fluid to keep your urine clear or pale yellow. Keep all follow-up visits as told by your health care provider. This is important. Contact a health care provider if: You have a fever. Your condition gets worse or you develop new symptoms. Your family or friends notice any behavioral changes. Your nausea or vomiting gets worse. You have numbness or a "pins and needles" sensation. Get help right away if: You have difficulty speaking or moving. You are always dizzy. You faint. You develop severe headaches. You have weakness in your legs or arms. You have changes in your hearing or vision. You develop a stiff neck. You develop sensitivity to light. This information is not  intended to replace advice given to you by your health care provider. Make sure you discuss any questions you have with your health care provider. Document Released: 10/07/2005 Document Revised: 06/07/2015 Document Reviewed: 04/24/2014 Elsevier Interactive Patient Education  [employer].

## 2017-02-05 NOTE — Progress Notes (Signed)
Subjective:    Patient ID: Rodney Cisneros, male    DOB: 1965/02/03, 52 y.o.   MRN: [id number]  52y/o established caucasian male Pt c/o frontal and maxillary sinus pain and pressure, ear pressure (R) causing dizzy spells when he turns his head, sore throat, productive cough and chest congestion. Has used Dayquil, Nyquil, and Tylenol cold and sinus without relief.  + sick contacts at work similar symptoms.  Using his flonase and nasal saline also.  Does not have any meclizine at home.  Needs refill on his albuterol inhaler also.      Review of Systems  Constitutional: Positive for fatigue. Negative for activity change, appetite change, chills, diaphoresis, fever and unexpected weight change.  HENT: Positive for congestion, ear pain, postnasal drip, rhinorrhea, sinus pressure, sinus pain and sore throat. Negative for dental problem, drooling, ear discharge, facial swelling, hearing loss, mouth sores, nosebleeds, sneezing, tinnitus, trouble swallowing and voice change.   Eyes: Negative for photophobia, pain, discharge, redness, itching and visual disturbance.  Respiratory: Positive for cough, chest tightness and wheezing. Negative for choking, shortness of breath and stridor.   Cardiovascular: Negative for chest pain, palpitations and leg swelling.  Gastrointestinal: Negative for diarrhea, nausea and vomiting.  Endocrine: Negative for cold intolerance and heat intolerance.  Genitourinary: Negative for dysuria.  Musculoskeletal: Negative for back pain, gait problem, joint swelling, myalgias, neck pain and neck stiffness.  Skin: Negative for color change, pallor, rash and wound.  Allergic/Immunologic: Positive for environmental allergies. Negative for food allergies and immunocompromised state.  Neurological: Positive for dizziness. Negative for tremors, seizures, syncope, facial asymmetry, speech difficulty, weakness, light-headedness, numbness and headaches.  Hematological: Negative for  adenopathy. Does not bruise/bleed easily.  Psychiatric/Behavioral: Negative for agitation, confusion and sleep disturbance.       Objective:   Physical Exam  Constitutional: He is oriented to person, place, and time. He appears well-developed and well-nourished. He is active and cooperative.  Non-toxic appearance. He does not have a sickly appearance. He appears ill. No distress.  HENT:  Head: Normocephalic and atraumatic.  Right Ear: Hearing, external ear and ear canal normal. Tympanic membrane is erythematous and bulging. A middle ear effusion is present.  Left Ear: Hearing, external ear and ear canal normal. A middle ear effusion is present.  Nose: Mucosal edema and rhinorrhea present. No nose lacerations, sinus tenderness, nasal deformity, septal deviation or nasal septal hematoma. No epistaxis.  No foreign bodies. Right sinus exhibits maxillary sinus tenderness. Right sinus exhibits no frontal sinus tenderness. Left sinus exhibits maxillary sinus tenderness. Left sinus exhibits no frontal sinus tenderness.  Mouth/Throat: Uvula is midline and mucous membranes are normal. Mucous membranes are not pale, not dry and not cyanotic. He does not have dentures. No oral lesions. No trismus in the jaw. Normal dentition. No dental abscesses, uvula swelling, lacerations or dental caries. Posterior oropharyngeal edema and posterior oropharyngeal erythema present. No oropharyngeal exudate or tonsillar abscesses.  Cobblestoning posterior pharynx; right TM air fluid level clear central erythema bulging; left TM air fluid level clear; bilateral allergic shiners; bilateral nasal turbinates edema/erythema clear discharge  Eyes: Conjunctivae, EOM and lids are normal. Pupils are equal, round, and reactive to light. Right eye exhibits no chemosis, no discharge, no exudate and no hordeolum. No foreign body present in the right eye. Left eye exhibits no chemosis, no discharge, no exudate and no hordeolum. No foreign body  present in the left eye. Right conjunctiva is not injected. Right conjunctiva has no hemorrhage. Left conjunctiva is  not injected. Left conjunctiva has no hemorrhage. No scleral icterus. Right eye exhibits normal extraocular motion and no nystagmus. Left eye exhibits normal extraocular motion and no nystagmus. Right pupil is round and reactive. Left pupil is round and reactive. Pupils are equal.  Neck: Trachea normal, normal range of motion and phonation normal. Neck supple. No tracheal tenderness and no muscular tenderness present. No neck rigidity. No tracheal deviation, no edema, no erythema and normal range of motion present. No thyroid mass and no thyromegaly present.  Cardiovascular: Normal rate, regular rhythm, S1 normal, S2 normal, normal heart sounds and intact distal pulses. PMI is not displaced. Exam reveals no gallop and no friction rub.  No murmur heard. Pulmonary/Chest: Effort normal and breath sounds normal. No accessory muscle usage or stridor. No respiratory distress. He has no decreased breath sounds. He has no wheezes. He has no rhonchi. He has no rales.  Occasional intermittent nonproductive cough observed in exam room; spoke full sentences without difficulty  Abdominal: Soft. Normal appearance. He exhibits no distension, no fluid wave and no ascites. There is no rigidity and no guarding.  Musculoskeletal: Normal range of motion. He exhibits no edema or tenderness.       Right shoulder: Normal.       Left shoulder: Normal.       Right elbow: Normal.      Left elbow: Normal.       Right hip: Normal.       Left hip: Normal.       Right knee: Normal.       Left knee: Normal.       Cervical back: Normal.       Thoracic back: Normal.       Lumbar back: Normal.       Right hand: Normal.       Left hand: Normal.  Lymphadenopathy:       Head (right side): No submental, no submandibular, no tonsillar, no preauricular, no posterior auricular and no occipital adenopathy present.        Head (left side): No submental, no submandibular, no tonsillar, no preauricular, no posterior auricular and no occipital adenopathy present.    He has no cervical adenopathy.       Right cervical: No superficial cervical, no deep cervical and no posterior cervical adenopathy present.      Left cervical: No superficial cervical, no deep cervical and no posterior cervical adenopathy present.  Neurological: He is alert and oriented to person, place, and time. He has normal strength. He is not disoriented. He displays no atrophy and no tremor. No cranial nerve deficit or sensory deficit. He exhibits normal muscle tone. He displays no seizure activity. Coordination and gait normal. GCS eye subscore is 4. GCS verbal subscore is 5. GCS motor subscore is 6.  On/off exam table; in/out of chair without difficulty gait sure and steady hallway  Skin: Skin is warm, dry and intact. No abrasion, no bruising, no burn, no ecchymosis, no laceration, no lesion, no petechiae and no rash noted. He is not diaphoretic. No cyanosis or erythema. No pallor. Nails show no clubbing.  Psychiatric: He has a normal mood and affect. His speech is normal and behavior is normal. Judgment and thought content normal. Cognition and memory are normal.  Nursing note and vitals reviewed.         Assessment & Plan:  A-recurrent maxillary sinusitis, right otitis media acute, BPPV, acute bronchitis, elevated blood pressure  P-Continue flonase 1 spray each nostril  BID #1 RF6 electronic Rx; has at home saline 2 sprays each nostril q2h wa prn congestion.  If no improvement with 48 hours of prednisone, saline and flonase use start augmentin 875mg  po BID x 10 days #20 RF0 filled from [facility]  Denied personal or family history of ENT cancer.  Shower BID especially prior to bed. No evidence of systemic bacterial infection, non toxic and well hydrated.  I do not see where any further testing or imaging is necessary at this time.   I will suggest  supportive care, rest, good hygiene and encourage the patient to take adequate fluids.  The patient is to return to clinic or EMERGENCY ROOM if symptoms worsen or change significantly.  Exitcare handout on nonallergic rhinitis, sinusitis and sinus rinse given to patient.  Patient verbalized agreement and understanding of treatment plan and had no further questions at this time.   P2:  Hand washing and cover cough  Augmentin 875mg  po BID x 10 days #20 RF0 dispensed from [facility].  Supportive treatment.   No evidence of invasive bacterial infection, non toxic and well hydrated.  This is most likely self limiting viral infection.  I do not see where any further testing or imaging is necessary at this time.   I will suggest supportive care, rest, good hygiene and encourage the patient to take adequate fluids.  The patient is to return to clinic or EMERGENCY ROOM if symptoms worsen or change significantly e.g. ear pain, fever, purulent discharge from ears or bleeding.  Exitcare handout on otitis media given to patient.  Patient verbalized agreement and understanding of treatment plan.     Cough lozenges po q2h prn cough given 8 UD from clinic stock.  Prednisone taper 10mg  (60/50/40/30/20/10mg ) po daily with breakfast #21 RF0 dispensed from [facility].  Discussed possible side effects increased/decreased appetite, difficulty sleeping, increased blood sugar, increased blood pressure and heart rate.  Albuterol MDI 63mcg 1-2 puffs po q4-6h prn protracted cough/wheeze #1 RF0 side effect increased heart rate. Bronchitis simple, community acquired, may have started as viral (probably respiratory syncytial, parainfluenza, influenza, or adenovirus), but now evidence of acute purulent bronchitis with resultant bronchial edema and mucus formation.  Viruses are the most common cause of bronchial inflammation in otherwise healthy adults with acute bronchitis.  The appearance of sputum is not predictive of whether a bacterial infection is  present.  Purulent sputum is most often caused by viral infections.  There are a small portion of those caused by non-viral agents being Mycoplama pneumonia.  Microscopic examination or C&S of sputum in the healthy adult with acute bronchitis is generally not helpful (usually negative or normal respiratory flora) other considerations being cough from upper respiratory tract infections, sinusitis or allergic syndromes (mild asthma or viral pneumonia).  Differential Diagnoses:  reactive airway disease (asthma, allergic aspergillosis (eosinophilia), chronic bronchitis, respiratory infection (sinusitis, common cold, pneumonia), congestive heart failure, reflux esophagitis, bronchogenic tumor, aspiration syndromes and/or exposure to pulmonary irritants/smoke. Without high fever, severe dyspnea, lack of physical findings or other risk factors, I will hold on a chest radiograph and CBC at this time.  I discussed that approximately 50% of patients with acute bronchitis have a cough that lasts up to three weeks, and 25% for over a month.  Tylenol 500mg  one to two tablets every four to six hours as needed for fever or myalgias.  No aspirin. Exitcare handout on bronchitis and inhaler use given to patient.  ER if hemopthysis, SOB, worst chest pain of life.  Patient instructed to follow up in one week or sooner if symptoms worsen.  Patient verbalized agreement and understanding of treatment plan.  P2:  hand washing and cover cough  Elevated blood pressure probably due to acute illness and OTC cough and cold medication use.  Follow up for repeat blood pressure after antibiotics and prednisone course completed.  Patient verbalized understanding information/instructions, agreed with plan of care and had no further questions at this time.  claritin and flonase/saline not helping his vertigo symptoms. Stop OTC cough and cold medicines.  Continue flonase and saline nasal.  Switch to meclizine 25mg  po TID prn dizziness #30 RF0  electronic Rx given to his pharmacy of choice. stop claritin while on meclizine.  Slow position changes.  Consider not driving.  Discussed signs and symptoms of stroke with patient e.g. Extremity weakness, dysphagia/dysphasia.  If not improving with plan of care seek re-evaluation and consider ENT evaluation.  Probably related to otitis media with effusion and should resolve once this has cleared.  Exitcare handout on vertigo given to patient.  Patient verbalized understanding information/instructions, agreed with plan of care and had no further questions at this time. ugmentin prednisone taper albuterol flonase saline mecline

## 2017-04-16 ENCOUNTER — Telehealth: Payer: Self-pay | Admitting: Registered Nurse

## 2017-04-16 ENCOUNTER — Encounter: Payer: Self-pay | Admitting: Registered Nurse

## 2017-04-16 MED ORDER — OMEPRAZOLE 20 MG PO CPDR: 20.0000 mg | DELAYED_RELEASE_CAPSULE | Freq: Two times a day (BID) | ORAL | 0 refills | Status: AC

## 2017-04-16 MED ORDER — SALINE SPRAY 0.65 % NA SOLN: 2.0000 | NASAL | 0 refills | Status: AC

## 2017-04-16 NOTE — Telephone Encounter (Signed)
Patient has called GI and unable to get refill on his omeprazole 20mg  DR po BID s/p EGD last year with gastritis, hiatal hernia and GERD.  Bridge refill omeprazole 20mg  DR po BID #180 RF0 dispensed from [facility] to patient.  Discussed with him need new Rx from GI or PC[facility].  Consider magnesium level as none on file in the past year with annual check up.  Patient denied muscle cramps, palpitations, muscle weakness.  Patient verbalized understanding information/instructions, agreed with plan of care and had no further questions at this time

## 2017-06-11 ENCOUNTER — Ambulatory Visit: Payer: Self-pay | Admitting: Registered Nurse

## 2017-06-11 VITALS — BP 153/91 | HR 73 | Temp 98.6°F

## 2017-06-11 DIAGNOSIS — S161XXA Strain of muscle, fascia and tendon at neck level, initial encounter: Secondary | ICD-10-CM

## 2017-06-11 MED ORDER — MENTHOL (TOPICAL ANALGESIC) 4 % EX GEL: 1.0000 "application " | Freq: Four times a day (QID) | CUTANEOUS | 0 refills | Status: AC | PRN

## 2017-06-11 MED ORDER — CYCLOBENZAPRINE HCL 10 MG PO TABS: 5.0000 mg | ORAL_TABLET | Freq: Three times a day (TID) | ORAL | 0 refills | Status: AC | PRN

## 2017-06-11 MED ORDER — ACETAMINOPHEN 500 MG PO TABS: 1000.0000 mg | ORAL_TABLET | Freq: Four times a day (QID) | ORAL | 0 refills | Status: AC | PRN

## 2017-06-11 NOTE — Patient Instructions (Addendum)
Cervical Sprain A cervical sprain is a stretch or tear in the tissues that connect bones (ligaments) in the neck. Most neck (cervical) sprains get better in 4-6 weeks. Follow these instructions at home: If you have a neck collar:  Wear it as told by your doctor. Do not take off (do not remove) the collar unless your doctor says that this is safe.  Ask your doctor before adjusting your collar.  If you have long hair, keep it outside of the collar.  Ask your doctor if you may take off the collar for cleaning and bathing. If you may take off the collar: ? Follow instructions from your doctor about how to take off the collar safely. ? Clean the collar by wiping it with mild soap and water. Let it air-dry all the way. ? If your collar has removable pads:  Take the pads out every 1-2 days.  Hand wash the pads with soap and water.  Let the pads air-dry all the way before you put them back in the collar. Do not dry them in a clothes dryer. Do not dry them with a hair dryer. ? Check your skin under the collar for irritation or sores. If you see any, tell your doctor. Managing pain, stiffness, and swelling  Use a cervical traction device, if told by your doctor.  If told, put heat on the affected area. Do this before exercises (physical therapy) or as often as told by your doctor. Use the heat source that your doctor recommends, such as a moist heat pack or a heating pad. ? Place a towel between your skin and the heat source. ? Leave the heat on for 20-30 minutes. ? Take the heat off (remove the heat) if your skin turns bright red. This is very important if you cannot feel pain, heat, or cold. You may have a greater risk of getting burned.  Put ice on the affected area. ? Put ice in a plastic bag. ? Place a towel between your skin and the bag. ? Leave the ice on for 20 minutes, 2-3 times a day. Activity  Do not drive while wearing a neck collar. If you do not have a neck collar, ask your  doctor if it is safe to drive.  Do not drive or use heavy machinery while taking prescription pain medicine or muscle relaxants, unless your doctor approves.  Do not lift anything that is heavier than 10 lb (4.5 kg) until your doctor tells you that it is safe.  Rest as told by your doctor.  Avoid activities that make you feel worse. Ask your doctor what activities are safe for you.  Do exercises as told by your doctor or physical therapist. Preventing neck sprain  Practice good posture. Adjust your workstation to help with this, if needed.  Exercise regularly as told by your doctor or physical therapist.  Avoid activities that are risky or may cause a neck sprain (cervical sprain). General instructions  Take over-the-counter and prescription medicines only as told by your doctor.  Do not use any products that contain nicotine or tobacco. This includes cigarettes and e-cigarettes. If you need help quitting, ask your doctor.  Keep all follow-up visits as told by your doctor. This is important. Contact a doctor if:  You have pain or other symptoms that get worse.  You have symptoms that do not get better after 2 weeks.  You have pain that does not get better with medicine.  You start to have new,  unexplained symptoms.  You have sores or irritated skin from wearing your neck collar. Get help right away if:  You have very bad pain.  You have any of the following in any part of your body: ? Loss of feeling (numbness). ? Tingling. ? Weakness.  You cannot move a part of your body (you have paralysis).  Your activity level does not improve. Summary  A cervical sprain is a stretch or tear in the tissues that connect bones (ligaments) in the neck.  If you have a neck (cervical) collar, do not take off the collar unless your doctor says that this is safe.  Put ice on affected areas as told by your doctor.  Put heat on affected areas as told by your doctor.  Good posture  and regular exercise can help prevent a neck sprain from happening again. This information is not intended to replace advice given to you by your health care provider. Make sure you discuss any questions you have with your health care provider. Document Released: 06/18/2007 Document Revised: 09/11/2015 Document Reviewed: 09/11/2015 Elsevier Interactive Patient Education  2017 [employer] Cervical Strain and Sprain Rehab Ask your health care provider which exercises are safe for you. Do exercises exactly as told by your health care provider and adjust them as directed. It is normal to feel mild stretching, pulling, tightness, or discomfort as you do these exercises, but you should stop right away if you feel sudden pain or your pain gets worse.Do not begin these exercises until told by your health care provider. Stretching and range of motion exercises These exercises warm up your muscles and joints and improve the movement and flexibility of your neck. These exercises also help to relieve pain, numbness, and tingling. Exercise A: Cervical side bend  1. Using good posture, sit on a stable chair or stand up. 2. Without moving your shoulders, slowly tilt your left / right ear to your shoulder until you feel a stretch in your neck muscles. You should be looking straight ahead. 3. Hold for _____10_____ seconds. 4. Repeat with the other side of your neck. Repeat ______3____ times. Complete this exercise _____3_____ times a day. Exercise B: Cervical rotation  1. Using good posture, sit on a stable chair or stand up. 2. Slowly turn your head to the side as if you are looking over your left / right shoulder. ? Keep your eyes level with the ground. ? Stop when you feel a stretch along the side and the back of your neck. 3. Hold for _____10_____ seconds. 4. Repeat this by turning to your other side. Repeat _____3_____ times. Complete this exercise ____3______ times a day. Exercise C: Thoracic  extension and pectoral stretch 1. Roll a towel or a small blanket so it is about 4 inches (10 cm) in diameter. 2. Lie down on your back on a firm surface. 3. Put the towel lengthwise, under your spine in the middle of your back. It should not be not under your shoulder blades. The towel should line up with your spine from your middle back to your lower back. 4. Put your hands behind your head and let your elbows fall out to your sides. 5. Hold for ______10____ seconds. Repeat _____3_____ times. Complete this exercise _____3_____ times a day. Strengthening exercises These exercises build strength and endurance in your neck. Endurance is the ability to use your muscles for a long time, even after your muscles get tired. Exercise D: Upper cervical flexion, isometric 1. Lie on your  back with a thin pillow behind your head and a small rolled-up towel under your neck. 2. Gently tuck your chin toward your chest and nod your head down to look toward your feet. Do not lift your head off the pillow. 3. Hold for ______10____ seconds. 4. Release the tension slowly. Relax your neck muscles completely before you repeat this exercise. Repeat ____3______ times. Complete this exercise _____3_____ times a day. Exercise E: Cervical extension, isometric  1. Stand about 6 inches (15 cm) away from a wall, with your back facing the wall. 2. Place a soft object, about 6-8 inches (15-20 cm) in diameter, between the back of your head and the wall. A soft object could be a small pillow, a ball, or a folded towel. 3. Gently tilt your head back and press into the soft object. Keep your jaw and forehead relaxed. 4. Hold for ____10______ seconds. 5. Release the tension slowly. Relax your neck muscles completely before you repeat this exercise. Repeat ____3______ times. Complete this exercise ___3_______ times a day. Posture and body mechanics  Body mechanics refers to the movements and positions of your body while you do  your daily activities. Posture is part of body mechanics. Good posture and healthy body mechanics can help to relieve stress in your body's tissues and joints. Good posture means that your spine is in its natural S-curve position (your spine is neutral), your shoulders are pulled back slightly, and your head is not tipped forward. The following are general guidelines for applying improved posture and body mechanics to your everyday activities. Standing  When standing, keep your spine neutral and keep your feet about hip-width apart. Keep a slight bend in your knees. Your ears, shoulders, and hips should line up.  When you do a task in which you stand in one place for a long time, place one foot up on a stable object that is 2-4 inches (5-10 cm) high, such as a footstool. This helps keep your spine neutral. Sitting   When sitting, keep your spine neutral and your keep feet flat on the floor. Use a footrest, if necessary, and keep your thighs parallel to the floor. Avoid rounding your shoulders, and avoid tilting your head forward.  When working at a desk or a computer, keep your desk at a height where your hands are slightly lower than your elbows. Slide your chair under your desk so you are close enough to maintain good posture.  When working at a computer, place your monitor at a height where you are looking straight ahead and you do not have to tilt your head forward or downward to look at the screen. Resting When lying down and resting, avoid positions that are most painful for you. Try to support your neck in a neutral position. You can use a contour pillow or a small rolled-up towel. Your pillow should support your neck but not push on it. This information is not intended to replace advice given to you by your health care provider. Make sure you discuss any questions you have with your health care provider. Document Released: 12/30/2004 Document Revised: 09/06/2015 Document Reviewed:  12/06/2014 Elsevier Interactive Patient Education  2018 [place].  Radicular Pain Radicular pain is a type of pain that spreads from your back or neck along a spinal nerve. Spinal nerves are nerves that leave the spinal cord and go to the muscles. Radicular pain occurs when one of these nerves becomes irritated or squeezed (compressed). Radicular pain is sometimes called  radiculopathy, radiculitis, or a pinched nerve. When you have this type of pain, you may also have weakness, numbness, or tingling in the area of your body that is supplied by the nerve. The pain may feel sharp and burning. Spinal nerves leave the spinal cord through openings between the 24 bones (vertebrae) that make up the spine. Radicular pain is often caused by something pushing on a spinal nerve. This pushing may be done by a vertebra or by one of the round cushions between vertebrae (intervertebral disks). This can result from an injury, from wear and tear or aging of a disk, or from the growth of a bone spur that pushes on the nerve. Radicular pain can occur in various areas depending on which spinal nerve is affected:  Cervical radicular pain occurs in the neck. You may also feel pain, numbness, weakness, or tingling in the arms.  Thoracic radicular pain occurs in the mid-spine area. You would feel this pain in the back and chest. This type is rare.  Lumbar radicular pain occurs in the lower back area. You would feel this pain as low back pain. You may feel pain, numbness, weakness, or tingling in the buttocks or legs. Sciatica is a type of lumbar radicular pain that shoots down the back of the leg.  Radicular pain often goes away when you follow instructions from your health care provider for relieving pain at home. Follow these instructions at home: Managing pain  If directed, apply ice to the affected area: ? Put ice in a plastic bag. ? Place a towel between your skin and the bag. ? Leave the ice on for 20  minutes, 2-3 times a day.  If directed, apply heat to the affected area as often as told by your health care provider. Use the heat source that your health care provider recommends, such as a moist heat pack or a heating pad. ? Place a towel between your skin and the heat source. ? Leave the heat on for 20-30 minutes. ? Remove the heat if your skin turns bright red. This is especially important if you are unable to feel pain, heat, or cold. You may have a greater risk of getting burned. Activity   Do not sit or rest in bed for long periods of time.  Try to stay as active as possible. Ask your health care provider what type of exercise or activity is best for you.  Avoid activities that make your pain worse, such as bending and lifting.  Do not lift anything that is heavier than 10 lb (4.5 kg). Practice using proper technique when lifting items. Proper lifting technique involves bending your knees and rising up.  Do strength and range-of-motion exercises only as told by your health care provider. General instructions  Take over-the-counter and prescription medicines only as told by your health care provider.  Pay attention to any changes in your symptoms.  Keep all follow-up visits as told by your health care provider. This is important. Contact a health care provider if:  Your pain and other symptoms get worse.  Your pain medicine is not helping.  Your pain has not improved after a few weeks of home care.  You have a fever. Get help right away if:  You have severe pain, weakness, or numbness.  You have difficulty with bladder or bowel control. This information is not intended to replace advice given to you by your health care provider. Make sure you discuss any questions you have with your  health care provider. Document Released: 02/07/2004 Document Revised: 06/07/2015 Document Reviewed: 07/26/2014 Elsevier Interactive Patient Education  [employer].

## 2017-06-11 NOTE — Progress Notes (Signed)
Subjective:    Patient ID: Rodney Cisneros, male    DOB: 11-05-1965, 52 y.o.   MRN: [id number]  52 y/o Caucasian male pt established with clinic c/o L neck and shoulder pain that began 2 days ago immediately after lifting a fairly heavy box, maybe 60-70lbs, from floor to table.  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 Pain was initially focused at top of L shoulder, but yesterday began radiating down L back, L buttock and L posterior thigh. Describes a "weakness not necessarily a pain" radiating down L arm with lifting; Has been using heating pad and Aleve back and muscle with some relief. Would like refill on flexeril it helps him to sleep ran out from last fill Oct 2018 [facility].  Has not tried biofreeze needs some more.  Has tried to rest, heat and aleve.  Denied loss of bowel/bladder control, saddle paresthesias or actual arm/leg weakness/falls/dropping items.     Review of Systems  Constitutional: Negative for activity change, appetite change, chills, diaphoresis, fatigue and fever.  HENT: Negative for trouble swallowing and voice change.   Eyes: Negative for photophobia and visual disturbance.  Respiratory: Negative for cough, shortness of breath, wheezing and stridor.   Cardiovascular: Negative for chest pain.  Gastrointestinal: Negative for diarrhea, nausea and vomiting.  Endocrine: Negative for cold intolerance and heat intolerance.  Genitourinary: Negative for difficulty urinating and enuresis.  Musculoskeletal: Positive for back pain, myalgias and neck pain. Negative for arthralgias, gait problem, joint swelling and neck stiffness.  Skin: Negative for color change, pallor, rash and wound.  Allergic/Immunologic: Negative for food allergies.  Neurological: Negative for dizziness, tremors, seizures,  syncope, facial asymmetry, speech difficulty, weakness, light-headedness, numbness and headaches.  Hematological: Negative for adenopathy. Does not bruise/bleed easily.  Psychiatric/Behavioral: Positive for sleep disturbance. Negative for agitation and confusion.       Objective:   Physical Exam  Constitutional: He is oriented to person, place, and time. Vital signs are normal. He appears well-developed and well-nourished. He is active and cooperative.  Non-toxic appearance. He does not have a sickly appearance. He does not appear ill. No distress.  HENT:  Head: Normocephalic and atraumatic.  Right Ear: Hearing and external ear normal.  Left Ear: Hearing and external ear normal.  Nose: Nose normal.  Mouth/Throat: Uvula is midline, oropharynx is clear and moist and mucous membranes are normal. No oropharyngeal exudate.  Eyes: Pupils are equal, round, and reactive to light. Conjunctivae, EOM and lids are normal. Right eye exhibits no discharge. Left eye exhibits no discharge. No scleral icterus.  Neck: Trachea normal and phonation normal. Neck supple. Muscular tenderness present. No tracheal tenderness and no spinous process tenderness present. No neck rigidity. Decreased range of motion present. No tracheal deviation, no edema and no erythema present.    Bilateral trapezius muscles tight and mildly TTP; left lateral bending and rotation painful but rotation and lateral bending right worse; paraspinals not ttp c/t/l spine; full bilateral shoulder arom with left suprascapular pain with arom but no limitation of AROM; negative neers/atchley scratch/empty beer can/cross body/internal/external rotation;   Cardiovascular: Normal rate, regular rhythm, S1 normal, S2 normal, normal heart sounds and intact distal pulses. Exam reveals no gallop, no distant heart sounds and no friction rub.  Pulses:  Radial pulses are 2+ on the right side, and 2+ on the left side.  Pulmonary/Chest: Effort normal and  breath sounds normal. No stridor. No respiratory distress. He has no decreased breath sounds. He has no wheezes. He has no rhonchi. He has no rales. He exhibits no tenderness.  Abdominal: Soft. Normal appearance. He exhibits no distension and no fluid wave. There is no rigidity and no guarding.  Musculoskeletal: He exhibits tenderness. He exhibits no edema or deformity.       Right shoulder: Normal.       Left shoulder: Normal.       Right elbow: Normal.      Left elbow: Normal.       Right hip: Normal.       Left hip: Normal.       Right knee: Normal.       Left knee: Normal.       Cervical back: Normal.       Thoracic back: Normal.       Lumbar back: Normal.       Right hand: Normal.       Left hand: Normal.       Legs: Lymphadenopathy:       Head (right side): No submental, no submandibular, no tonsillar, no preauricular, no posterior auricular and no occipital adenopathy present.       Head (left side): No submental, no submandibular, no tonsillar, no preauricular, no posterior auricular and no occipital adenopathy present.    He has no cervical adenopathy.       Right cervical: No superficial cervical, no deep cervical and no posterior cervical adenopathy present.      Left cervical: No superficial cervical, no deep cervical and no posterior cervical adenopathy present.  Neurological: He is alert and oriented to person, place, and time. He has normal strength. He is not disoriented. He displays no atrophy, no tremor and normal reflexes. No cranial nerve deficit or sensory deficit. He exhibits normal muscle tone. He displays no seizure activity. Coordination and gait normal. GCS eye subscore is 4. GCS verbal subscore is 5. GCS motor subscore is 6.  Reflex Scores:      Brachioradialis reflexes are 2+ on the right side and 2+ on the left side.      Patellar reflexes are 2+ on the right side and 2+ on the left side.      Achilles reflexes are 2+ on the right side and 2+ on the left  side. Normal heel toe gait; on/off exam table/in/out of chair without difficulty; bilateral hand grasp/upper and lower extremity strength equal 5/5   Skin: Skin is warm, dry and intact. Capillary refill takes less than 2 seconds. No abrasion, no bruising, no ecchymosis, no petechiae and no rash noted. He is not diaphoretic. No cyanosis or erythema. No pallor. Nails show no clubbing.  Psychiatric: He has a normal mood and affect. His speech is normal and behavior is normal. Judgment and thought content normal. He is not actively hallucinating. Cognition and memory are normal. He is attentive.  Nursing note and vitals reviewed.    thermacare cervical applied to left scapular and trapezius from clinic stock.     Assessment & Plan:  A-cervical strain initial encounter with radicular pain left arm/leg  P-cyclobenazeprine/flexeril 5-10mg  po TID prn muscle spasms #30 RF0 dispensed from [facility].  Tylenol 1000mg  po QID prn pain.  Only if breakthrough pain take ibuprofen 800mg  po TID prn pain at home as counteracts his blood  pressure medication.  Biofreeze gel topical QID prn pain.  Avoid alcohol intake and driving while taking cyclobenazeprine/flexeril as drowsiness common side effect.  Slow position changes as medication also lower blood pressure.  Home stretches demonstrated to patient-e.g. Arm circles, walking up wall, chest stretches, neck AROM, chin tucks, knee to chest and rock side to side on back. Self massage or professional prn, foam roller use or tennis/racquetball.  Heat/cryotherapy 15 minutes QID prn.  Trial thermacare 1 applied and may return to [employer] for another tomorrow from clinic stock.  Consider physical therapy referral if no improvement with prescribed therapy from [facility] and/or chiropractic care.  Ensure ergonomics correct desk at work avoid repetitive motions if possible/holding phone/laptop in hand use desk/stand and/or break up lifting items into smaller loads/weights.  Patient was  instructed to rest, ice, and ROM exercises.  Activity as tolerated.   Follow up if symptoms persist or worsen especially if loss of bowel/bladder control, arm/leg weakness and/or saddle paresthesias.  ER if red flags arm/leg weakness, loss of bowel bladder control or saddle paresthesias for same day evaluation. Exitcare handout on cervical strain/rehab exercises and cervical sprain/radicular pain.  Patient verbalized agreement and understanding of treatment plan and had no further questions at this time.  P2:  Injury Prevention and Fitness.

## 2017-06-16 ENCOUNTER — Ambulatory Visit: Payer: Self-pay | Admitting: Registered Nurse

## 2017-06-16 VITALS — BP 131/88 | HR 82 | Temp 98.8°F

## 2017-06-16 DIAGNOSIS — B9789 Other viral agents as the cause of diseases classified elsewhere: Principal | ICD-10-CM

## 2017-06-16 DIAGNOSIS — J069 Acute upper respiratory infection, unspecified: Secondary | ICD-10-CM

## 2017-06-16 DIAGNOSIS — J019 Acute sinusitis, unspecified: Secondary | ICD-10-CM

## 2017-06-16 MED ORDER — AMOXICILLIN 875 MG PO TABS: 875.0000 mg | ORAL_TABLET | Freq: Two times a day (BID) | ORAL | 0 refills | Status: AC

## 2017-06-16 NOTE — Patient Instructions (Addendum)
Sinus Rinse What is a sinus rinse? A sinus rinse is a simple home treatment that is used to rinse your sinuses with a sterile mixture of salt and water (saline solution). Sinuses are air-filled spaces in your skull behind the bones of your face and forehead that open into your nasal cavity. You will use the following:  Saline solution.  Neti pot or spray bottle. This releases the saline solution into your nose and through your sinuses. Neti pots and spray bottles can be purchased at [employer], a health food store, or online.  When would I do a sinus rinse? A sinus rinse can help to clear mucus, dirt, dust, or pollen from the nasal cavity. You may do a sinus rinse when you have a cold, a virus, nasal allergy symptoms, a sinus infection, or stuffiness in the nose or sinuses. If you are considering a sinus rinse:  Ask your child's health care provider before performing a sinus rinse on your child.  Do not do a sinus rinse if you have had ear or nasal surgery, ear infection, or blocked ears.  How do I do a sinus rinse?  Wash your hands.  Disinfect your device according to the directions provided and then dry it.  Use the solution that comes with your device or one that is sold separately in stores. Follow the mixing directions on the package.  Fill your device with the amount of saline solution as directed by the device instructions.  Stand over a sink and tilt your head sideways over the sink.  Place the spout of the device in your upper nostril (the one closer to the ceiling).  Gently pour or squeeze the saline solution into the nasal cavity. The liquid should drain to the lower nostril if you are not overly congested.  Gently blow your nose. Blowing too hard may cause ear pain.  Repeat in the other nostril.  Clean and rinse your device with clean water and then air-dry it. Are there risks of a sinus rinse? Sinus rinse is generally very safe and effective. However,  there are a few risks, which include:  A burning sensation in the sinuses. This may happen if you do not make the saline solution as directed. Make sure to follow all directions when making the saline solution.  Infection from contaminated water. This is rare, but possible.  Nasal irritation.  This information is not intended to replace advice given to you by your health care provider. Make sure you discuss any questions you have with your health care provider. Document Released: 07/27/2013 Document Revised: 11/27/2015 Document Reviewed: 05/17/2013 Elsevier Interactive Patient Education  2017 [place]. Viral Respiratory Infection A respiratory infection is an illness that affects part of the respiratory system, such as the lungs, nose, or throat. Most respiratory infections are caused by either viruses or bacteria. A respiratory infection that is caused by a virus is called a viral respiratory infection. Common types of viral respiratory infections include:  A cold.  The flu (influenza).  A respiratory syncytial virus (RSV) infection.  How do I know if I have a viral respiratory infection? Most viral respiratory infections cause:  A stuffy or runny nose.  Yellow or green nasal discharge.  A cough.  Sneezing.  Fatigue.  Achy muscles.  A sore throat.  Sweating or chills.  A fever.  A headache.  How are viral respiratory infections treated? If influenza is diagnosed early, it may be treated with an antiviral medicine that shortens  the length of time a person has symptoms. Symptoms of viral respiratory infections may be treated with over-the-counter and prescription medicines, such as:  Expectorants. These make it easier to cough up mucus.  Decongestant nasal sprays.  Health care providers do not prescribe antibiotic medicines for viral infections. This is because antibiotics are designed to kill bacteria. They have no effect on viruses. How do I know if I should  stay home from work or school? To avoid exposing others to your respiratory infection, stay home if you have:  A fever.  A persistent cough.  A sore throat.  A runny nose.  Sneezing.  Muscles aches.  Headaches.  Fatigue.  Weakness.  Chills.  Sweating.  Nausea.  Follow these instructions at home:  Rest as much as possible.  Take over-the-counter and prescription medicines only as told by your health care provider.  Drink enough fluid to keep your urine clear or pale yellow. This helps prevent dehydration and helps loosen up mucus.  Gargle with a salt-water mixture 3-4 times per day or as needed. To make a salt-water mixture, completely dissolve -1 tsp of salt in 1 cup of warm water.  Use nose drops made from salt water to ease congestion and soften raw skin around your nose.  Do not drink alcohol.  Do not use tobacco products, including cigarettes, chewing tobacco, and e-cigarettes. If you need help quitting, ask your health care provider. Contact a health care provider if:  Your symptoms last for 10 days or longer.  Your symptoms get worse over time.  You have a fever.  You have severe sinus pain in your face or forehead.  The glands in your jaw or neck become very swollen. Get help right away if:  You feel pain or pressure in your chest.  You have shortness of breath.  You faint or feel like you will faint.  You have severe and persistent vomiting.  You feel confused or disoriented. This information is not intended to replace advice given to you by your health care provider. Make sure you discuss any questions you have with your health care provider. Document Released: 10/09/2004 Document Revised: 06/07/2015 Document Reviewed: 06/07/2014 Elsevier Interactive Patient Education  2018 [place] [employer].  Pharyngitis Pharyngitis is redness, pain, and swelling (inflammation) of the throat (pharynx). It is a very common cause of sore throat. Pharyngitis  can be caused by a bacteria, but it is usually caused by a virus. Most cases of pharyngitis get better on their own without treatment. What are the causes? This condition may be caused by: Infection by viruses (viral). Viral pharyngitis spreads from person to person (is contagious) through coughing, sneezing, and sharing of personal items or utensils such as cups, forks, spoons, and toothbrushes. Infection by bacteria (bacterial). Bacterial pharyngitis may be spread by touching the nose or face after coming in contact with the bacteria, or through more intimate contact, such as kissing. Allergies. Allergies can cause buildup of mucus in the throat (post-nasal drip), leading to inflammation and irritation. Allergies can also cause blocked nasal passages, forcing breathing through the mouth, which dries and irritates the throat.  What increases the risk? You are more likely to develop this condition if: You are 70-84 years old. You are exposed to crowded environments such as daycare, school, or dormitory living. You live in a cold climate. You have a weakened disease-fighting (immune) system.  What are the signs or symptoms? Symptoms of this condition vary by the cause (viral, bacterial, or allergies)  and can include: Sore throat. Fatigue. Low-grade fever. Headache. Joint pain and muscle aches. Skin rashes. Swollen glands in the throat (lymph nodes). Plaque-like film on the throat or tonsils. This is often a symptom of bacterial pharyngitis. Vomiting. Stuffy nose (nasal congestion). Cough. Red, itchy eyes (conjunctivitis). Loss of appetite.  How is this diagnosed? This condition is often diagnosed based on your medical history and a physical exam. Your health care provider will ask you questions about your illness and your symptoms. A swab of your throat may be done to check for bacteria (rapid strep test). Other lab tests may also be done, depending on the suspected cause, but these are  rare. How is this treated? This condition usually gets better in 3-4 days without medicine. Bacterial pharyngitis may be treated with antibiotic medicines. Follow these instructions at home: Take over-the-counter and prescription medicines only as told by your health care provider. If you were prescribed an antibiotic medicine, take it as told by your health care provider. Do not stop taking the antibiotic even if you start to feel better. Do not give children aspirin because of the association with Reye syndrome. Drink enough water and fluids to keep your urine clear or pale yellow. Get a lot of rest. Gargle with a salt-water mixture 3-4 times a day or as needed. To make a salt-water mixture, completely dissolve -1 tsp of salt in 1 cup of warm water. If your health care provider approves, you may use throat lozenges or sprays to soothe your throat. Contact a health care provider if: You have large, tender lumps in your neck. You have a rash. You cough up green, yellow-brown, or bloody spit. Get help right away if: Your neck becomes stiff. You drool or are unable to swallow liquids. You cannot drink or take medicines without vomiting. You have severe pain that does not go away, even after you take medicine. You have trouble breathing, and it is not caused by a stuffy nose. You have new pain and swelling in your joints such as the knees, ankles, wrists, or elbows. Summary Pharyngitis is redness, pain, and swelling (inflammation) of the throat (pharynx). While pharyngitis can be caused by a bacteria, the most common causes are viral. Most cases of pharyngitis get better on their own without treatment. Bacterial pharyngitis is treated with antibiotic medicines. This information is not intended to replace advice given to you by your health care provider. Make sure you discuss any questions you have with your health care provider. Document Released: 12/30/2004 Document Revised: 02/05/2016  Document Reviewed: 02/05/2016 Elsevier Interactive Patient Education  [employer].

## 2017-06-16 NOTE — Progress Notes (Signed)
Subjective:    Patient ID: Rodney Cisneros, male    DOB: 07-Feb-1965, 52 y.o.   MRN: [id number]  52y/o Caucasian male established patient well known to clinic c/o sore throat (worst of my life), productive cough with green-brown mucus, painful to speak and swallow. Sx started 1.5 days ago. Took Nyquil last night to help sleep. Not sure if it helped actual sore throat sx.  Has been using phenylephrine 10mg  po prn, saline nasal BID and flonase.  Showering prior to going to bed.  Feeling worn out/fatigued. Feeling more in his head symptoms than chest symptoms.   + sick contacts at work  Last seen 06/11/2017 for cervical strain and patient reported those symptoms have resolved and off muscle relaxants.     Review of Systems  Constitutional: Positive for fatigue. Negative for activity change, appetite change, chills, diaphoresis and fever.  HENT: Positive for congestion, postnasal drip, rhinorrhea, sinus pressure, sinus pain, sneezing and sore throat. Negative for dental problem, drooling, ear discharge, ear pain, facial swelling, hearing loss, mouth sores, nosebleeds, trouble swallowing and voice change.   Eyes: Negative for photophobia and visual disturbance.  Respiratory: Positive for cough. Negative for choking, chest tightness, shortness of breath, wheezing and stridor.   Cardiovascular: Negative for chest pain and palpitations.  Gastrointestinal: Negative for constipation, diarrhea, nausea and vomiting.  Endocrine: Negative for cold intolerance and heat intolerance.  Genitourinary: Negative for difficulty urinating.  Musculoskeletal: Negative for arthralgias, back pain, gait problem, joint swelling, myalgias, neck pain and neck stiffness.  Skin: Negative for color change, pallor, rash and wound.  Allergic/Immunologic: Positive for environmental allergies. Negative for food allergies.  Neurological: Negative for dizziness, tremors, seizures, syncope, facial asymmetry, speech difficulty,  weakness, light-headedness, numbness and headaches.  Hematological: Negative for adenopathy. Does not bruise/bleed easily.  Psychiatric/Behavioral: Positive for sleep disturbance. Negative for agitation and confusion.       Objective:   Physical Exam  Constitutional: He is oriented to person, place, and time. Vital signs are normal. He appears well-developed and well-nourished. He is active and cooperative.  Non-toxic appearance. He does not have a sickly appearance. He appears ill. No distress.  HENT:  Head: Normocephalic and atraumatic.  Right Ear: Hearing, external ear and ear canal normal. A middle ear effusion is present.  Left Ear: Hearing, external ear and ear canal normal. A middle ear effusion is present.  Nose: Mucosal edema and rhinorrhea present. No nose lacerations, sinus tenderness, nasal deformity, septal deviation or nasal septal hematoma. No epistaxis.  No foreign bodies. Right sinus exhibits maxillary sinus tenderness and frontal sinus tenderness. Left sinus exhibits maxillary sinus tenderness and frontal sinus tenderness.  Mouth/Throat: Uvula is midline and mucous membranes are normal. Mucous membranes are not pale, not dry and not cyanotic. He does not have dentures. No oral lesions. No trismus in the jaw. Normal dentition. No dental abscesses, uvula swelling, lacerations or dental caries. Posterior oropharyngeal edema and posterior oropharyngeal erythema present. No oropharyngeal exudate or tonsillar abscesses. Tonsils are 1+ on the right. Tonsils are 1+ on the left. No tonsillar exudate.  Maxillary greater than frontal sinus ttp bilaterally; bilateral TMs air fluid level clear; bilateral allergic shiners and lower eyelid swelling 1+/4; clear discharge bilateral nasal turbinates, frequent nose clearing in exam room; cobblestoning posterior pharynx; macular erythema oropharynx  Eyes: Pupils are equal, round, and reactive to light. Conjunctivae, EOM and lids are normal. Right eye  exhibits no chemosis, no discharge, no exudate and no hordeolum. No foreign body present  in the right eye. Left eye exhibits no chemosis, no discharge, no exudate and no hordeolum. No foreign body present in the left eye. Right conjunctiva is not injected. Right conjunctiva has no hemorrhage. Left conjunctiva is not injected. Left conjunctiva has no hemorrhage. No scleral icterus. Right eye exhibits normal extraocular motion and no nystagmus. Left eye exhibits normal extraocular motion and no nystagmus. Right pupil is round and reactive. Left pupil is round and reactive. Pupils are equal.  Neck: Trachea normal, normal range of motion and phonation normal. Neck supple. No tracheal tenderness, no spinous process tenderness and no muscular tenderness present. No neck rigidity. No tracheal deviation, no edema, no erythema and normal range of motion present. No thyroid mass and no thyromegaly present.  Cardiovascular: Normal rate, regular rhythm, S1 normal, S2 normal, normal heart sounds and intact distal pulses. PMI is not displaced. Exam reveals no gallop, no distant heart sounds and no friction rub.  No murmur heard. Pulmonary/Chest: Effort normal and breath sounds normal. No stridor. No respiratory distress. He has no decreased breath sounds. He has no wheezes. He has no rhonchi. He has no rales.  Intermittent mild nonproductive cough in exam room; spoke full sentences without difficulty; Sp02 held at 98% RA with cough and speech  Abdominal: Soft. Normal appearance. He exhibits no distension, no fluid wave and no ascites. There is no rigidity and no guarding.  Musculoskeletal: Normal range of motion. He exhibits no edema or tenderness.       Right shoulder: Normal.       Left shoulder: Normal.       Right elbow: Normal.      Left elbow: Normal.       Right hip: Normal.       Left hip: Normal.       Right knee: Normal.       Left knee: Normal.       Cervical back: Normal.       Thoracic back: Normal.        Lumbar back: Normal.       Right hand: Normal.       Left hand: Normal.  Lymphadenopathy:       Head (right side): No submental, no submandibular, no tonsillar, no preauricular, no posterior auricular and no occipital adenopathy present.       Head (left side): No submental, no submandibular, no tonsillar, no preauricular, no posterior auricular and no occipital adenopathy present.    He has no cervical adenopathy.       Right cervical: No superficial cervical, no deep cervical and no posterior cervical adenopathy present.      Left cervical: No superficial cervical, no deep cervical and no posterior cervical adenopathy present.  Anterior cervical superficial TTP bilaterally no nodules/shotiness palpated  Neurological: He is alert and oriented to person, place, and time. He has normal strength. He is not disoriented. He displays no atrophy and no tremor. No cranial nerve deficit or sensory deficit. He exhibits normal muscle tone. He displays no seizure activity. Coordination and gait normal. GCS eye subscore is 4. GCS verbal subscore is 5. GCS motor subscore is 6.  On/off exam table/in/out of chair without difficulty; gait sure and steady in hallway  Skin: Skin is warm, dry and intact. Capillary refill takes less than 2 seconds. No abrasion, no bruising, no burn, no ecchymosis, no laceration, no lesion, no petechiae and no rash noted. He is not diaphoretic. No cyanosis or erythema. No pallor. Nails show no clubbing.  Psychiatric: He has a normal mood and affect. His speech is normal and behavior is normal. Judgment and thought content normal. He is not actively hallucinating. Cognition and memory are normal. He is attentive.  Nursing note and vitals reviewed.         Assessment & Plan:  A-viral URI with cough; acute rhinosinusitis  P-Restart flonase 1 spray each nostril BID at home, cough lozenges po q2h wa given 8 UD from clinic stock prn cough, saline 2 sprays each nostril q2h wa prn  congestion given 1 bottle from clinic stock.  Continue phenylephrine 10mg  po q6h prn rhinitis. May use otc cough medicine per manufacturer's instructions If no improvement with 48 hours of saline and flonase use start amoxicillin 875mg  po BID x 10 days #20 RF0 dispensed from [facility].  Consider prednisone 10mg  po taper if no relief.  Discussed salt gargles, honey, soups salty, nondairy smoothies/popsicles. Tylenol 1000mg  po QID prn pain  Discussed Viral illness in community typically lasting 7 days.  Denied personal or family history of ENT cancer.  Shower BID especially prior to bed. No evidence of systemic bacterial infection, non toxic and well hydrated.  I do not see where any further testing or imaging is necessary at this time.   I will suggest supportive care, rest, good hygiene and encourage the patient to take adequate fluids.  The patient is to return to clinic or EMERGENCY ROOM if symptoms worsen or change significantly or sinus pressure/pain does not resolve with plan of care.  Exitcare handout on viral uri, sinusitis and sinus rinse.  Patient verbalized agreement and understanding of treatment plan and had no further questions at this time.   P2:  Hand washing and cover cough

## 2017-06-18 ENCOUNTER — Encounter: Payer: Self-pay | Admitting: Registered Nurse

## 2017-06-18 ENCOUNTER — Telehealth: Payer: Self-pay | Admitting: Registered Nurse

## 2017-06-18 MED ORDER — PREDNISONE 10 MG (21) PO TBPK: ORAL_TABLET | ORAL | 0 refills | Status: DC

## 2017-06-18 NOTE — Telephone Encounter (Signed)
Throat still hurting denied fever/dysphagia/dyspnea/dysphasia.  Discussed to start amoxillin 875mg  po BID and prednisone 10mg  po taper with breakfast daily (60/50/40/30/20/10mg ) dispensed from [facility] to patient.  Follow up if new or worsening symptoms for re-evaluation especially drooling,dyspnea/dysphagia/dysphasia/hemoptysis.  Reinforced continue plan of care as discussed at last appt regarding nasal saline, singulair, claritin, flonase and albuterol use prn, showering BID, hydrating and tylenol 1000mg  po QID prn pain/salt water rinses/fruit smoothies/iced noncaffeinated beverages.  Patient verbalized understanding information/instructions, agreed with plan of care and had no further questions at this time.

## 2017-06-25 ENCOUNTER — Ambulatory Visit: Payer: Self-pay | Admitting: Registered Nurse

## 2017-06-25 VITALS — BP 158/88 | HR 80 | Temp 98.2°F

## 2017-06-25 DIAGNOSIS — R197 Diarrhea, unspecified: Secondary | ICD-10-CM

## 2017-06-25 DIAGNOSIS — H8110 Benign paroxysmal vertigo, unspecified ear: Secondary | ICD-10-CM

## 2017-06-25 DIAGNOSIS — E860 Dehydration: Secondary | ICD-10-CM

## 2017-06-25 MED ORDER — MECLIZINE HCL 25 MG PO TABS: 25.0000 mg | ORAL_TABLET | Freq: Four times a day (QID) | ORAL | 0 refills | Status: AC | PRN

## 2017-06-25 MED ORDER — ONDANSETRON HCL 4 MG PO TABS: 4.0000 mg | ORAL_TABLET | Freq: Two times a day (BID) | ORAL | 0 refills | Status: AC | PRN

## 2017-06-25 NOTE — Patient Instructions (Signed)
Vertigo Vertigo is the feeling that you or your surroundings are moving when they are not. Vertigo can be dangerous if it occurs while you are doing something that could endanger you or others, such as driving. What are the causes? This condition is caused by a disturbance in the signals that are sent by your body's sensory systems to your brain. Different causes of a disturbance can lead to vertigo, including:  Infections, especially in the inner ear.  A bad reaction to a drug, or misuse of alcohol and medicines.  Withdrawal from drugs or alcohol.  Quickly changing positions, as when lying down or rolling over in bed.  Migraine headaches.  Decreased blood flow to the brain.  Decreased blood pressure.  Increased pressure in the brain from a head or neck injury, stroke, infection, tumor, or bleeding.  Central nervous system disorders.  What are the signs or symptoms? Symptoms of this condition usually occur when you move your head or your eyes in different directions. Symptoms may start suddenly, and they usually last for less than a minute. Symptoms may include:  Loss of balance and falling.  Feeling like you are spinning or moving.  Feeling like your surroundings are spinning or moving.  Nausea and vomiting.  Blurred vision or double vision.  Difficulty hearing.  Slurred speech.  Dizziness.  Involuntary eye movement (nystagmus).  Symptoms can be mild and cause only slight annoyance, or they can be severe and interfere with daily life. Episodes of vertigo may return (recur) over time, and they are often triggered by certain movements. Symptoms may improve over time. How is this diagnosed? This condition may be diagnosed based on medical history and the quality of your nystagmus. Your health care provider may test your eye movements by asking you to quickly change positions to trigger the nystagmus. This may be called the Dix-Hallpike test, head thrust test, or roll test.  You may be referred to a health care provider who specializes in ear, nose, and throat (ENT) problems (otolaryngologist) or a provider who specializes in disorders of the central nervous system (neurologist). You may have additional testing, including:  A physical exam.  Blood tests.  MRI.  A CT scan.  An electrocardiogram (ECG). This records electrical activity in your heart.  An electroencephalogram (EEG). This records electrical activity in your brain.  Hearing tests.  How is this treated? Treatment for this condition depends on the cause and the severity of the symptoms. Treatment options include:  Medicines to treat nausea or vertigo. These are usually used for severe cases. Some medicines that are used to treat other conditions may also reduce or eliminate vertigo symptoms. These include: ? Medicines that control allergies (antihistamines). ? Medicines that control seizures (anticonvulsants). ? Medicines that relieve depression (antidepressants). ? Medicines that relieve anxiety (sedatives).  Head movements to adjust your inner ear back to normal. If your vertigo is caused by an ear problem, your health care provider may recommend certain movements to correct the problem.  Surgery. This is rare.  Follow these instructions at home: Safety  Move slowly.Avoid sudden body or head movements.  Avoid driving.  Avoid operating heavy machinery.  Avoid doing any tasks that would cause danger to you or others if you would have a vertigo episode during the task.  If you have trouble walking or keeping your balance, try using a cane for stability. If you feel dizzy or unstable, sit down right away.  Return to your normal activities as told by your  health care provider. Ask your health care provider what activities are safe for you. General instructions  Take over-the-counter and prescription medicines only as told by your health care provider.  Avoid certain positions or  movements as told by your health care provider.  Drink enough fluid to keep your urine clear or pale yellow.  Keep all follow-up visits as told by your health care provider. This is important. Contact a health care provider if:  Your medicines do not relieve your vertigo or they make it worse.  You have a fever.  Your condition gets worse or you develop new symptoms.  Your family or friends notice any behavioral changes.  Your nausea or vomiting gets worse.  You have numbness or a "pins and needles" sensation in part of your body. Get help right away if:  You have difficulty moving or speaking.  You are always dizzy.  You faint.  You develop severe headaches.  You have weakness in your hands, arms, or legs.  You have changes in your hearing or vision.  You develop a stiff neck.  You develop sensitivity to light. This information is not intended to replace advice given to you by your health care provider. Make sure you discuss any questions you have with your health care provider. Document Released: 10/09/2004 Document Revised: 06/13/2015 Document Reviewed: 04/24/2014 Elsevier Interactive Patient Education  2018 [employer]. Dehydration, Adult Dehydration is a condition in which there is not enough fluid or water in the body. This happens when you lose more fluids than you take in. Important organs, such as the kidneys, brain, and heart, cannot function without a proper amount of fluids. Any loss of fluids from the body can lead to dehydration. Dehydration can range from mild to severe. This condition should be treated right away to prevent it from becoming severe. What are the causes? This condition may be caused by:  Vomiting.  Diarrhea.  Excessive sweating, such as from heat exposure or exercise.  Not drinking enough fluid, especially: ? When ill. ? While doing activity that requires a lot of energy.  Excessive urination.  Fever.  Infection.  Certain  medicines, such as medicines that cause the body to lose excess fluid (diuretics).  Inability to access safe drinking water.  Reduced physical ability to get adequate water and food.  What increases the risk? This condition is more likely to develop in people:  Who have a poorly controlled long-term (chronic) illness, such as diabetes, heart disease, or kidney disease.  Who are age 53 or older.  Who are disabled.  Who live in a place with high altitude.  Who play endurance sports.  What are the signs or symptoms? Symptoms of mild dehydration may include:  Thirst.  Dry lips.  Slightly dry mouth.  Dry, warm skin.  Dizziness. Symptoms of moderate dehydration may include:  Very dry mouth.  Muscle cramps.  Dark urine. Urine may be the color of tea.  Decreased urine production.  Decreased tear production.  Heartbeat that is irregular or faster than normal (palpitations).  Headache.  Light-headedness, especially when you stand up from a sitting position.  Fainting (syncope). Symptoms of severe dehydration may include:  Changes in skin, such as: ? Cold and clammy skin. ? Blotchy (mottled) or pale skin. ? Skin that does not quickly return to normal after being lightly pinched and released (poor skin turgor).  Changes in body fluids, such as: ? Extreme thirst. ? No tear production. ? Inability to sweat when body  temperature is high, such as in hot weather. ? Very little urine production.  Changes in vital signs, such as: ? Weak pulse. ? Pulse that is more than 100 beats a minute when sitting still. ? Rapid breathing. ? Low blood pressure.  Other changes, such as: ? Sunken eyes. ? Cold hands and feet. ? Confusion. ? Lack of energy (lethargy). ? Difficulty waking up from sleep. ? Short-term weight loss. ? Unconsciousness. How is this diagnosed? This condition is diagnosed based on your symptoms and a physical exam. Blood and urine tests may be done to  help confirm the diagnosis. How is this treated? Treatment for this condition depends on the severity. Mild or moderate dehydration can often be treated at home. Treatment should be started right away. Do not wait until dehydration becomes severe. Severe dehydration is an emergency and it needs to be treated in a hospital. Treatment for mild dehydration may include:  Drinking more fluids.  Replacing salts and minerals in your blood (electrolytes) that you may have lost. Treatment for moderate dehydration may include:  Drinking an oral rehydration solution (ORS). This is a drink that helps you replace fluids and electrolytes (rehydrate). It can be found at pharmacies and retail stores. Treatment for severe dehydration may include:  Receiving fluids through an IV tube.  Receiving an electrolyte solution through a feeding tube that is passed through your nose and into your stomach (nasogastric tube, or NG tube).  Correcting any abnormalities in electrolytes.  Treating the underlying cause of dehydration. Follow these instructions at home:  If directed by your health care provider, drink an ORS: ? Make an ORS by following instructions on the package. ? Start by drinking small amounts, about  cup (120 mL) every 5-10 minutes. ? Slowly increase how much you drink until you have taken the amount recommended by your health care provider.  Drink enough clear fluid to keep your urine clear or pale yellow. If you were told to drink an ORS, finish the ORS first, then start slowly drinking other clear fluids. Drink fluids such as: ? Water. Do not drink only water. Doing that can lead to having too little salt (sodium) in the body (hyponatremia). ? Ice chips. ? Fruit juice that you have added water to (diluted fruit juice). ? Low-calorie sports drinks.  Avoid: ? Alcohol. ? Drinks that contain a lot of sugar. These include high-calorie sports drinks, fruit juice that is not diluted, and  soda. ? Caffeine. ? Foods that are greasy or contain a lot of fat or sugar.  Take over-the-counter and prescription medicines only as told by your health care provider.  Do not take sodium tablets. This can lead to having too much sodium in the body (hypernatremia).  Eat foods that contain a healthy balance of electrolytes, such as bananas, oranges, potatoes, tomatoes, and spinach.  Keep all follow-up visits as told by your health care provider. This is important. Contact a health care provider if:  You have abdominal pain that: ? Gets worse. ? Stays in one area (localizes).  You have a rash.  You have a stiff neck.  You are more irritable than usual.  You are sleepier or more difficult to wake up than usual.  You feel weak or dizzy.  You feel very thirsty.  You have urinated only a small amount of very dark urine over 6-8 hours. Get help right away if:  You have symptoms of severe dehydration.  You cannot drink fluids without vomiting.  Your  symptoms get worse with treatment.  You have a fever.  You have a severe headache.  You have vomiting or diarrhea that: ? Gets worse. ? Does not go away.  You have blood or green matter (bile) in your vomit.  You have blood in your stool. This may cause stool to look black and tarry.  You have not urinated in 6-8 hours.  You faint.  Your heart rate while sitting still is over 100 beats a minute.  You have trouble breathing. This information is not intended to replace advice given to you by your health care provider. Make sure you discuss any questions you have with your health care provider. Document Released: 12/30/2004 Document Revised: 07/27/2015 Document Reviewed: 02/23/2015 Elsevier Interactive Patient Education  2018 [employer]. Viral Gastroenteritis, Adult Viral gastroenteritis is also known as the stomach flu. This condition is caused by certain germs (viruses). These germs can be passed from person to  person very easily (are very contagious). This condition can cause sudden watery poop (diarrhea), fever, and throwing up (vomiting). Having watery poop and throwing up can make you feel weak and cause you to get dehydrated. Dehydration can make you tired and thirsty, make you have a dry mouth, and make it so you pee (urinate) less often. Older adults and people with other diseases or a weak defense system (immune system) are at higher risk for dehydration. It is important to replace the fluids that you lose from having watery poop and throwing up. Follow these instructions at home: Follow instructions from your doctor about how to care for yourself at home. Eating and drinking  Follow these instructions as told by your doctor:  Take an oral rehydration solution (ORS). This is a drink that is sold at pharmacies and stores.  Drink clear fluids in small amounts as you are able, such as: ? Water. ? Ice chips. ? Diluted fruit juice. ? Low-calorie sports drinks.  Eat bland, easy-to-digest foods in small amounts as you are able, such as: ? Bananas. ? Applesauce. ? Rice. ? Low-fat (lean) meats. ? Toast. ? Crackers.  Avoid fluids that have a lot of sugar or caffeine in them.  Avoid alcohol.  Avoid spicy or fatty foods.  General instructions  Drink enough fluid to keep your pee (urine) clear or pale yellow.  Wash your hands often. If you cannot use soap and water, use hand sanitizer.  Make sure that all people in your home wash their hands well and often.  Rest at home while you get better.  Take over-the-counter and prescription medicines only as told by your doctor.  Watch your condition for any changes.  Take a warm bath to help with any burning or pain from having watery poop.  Keep all follow-up visits as told by your doctor. This is important. Contact a doctor if:  You cannot keep fluids down.  Your symptoms get worse.  You have new symptoms.  You feel light-headed  or dizzy.  You have muscle cramps. Get help right away if:  You have chest pain.  You feel very weak or you pass out (faint).  You see blood in your throw-up.  Your throw-up looks like coffee grounds.  You have bloody or black poop (stools) or poop that look like tar.  You have a very bad headache, a stiff neck, or both.  You have a rash.  You have very bad pain, cramping, or bloating in your belly (abdomen).  You have trouble breathing.  You  are breathing very quickly.  Your heart is beating very quickly.  Your skin feels cold and clammy.  You feel confused.  You have pain when you pee.  You have signs of dehydration, such as: ? Dark pee, hardly any pee, or no pee. ? Cracked lips. ? Dry mouth. ? Sunken eyes. ? Sleepiness. ? Weakness. This information is not intended to replace advice given to you by your health care provider. Make sure you discuss any questions you have with your health care provider. Document Released: 06/18/2007 Document Revised: 07/20/2015 Document Reviewed: 09/05/2014 Elsevier Interactive Patient Education  2017 [employer] Choices to Help Relieve Diarrhea, Adult When you have diarrhea, the foods you eat and your eating habits are very important. Choosing the right foods and drinks can help:  Relieve diarrhea.  Replace lost fluids and nutrients.  Prevent dehydration.  What general guidelines should I follow? Relieving diarrhea  Choose foods with less than 2 g or .07 oz. of fiber per serving.  Limit fats to less than 8 tsp (38 g or 1.34 oz.) a day.  Avoid the following: ? Foods and beverages sweetened with high-fructose corn syrup, honey, or sugar alcohols such as xylitol, sorbitol, and mannitol. ? Foods that contain a lot of fat or sugar. ? Fried, greasy, or spicy foods. ? High-fiber grains, breads, and cereals. ? Raw fruits and vegetables.  Eat foods that are rich in probiotics. These foods include dairy products such as  yogurt and fermented milk products. They help increase healthy bacteria in the stomach and intestines (gastrointestinal tract, or GI tract).  If you have lactose intolerance, avoid dairy products. These may make your diarrhea worse.  Take medicine to help stop diarrhea (antidiarrheal medicine) only as told by your health care provider. Replacing nutrients  Eat small meals or snacks every 3-4 hours.  Eat bland foods, such as white rice, toast, or baked potato, until your diarrhea starts to get better. Gradually reintroduce nutrient-rich foods as tolerated or as told by your health care provider. This includes: ? Well-cooked protein foods. ? Peeled, seeded, and soft-cooked fruits and vegetables. ? Low-fat dairy products.  Take vitamin and mineral supplements as told by your health care provider. Preventing dehydration   Start by sipping water or a special solution to prevent dehydration (oral rehydration solution, ORS). Urine that is clear or pale yellow means that you are getting enough fluid.  Try to drink at least 8-10 cups of fluid each day to help replace lost fluids.  You may add other liquids in addition to water, such as clear juice or decaffeinated sports drinks, as tolerated or as told by your health care provider.  Avoid drinks with caffeine, such as coffee, tea, or soft drinks.  Avoid alcohol. What foods are recommended? The items listed may not be a complete list. Talk with your health care provider about what dietary choices are best for you. Grains White rice. White, [place], or pita breads (fresh or toasted), including plain rolls, buns, or bagels. White pasta. Saltine, soda, or graham crackers. Pretzels. Low-fiber cereal. Cooked cereals made with water (such as cornmeal, farina, or cream cereals). Plain muffins. Matzo. Melba toast. Zwieback. Vegetables Potatoes (without the skin). Most well-cooked and canned vegetables without skins or seeds. Tender  lettuce. Fruits Apple sauce. Fruits canned in juice. Cooked apricots, cherries, grapefruit, peaches, pears, or plums. Fresh bananas and cantaloupe. Meats and other protein foods Baked or boiled chicken. Eggs. Tofu. Fish. Seafood. Smooth nut butters. Ground or well-cooked tender  beef, ham, veal, lamb, pork, or poultry. Dairy Plain yogurt, kefir, and unsweetened liquid yogurt. Lactose-free milk, buttermilk, skim milk, or soy milk. Low-fat or nonfat hard cheese. Beverages Water. Low-calorie sports drinks. Fruit juices without pulp. Strained tomato and vegetable juices. Decaffeinated teas. Sugar-free beverages not sweetened with sugar alcohols. Oral rehydration solutions, if approved by your health care provider. Seasoning and other foods Bouillon, broth, or soups made from recommended foods. What foods are not recommended? The items listed may not be a complete list. Talk with your health care provider about what dietary choices are best for you. Grains Whole grain, whole wheat, bran, or rye breads, rolls, pastas, and crackers. Wild or brown rice. Whole grain or bran cereals. Barley. Oats and oatmeal. Corn tortillas or taco shells. Granola. Popcorn. Vegetables Raw vegetables. Fried vegetables. Cabbage, broccoli, Brussels sprouts, artichokes, baked beans, beet greens, corn, kale, legumes, peas, sweet potatoes, and yams. Potato skins. Cooked spinach and cabbage. Fruits Dried fruit, including raisins and dates. Raw fruits. Stewed or dried prunes. Canned fruits with syrup. Meat and other protein foods Fried or fatty meats. Deli meats. Chunky nut butters. Nuts and seeds. Beans and lentils. Berniece Salines. Hot dogs. Sausage. Dairy High-fat cheeses. Whole milk, chocolate milk, and beverages made with milk, such as milk shakes. Half-and-half. Cream. sour cream. Ice cream. Beverages Caffeinated beverages (such as coffee, tea, soda, or energy drinks). Alcoholic beverages. Fruit juices with pulp. Prune juice. Soft  drinks sweetened with high-fructose corn syrup or sugar alcohols. High-calorie sports drinks. Fats and oils Butter. Cream sauces. Margarine. Salad oils. Plain salad dressings. Olives. Avocados. Mayonnaise. Sweets and desserts Sweet rolls, doughnuts, and sweet breads. Sugar-free desserts sweetened with sugar alcohols such as xylitol and sorbitol. Seasoning and other foods Honey. Hot sauce. Chili powder. Gravy. Cream-based or milk-based soups. Pancakes and waffles. Summary  When you have diarrhea, the foods you eat and your eating habits are very important.  Make sure you get at least 8-10 cups of fluid each day, or enough to keep your urine clear or pale yellow.  Eat bland foods and gradually reintroduce healthy, nutrient-rich foods as tolerated, or as told by your health care provider.  Avoid high-fiber, fried, greasy, or spicy foods. This information is not intended to replace advice given to you by your health care provider. Make sure you discuss any questions you have with your health care provider. Document Released: 03/22/2003 Document Revised: 12/28/2015 Document Reviewed: 12/28/2015 Elsevier Interactive Patient Education  [employer].

## 2017-06-25 NOTE — Progress Notes (Signed)
Subjective:    Patient ID: Rodney Cisneros, male    DOB: 1965-03-05, 52 y.o.   MRN: [id number]  52y/o caucasian male established patient C/o sudden onset dizziness, nausea approx 57min PTA (~1305) with associated clamminess and what he describes as difficulty focusing. He clarifies this to mean blurred vision. Also reports feeling "zoned out" as a coworker was waving hand in front of him asking if he was okay when he was standing beside her right after sx started. Pt was ambulatory without assistance to clinic. Skin cool, clammy to touch. Facial color slightly pale. Pt speaking complete, coherent sentences. Just feels unwell and requesting vital sign check with RN Hildred Alamin. After sitting at least 5 minutes in clinic, pt reports sx unchanged. No difference between standing and sitting.  Recently completed prednisone taper. Has one day remaining on Amoxicillin course. Prior to sx onset 42min ago, was feeling well and much improved from previous visit. Reports drinking at least 40oz water today. Has urinated twice today, [place] bar early this am and later cereal approximately 1130. Has not had lunch yet. CBG 96 in clinic. Stated diarrhea this am denied red or black blood.  Feeling like room spinning with position changes.  Urine "gold" colored went a couple hours ago  Denied fever/chills.  Has meclizine at home from previous vertigo episode not using.  Does not have anything for nausea or vomiting at home.  Been in a lot of dust today  + sick contacts at work viral URI/gastroenteritis     Review of Systems  Constitutional: Positive for diaphoresis and fatigue. Negative for activity change, appetite change, chills and fever.  HENT: Negative for dental problem, drooling, ear discharge, ear pain, facial swelling, hearing loss, mouth sores, nosebleeds, postnasal drip, rhinorrhea, sinus pressure, sinus pain, sneezing, sore throat, tinnitus, trouble swallowing and voice change.   Eyes: Positive for visual  disturbance. Negative for photophobia, pain, discharge, redness and itching.  Respiratory: Negative for apnea, cough, choking, chest tightness, shortness of breath, wheezing and stridor.   Cardiovascular: Negative for palpitations.  Gastrointestinal: Positive for diarrhea and nausea. Negative for abdominal distention, abdominal pain, anal bleeding, blood in stool, constipation, rectal pain and vomiting.  Endocrine: Negative for cold intolerance and heat intolerance.  Genitourinary: Negative for difficulty urinating.  Musculoskeletal: Negative for myalgias, neck pain and neck stiffness.  Skin: Negative for color change, pallor, rash and wound.  Allergic/Immunologic: Positive for environmental allergies. Negative for food allergies.  Neurological: Positive for dizziness, weakness and light-headedness. Negative for tremors, seizures, syncope, facial asymmetry, speech difficulty, numbness and headaches.  Hematological: Negative for adenopathy. Does not bruise/bleed easily.  Psychiatric/Behavioral: Negative for agitation, confusion and sleep disturbance.       Objective:   Physical Exam  Constitutional: He is oriented to person, place, and time. Vital signs are normal. He appears well-developed and well-nourished. He is active and cooperative.  Non-toxic appearance. He does not have a sickly appearance. He does not appear ill. No distress.  HENT:  Head: Normocephalic and atraumatic.  Right Ear: Hearing, external ear and ear canal normal. A middle ear effusion is present.  Left Ear: Hearing, external ear and ear canal normal. A middle ear effusion is present.  Nose: No mucosal edema, rhinorrhea, nose lacerations, sinus tenderness, nasal deformity, septal deviation or nasal septal hematoma. No epistaxis.  No foreign bodies. Right sinus exhibits no maxillary sinus tenderness and no frontal sinus tenderness. Left sinus exhibits no maxillary sinus tenderness and no frontal sinus tenderness.  Mouth/Throat: Uvula is midline and mucous membranes are normal. Mucous membranes are not pale, not dry and not cyanotic. He does not have dentures. No oral lesions. No trismus in the jaw. Normal dentition. No dental abscesses, uvula swelling, lacerations or dental caries. Posterior oropharyngeal edema and posterior oropharyngeal erythema present. No oropharyngeal exudate or tonsillar abscesses. No tonsillar exudate.  Bilateral allergic shiners; bilateral TMs air fluid level clear; cobblestoning posterior pharynx  Eyes: Pupils are equal, round, and reactive to light. Conjunctivae, EOM and lids are normal. Right eye exhibits no chemosis, no discharge, no exudate and no hordeolum. No foreign body present in the right eye. Left eye exhibits no chemosis, no discharge, no exudate and no hordeolum. No foreign body present in the left eye. Right conjunctiva is not injected. Right conjunctiva has no hemorrhage. Left conjunctiva is not injected. Left conjunctiva has no hemorrhage. No scleral icterus. Right eye exhibits normal extraocular motion and no nystagmus. Left eye exhibits normal extraocular motion and no nystagmus. Right pupil is round and reactive. Left pupil is round and reactive. Pupils are equal.  Neck: Trachea normal, normal range of motion and phonation normal. Neck supple. No tracheal tenderness, no spinous process tenderness and no muscular tenderness present. No neck rigidity. No tracheal deviation, no edema, no erythema and normal range of motion present. No thyroid mass and no thyromegaly present.  Cardiovascular: Normal rate, regular rhythm, S1 normal, S2 normal, normal heart sounds and intact distal pulses. PMI is not displaced. Exam reveals no gallop, no distant heart sounds and no friction rub.  No murmur heard. Pulmonary/Chest: Effort normal and breath sounds normal. No stridor. No respiratory distress. He has no decreased breath sounds. He has no wheezes. He has no rhonchi. He has no rales.   No cough observed in exam room; spoke full sentences without difficulty  Abdominal: Soft. Normal appearance. He exhibits no shifting dullness, no distension, no pulsatile liver, no fluid wave, no abdominal bruit, no ascites, no pulsatile midline mass and no mass. Bowel sounds are increased. There is no tenderness. There is no rigidity, no rebound, no guarding, no CVA tenderness, no tenderness at McBurney's point and negative Murphy's sign. No hernia. Hernia confirmed negative in the ventral area.  Hyperactive bowel sounds x 4 quads; dull to percussion lower bilateral fields and hyperactive LUF/RUF  Musculoskeletal: Normal range of motion. He exhibits no edema, tenderness or deformity.       Right shoulder: Normal.       Left shoulder: Normal.       Right elbow: Normal.      Left elbow: Normal.       Right hip: Normal.       Left hip: Normal.       Right knee: Normal.       Left knee: Normal.       Right ankle: Normal.       Left ankle: Normal.       Cervical back: Normal.       Thoracic back: Normal.       Lumbar back: Normal.       Right hand: Normal.       Left hand: Normal.  Lymphadenopathy:       Head (right side): No submental, no submandibular, no tonsillar, no preauricular, no posterior auricular and no occipital adenopathy present.       Head (left side): No submental, no submandibular, no tonsillar, no preauricular, no posterior auricular and no occipital adenopathy present.    He has  no cervical adenopathy.       Right cervical: No superficial cervical, no deep cervical and no posterior cervical adenopathy present.      Left cervical: No superficial cervical, no deep cervical and no posterior cervical adenopathy present.  Neurological: He is alert and oriented to person, place, and time. He has normal strength. He is not disoriented. He displays no atrophy and no tremor. No cranial nerve deficit or sensory deficit. He exhibits normal muscle tone. He displays no seizure activity.  Coordination and gait normal. GCS eye subscore is 4. GCS verbal subscore is 5. GCS motor subscore is 6.  Bilateral hand grasp equal 5/5; on/off exam table/in/out of chair without difficulty; gait sure and steady but paitent reported he feels like room spinning with position changes; movements steady not jerky or unbalanced no nystagmus  Skin: Skin is warm, dry and intact. Capillary refill takes less than 2 seconds. No abrasion, no bruising, no burn, no ecchymosis, no laceration, no lesion, no petechiae and no rash noted. He is not diaphoretic. No cyanosis or erythema. No pallor. Nails show no clubbing.  Psychiatric: He has a normal mood and affect. His speech is normal and behavior is normal. Judgment and thought content normal. He is not actively hallucinating. Cognition and memory are normal. He is attentive.  Nursing note and vitals reviewed.    Hyperactive bowel sounds dull to percussion lower and tympanny upper not TTP no heartburn; feels like room is spinning with sitting or position changes 1345 gave bottle of water sit in waiting room sip and see if dizzyness resolves.  1400 Patient drank half of 5100ml and then stated he was going to have his partner take him home so he can rest there.  No emesis or diarrhea while in clinic.  Gait sure and steady on departure from clinic back to workstation.     Assessment & Plan:  A-diarrhea, vertigo, dehydration  P-discussed diarrhea may be viral going around community/building or related to antibiotic use  Probiotics through active culture yogurt or OTC supplement he has at home.Patient given 2 UD peptobismol tabs discussed could cause black stools may use po prn after loose stool  Discussed could purchase loperamide 2mg  OTC take 1 tablet after each watery stool max up to 8 tabs per 24 hours  Electronic Rx zofran ODT 4mg  po BID prn n/v #6 RF0 electronic Rx to his pharmacy of choice  I have recommended clear fluids and the BRATT diet. Medications as directed.  Return to the clinic if symptoms persist or worsen; I have alerted the patient to call if high fever, dehydration, marked weakness, fainting, increased abdominal pain, blood in stool or vomit (red or black). Exitcare handout printed and given on dehydration, foods to relieve diarrhea, gastroenteritis given to patient. Patient verbalized agreement and understanding of treatment plan and had no further questions at this time  .Mild dehydration due to diarrhea/viral URI infection. Discussed with patient continue to sip po fluids (water, broth, gatorade, pedialyte, ginger ale, non dairy popsicles) to ensure urine pale yellow clear, mucous membranes wet/pink no white coating on tongue. Prolonged dehydration could cause acute renal insufficiency. Follow up if lethargy, unable to urinate every 8 hours, brown/cola colored urine. Exitcare handout on dehydration given to patient. Patient verbalized understanding of information/instructions, agreed with plan of care and had no further questions at this time.   P2: Hand washing and fitness   Has meclizine at home 25mg  po QID prn vertigo.  Continue singulair, claritin, flonase and  saline as previously discussed.  Discussed with patient otitis media with effusion probably causing vertigo but could also be age. Supportive treatment may take up to 4 doses meclizine per day max 100mg  per 24 hours. Discussed signs/symptoms stroke. Neighbor to drive her home recommended not driving during vertigo episodes. Follow up if aphasia, dysphasia, visual changes, weakness, fall, worst headache of life, incoordination, fever, ear discharge. Consider ENT evaluation/follow up with PCM if worsening symptoms not controlled with meclizine or needs Rx refill. Patient verbalized understanding of information/agreed with plan of care and had no further questions at this time.

## 2017-07-02 ENCOUNTER — Ambulatory Visit: Payer: Self-pay | Admitting: Registered Nurse

## 2017-07-02 ENCOUNTER — Encounter: Payer: Self-pay | Admitting: Registered Nurse

## 2017-07-02 VITALS — BP 154/94 | HR 86 | Temp 99.0°F

## 2017-07-02 DIAGNOSIS — J0101 Acute recurrent maxillary sinusitis: Secondary | ICD-10-CM

## 2017-07-02 DIAGNOSIS — J209 Acute bronchitis, unspecified: Secondary | ICD-10-CM

## 2017-07-02 MED ORDER — BENZONATATE 200 MG PO CAPS: 200.0000 mg | ORAL_CAPSULE | Freq: Three times a day (TID) | ORAL | 0 refills | Status: AC | PRN

## 2017-07-02 MED ORDER — PREDNISONE 10 MG (21) PO TBPK: ORAL_TABLET | ORAL | 0 refills | Status: DC

## 2017-07-02 MED ORDER — AMOXICILLIN-POT CLAVULANATE 875-125 MG PO TABS: 1.0000 | ORAL_TABLET | Freq: Two times a day (BID) | ORAL | 0 refills | Status: AC

## 2017-07-02 MED ORDER — ALBUTEROL SULFATE HFA 108 (90 BASE) MCG/ACT IN AERS
1.0000 | INHALATION_SPRAY | RESPIRATORY_TRACT | 0 refills | Status: DC | PRN
Start: 2017-07-02 — End: 2022-06-20

## 2017-07-02 NOTE — Patient Instructions (Signed)
How to Use a Metered Dose Inhaler A metered dose inhaler is a handheld device for taking medicine that must be breathed into the lungs (inhaled). The device can be used to deliver a variety of inhaled medicines, including:  Quick relief or rescue medicines, such as bronchodilators.  Controller medicines, such as corticosteroids.  The medicine is delivered by pushing down on a metal canister to release a preset amount of spray and medicine. Each device contains the amount of medicine that is needed for a preset number of uses (inhalations). Your health care provider may recommend that you use a spacer with your inhaler to help you take the medicine more effectively. A spacer is a plastic tube with a mouthpiece on one end and an opening that connects to the inhaler on the other end. A spacer holds the medicine in a tube for a short time, which allows you to inhale more medicine. What are the risks? If you do not use your inhaler correctly, medicine might not reach your lungs to help you breathe. Inhaler medicine can cause side effects, such as:  Mouth or throat infection.  Cough.  Hoarseness.  Headache.  Nausea and vomiting.  Lung infection (pneumonia) in people who have a lung condition called COPD.  How to use a metered dose inhaler without a spacer 1. Remove the cap from the inhaler. 2. If you are using the inhaler for the first time, shake it for 5 seconds, turn it away from your face, then release 4 puffs into the air. This is called priming. 3. Shake the inhaler for 5 seconds. 4. Position the inhaler so the top of the canister faces up. 5. Put your index finger on the top of the medicine canister. Support the bottom of the inhaler with your thumb. 6. Breathe out normally and as completely as possible, away from the inhaler. 7. Either place the inhaler between your teeth and close your lips tightly around the mouthpiece, or hold the inhaler 1-2 inches (2.5-5 cm) away from your open  mouth. Keep your tongue down out of the way. If you are unsure which technique to use, ask your health care provider. 8. Press the canister down with your index finger to release the medicine, then inhale deeply and slowly through your mouth (not your nose) until your lungs are completely filled. Inhaling should take 4-6 seconds. 9. Hold the medicine in your lungs for 5-10 seconds (10 seconds is best). This helps the medicine get into the small airways of your lungs. 10. With your lips in a tight circle (pursed), breathe out slowly. 11. Repeat steps 3-10 until you have taken the number of puffs that your health care provider directed. Wait about 1 minute between puffs or as directed. 12. Put the cap on the inhaler. 13. If you are using a steroid inhaler, rinse your mouth with water, gargle, and spit out the water. Do not swallow the water. How to use a metered dose inhaler with a spacer 1. Remove the cap from the inhaler. 2. If you are using the inhaler for the first time, shake it for 5 seconds, turn it away from your face, then release 4 puffs into the air. This is called priming. 3. Shake the inhaler for 5 seconds. 4. Place the open end of the spacer onto the inhaler mouthpiece. 5. Position the inhaler so the top of the canister faces up and the spacer mouthpiece faces you. 6. Put your index finger on the top of the medicine canister.  Support the bottom of the inhaler and the spacer with your thumb. 7. Breathe out normally and as completely as possible, away from the spacer. 8. Place the spacer between your teeth and close your lips tightly around it. Keep your tongue down out of the way. 9. Press the canister down with your index finger to release the medicine, then inhale deeply and slowly through your mouth (not your nose) until your lungs are completely filled. Inhaling should take 4-6 seconds. 10. Hold the medicine in your lungs for 5-10 seconds (10 seconds is best). This helps the medicine  get into the small airways of your lungs. 11. With your lips in a tight circle (pursed), breathe out slowly. 12. Repeat steps 3-11 until you have taken the number of puffs that your health care provider directed. Wait about 1 minute between puffs or as directed. 13. Remove the spacer from the inhaler and put the cap on the inhaler. 14. If you are using a steroid inhaler, rinse your mouth with water, gargle, and spit out the water. Do not swallow the water. Follow these instructions at home:  Take your inhaled medicine only as told by your health care provider. Do not use the inhaler more than directed by your health care provider.  Keep all follow-up visits as told by your health care provider. This is important.  If your inhaler has a counter, you can check it to determine how full your inhaler is. If your inhaler does not have a counter, ask your health care provider when you will need to refill your inhaler and write the refill date on a calendar or on your inhaler canister. Note that you cannot know when an inhaler is empty by shaking it.  Follow directions on the package insert for care and cleaning of your inhaler and spacer. Contact a health care provider if:  Symptoms are only partially relieved with your inhaler.  You are having trouble using your inhaler.  You have an increase in phlegm.  You have headaches. Get help right away if:  You feel little or no relief after using your inhaler.  You have dizziness.  You have a fast heart rate.  You have chills or a fever.  You have night sweats.  There is blood in your phlegm. Summary  A metered dose inhaler is a handheld device for taking medicine that must be breathed into the lungs (inhaled).  The medicine is delivered by pushing down on a metal canister to release a preset amount of spray and medicine.  Each device contains the amount of medicine that is needed for a preset number of uses (inhalations). This  information is not intended to replace advice given to you by your health care provider. Make sure you discuss any questions you have with your health care provider. Document Released: 12/30/2004 Document Revised: 11/20/2015 Document Reviewed: 11/20/2015 Elsevier Interactive Patient Education  2017 [employer] Acute Bronchitis, Adult Acute bronchitis is sudden (acute) swelling of the air tubes (bronchi) in the lungs. Acute bronchitis causes these tubes to fill with mucus, which can make it hard to breathe. It can also cause coughing or wheezing. In adults, acute bronchitis usually goes away within 2 weeks. A cough caused by bronchitis may last up to 3 weeks. Smoking, allergies, and asthma can make the condition worse. Repeated episodes of bronchitis may cause further lung problems, such as chronic obstructive pulmonary disease (COPD). What are the causes? This condition can be caused by germs and by substances  that irritate the lungs, including:  Cold and flu viruses. This condition is most often caused by the same virus that causes a cold.  Bacteria.  Exposure to tobacco smoke, dust, fumes, and air pollution.  What increases the risk? This condition is more likely to develop in people who:  Have close contact with someone with acute bronchitis.  Are exposed to lung irritants, such as tobacco smoke, dust, fumes, and vapors.  Have a weak immune system.  Have a respiratory condition such as asthma.  What are the signs or symptoms? Symptoms of this condition include:  A cough.  Coughing up clear, yellow, or green mucus.  Wheezing.  Chest congestion.  Shortness of breath.  A fever.  Body aches.  Chills.  A sore throat.  How is this diagnosed? This condition is usually diagnosed with a physical exam. During the exam, your health care provider may order tests, such as chest X-rays, to rule out other conditions. He or she may also:  Test a sample of your mucus for  bacterial infection.  Check the level of oxygen in your blood. This is done to check for pneumonia.  Do a chest X-ray or lung function testing to rule out pneumonia and other conditions.  Perform blood tests.  Your health care provider will also ask about your symptoms and medical history. How is this treated? Most cases of acute bronchitis clear up over time without treatment. Your health care provider may recommend:  Drinking more fluids. Drinking more makes your mucus thinner, which may make it easier to breathe.  Taking a medicine for a fever or cough.  Taking an antibiotic medicine.  Using an inhaler to help improve shortness of breath and to control a cough.  Using a cool mist vaporizer or humidifier to make it easier to breathe.  Follow these instructions at home: Medicines  Take over-the-counter and prescription medicines only as told by your health care provider.  If you were prescribed an antibiotic, take it as told by your health care provider. Do not stop taking the antibiotic even if you start to feel better. General instructions  Get plenty of rest.  Drink enough fluids to keep your urine clear or pale yellow.  Avoid smoking and secondhand smoke. Exposure to cigarette smoke or irritating chemicals will make bronchitis worse. If you smoke and you need help quitting, ask your health care provider. Quitting smoking will help your lungs heal faster.  Use an inhaler, cool mist vaporizer, or humidifier as told by your health care provider.  Keep all follow-up visits as told by your health care provider. This is important. How is this prevented? To lower your risk of getting this condition again:  Wash your hands often with soap and water. If soap and water are not available, use hand sanitizer.  Avoid contact with people who have cold symptoms.  Try not to touch your hands to your mouth, nose, or eyes.  Make sure to get the flu shot every year.  Contact a  health care provider if:  Your symptoms do not improve in 2 weeks of treatment. Get help right away if:  You cough up blood.  You have chest pain.  You have severe shortness of breath.  You become dehydrated.  You faint or keep feeling like you are going to faint.  You keep vomiting.  You have a severe headache.  Your fever or chills gets worse. This information is not intended to replace advice given to you by  your health care provider. Make sure you discuss any questions you have with your health care provider. Document Released: 02/07/2004 Document Revised: 07/25/2015 Document Reviewed: 06/20/2015 Elsevier Interactive Patient Education  2018 [employer]. Sinusitis, Adult Sinusitis is soreness and inflammation of your sinuses. Sinuses are hollow spaces in the bones around your face. Your sinuses are located:  Around your eyes.  In the middle of your forehead.  Behind your nose.  In your cheekbones.  Your sinuses and nasal passages are lined with a stringy fluid (mucus). Mucus normally drains out of your sinuses. When your nasal tissues become inflamed or swollen, the mucus can become trapped or blocked so air cannot flow through your sinuses. This allows bacteria, viruses, and funguses to grow, which leads to infection. Sinusitis can develop quickly and last for 7?10 days (acute) or for more than 12 weeks (chronic). Sinusitis often develops after a cold. What are the causes? This condition is caused by anything that creates swelling in the sinuses or stops mucus from draining, including:  Allergies.  Asthma.  Bacterial or viral infection.  Abnormally shaped bones between the nasal passages.  Nasal growths that contain mucus (nasal polyps).  Narrow sinus openings.  Pollutants, such as chemicals or irritants in the air.  A foreign object stuck in the nose.  A fungal infection. This is rare.  What increases the risk? The following factors may make you more  likely to develop this condition:  Having allergies or asthma.  Having had a recent cold or respiratory tract infection.  Having structural deformities or blockages in your nose or sinuses.  Having a weak immune system.  Doing a lot of swimming or diving.  Overusing nasal sprays.  Smoking.  What are the signs or symptoms? The main symptoms of this condition are pain and a feeling of pressure around the affected sinuses. Other symptoms include:  Upper toothache.  Earache.  Headache.  Bad breath.  Decreased sense of smell and taste.  A cough that may get worse at night.  Fatigue.  Fever.  Thick drainage from your nose. The drainage is often green and it may contain pus (purulent).  Stuffy nose or congestion.  Postnasal drip. This is when extra mucus collects in the throat or back of the nose.  Swelling and warmth over the affected sinuses.  Sore throat.  Sensitivity to light.  How is this diagnosed? This condition is diagnosed based on symptoms, a medical history, and a physical exam. To find out if your condition is acute or chronic, your health care provider may:  Look in your nose for signs of nasal polyps.  Tap over the affected sinus to check for signs of infection.  View the inside of your sinuses using an imaging device that has a light attached (endoscope).  If your health care provider suspects that you have chronic sinusitis, you may also:  Be tested for allergies.  Have a sample of mucus taken from your nose (nasal culture) and checked for bacteria.  Have a mucus sample examined to see if your sinusitis is related to an allergy.  If your sinusitis does not respond to treatment and it lasts longer than 8 weeks, you may have an MRI or CT scan to check your sinuses. These scans also help to determine how severe your infection is. In rare cases, a bone biopsy may be done to rule out more serious types of fungal sinus disease. How is this  treated? Treatment for sinusitis depends on the cause  and whether your condition is chronic or acute. If a virus is causing your sinusitis, your symptoms will go away on their own within 10 days. You may be given medicines to relieve your symptoms, including:  Topical nasal decongestants. They shrink swollen nasal passages and let mucus drain from your sinuses.  Antihistamines. These drugs block inflammation that is triggered by allergies. This can help to ease swelling in your nose and sinuses.  Topical nasal corticosteroids. These are nasal sprays that ease inflammation and swelling in your nose and sinuses.  Nasal saline washes. These rinses can help to get rid of thick mucus in your nose.  If your condition is caused by bacteria, you will be given an antibiotic medicine. If your condition is caused by a fungus, you will be given an antifungal medicine. Surgery may be needed to correct underlying conditions, such as narrow nasal passages. Surgery may also be needed to remove polyps. Follow these instructions at home: Medicines  Take, use, or apply over-the-counter and prescription medicines only as told by your health care provider. These may include nasal sprays.  If you were prescribed an antibiotic medicine, take it as told by your health care provider. Do not stop taking the antibiotic even if you start to feel better. Hydrate and Humidify  Drink enough water to keep your urine clear or pale yellow. Staying hydrated will help to thin your mucus.  Use a cool mist humidifier to keep the humidity level in your home above 50%.  Inhale steam for 10-15 minutes, 3-4 times a day or as told by your health care provider. You can do this in the bathroom while a hot shower is running.  Limit your exposure to cool or dry air. Rest  Rest as much as possible.  Sleep with your head raised (elevated).  Make sure to get enough sleep each night. General instructions  Apply a warm, moist  washcloth to your face 3-4 times a day or as told by your health care provider. This will help with discomfort.  Wash your hands often with soap and water to reduce your exposure to viruses and other germs. If soap and water are not available, use hand sanitizer.  Do not smoke. Avoid being around people who are smoking (secondhand smoke).  Keep all follow-up visits as told by your health care provider. This is important. Contact a health care provider if:  You have a fever.  Your symptoms get worse.  Your symptoms do not improve within 10 days. Get help right away if:  You have a severe headache.  You have persistent vomiting.  You have pain or swelling around your face or eyes.  You have vision problems.  You develop confusion.  Your neck is stiff.  You have trouble breathing. This information is not intended to replace advice given to you by your health care provider. Make sure you discuss any questions you have with your health care provider. Document Released: 12/30/2004 Document Revised: 08/26/2015 Document Reviewed: 10/25/2014 Elsevier Interactive Patient Education  2018 [employer]. Sinus Rinse What is a sinus rinse? A sinus rinse is a simple home treatment that is used to rinse your sinuses with a sterile mixture of salt and water (saline solution). Sinuses are air-filled spaces in your skull behind the bones of your face and forehead that open into your nasal cavity. You will use the following:  Saline solution.  Neti pot or spray bottle. This releases the saline solution into your nose and through your  sinuses. Neti pots and spray bottles can be purchased at [employer], a health food store, or online.  When would I do a sinus rinse? A sinus rinse can help to clear mucus, dirt, dust, or pollen from the nasal cavity. You may do a sinus rinse when you have a cold, a virus, nasal allergy symptoms, a sinus infection, or stuffiness in the nose or sinuses. If  you are considering a sinus rinse:  Ask your child's health care provider before performing a sinus rinse on your child.  Do not do a sinus rinse if you have had ear or nasal surgery, ear infection, or blocked ears.  How do I do a sinus rinse?  Wash your hands.  Disinfect your device according to the directions provided and then dry it.  Use the solution that comes with your device or one that is sold separately in stores. Follow the mixing directions on the package.  Fill your device with the amount of saline solution as directed by the device instructions.  Stand over a sink and tilt your head sideways over the sink.  Place the spout of the device in your upper nostril (the one closer to the ceiling).  Gently pour or squeeze the saline solution into the nasal cavity. The liquid should drain to the lower nostril if you are not overly congested.  Gently blow your nose. Blowing too hard may cause ear pain.  Repeat in the other nostril.  Clean and rinse your device with clean water and then air-dry it. Are there risks of a sinus rinse? Sinus rinse is generally very safe and effective. However, there are a few risks, which include:  A burning sensation in the sinuses. This may happen if you do not make the saline solution as directed. Make sure to follow all directions when making the saline solution.  Infection from contaminated water. This is rare, but possible.  Nasal irritation.  This information is not intended to replace advice given to you by your health care provider. Make sure you discuss any questions you have with your health care provider. Document Released: 07/27/2013 Document Revised: 11/27/2015 Document Reviewed: 05/17/2013 Elsevier Interactive Patient Education  2017 [employer].

## 2017-07-02 NOTE — Progress Notes (Signed)
Subjective:    Patient ID: Rodney Cisneros, male    DOB: 11-Sep-1965, 52 y.o.   MRN: [id number]  52 y/o Caucasian established male pt c/o continued productive cough, sore throat, maxillary sinus pain/pressure, nasal congestion, rhinorrhea, bilateral otalgia/congestion, R>L. Sts phlegm he coughed up this morning was streaked with blood. Still using Flonase, saline spray, phenylephrine. On 6/6 started Prednisone and Amoxicillin courses. Sx did improve completely after completing courses but this past weekend, approx 4-5 days ago, sx returned. Chest wall pain with cough. Called [facility] on 6/18, was told they thought it was a sinus inf. Had amoxicillin called in but did not have resources to pick up med at that time so it has not been started.   + sick contacts at work with URI symptoms  He hasn't been able to rest much since getting sick again; did miss 3 days of work     Review of Systems  Constitutional: Positive for chills, diaphoresis and fatigue. Negative for activity change, appetite change, fever and unexpected weight change.  HENT: Positive for congestion, ear pain, postnasal drip, rhinorrhea, sinus pressure, sinus pain and sore throat. Negative for dental problem, drooling, ear discharge, facial swelling, hearing loss, mouth sores, nosebleeds, sneezing, tinnitus, trouble swallowing and voice change.   Eyes: Negative for photophobia, pain, discharge, redness, itching and visual disturbance.  Respiratory: Positive for cough. Negative for choking, chest tightness, shortness of breath, wheezing and stridor.   Cardiovascular: Negative for chest pain, palpitations and leg swelling.  Gastrointestinal: Negative for abdominal distention, abdominal pain, blood in stool, constipation, diarrhea, nausea and vomiting.  Endocrine: Negative for cold intolerance and heat intolerance.  Genitourinary: Negative for dysuria.  Musculoskeletal: Negative for arthralgias, back pain, gait problem, joint swelling,  myalgias, neck pain and neck stiffness.  Skin: Negative for color change, pallor, rash and wound.  Allergic/Immunologic: Positive for environmental allergies. Negative for food allergies and immunocompromised state.  Neurological: Positive for headaches. Negative for dizziness, tremors, seizures, syncope, facial asymmetry, speech difficulty, weakness, light-headedness and numbness.  Hematological: Negative for adenopathy. Does not bruise/bleed easily.  Psychiatric/Behavioral: Negative for agitation, behavioral problems, confusion and sleep disturbance.       Objective:   Physical Exam  Constitutional: He is oriented to person, place, and time. Vital signs are normal. He appears well-developed and well-nourished. He is active and cooperative.  Non-toxic appearance. He does not have a sickly appearance. He appears ill. No distress.  HENT:  Head: Normocephalic and atraumatic.  Right Ear: Hearing, external ear and ear canal normal. A middle ear effusion is present.  Left Ear: Hearing, external ear and ear canal normal. A middle ear effusion is present.  Nose: Mucosal edema and rhinorrhea present. No nose lacerations, sinus tenderness, nasal deformity, septal deviation or nasal septal hematoma. No epistaxis.  No foreign bodies. Right sinus exhibits maxillary sinus tenderness and frontal sinus tenderness. Left sinus exhibits maxillary sinus tenderness and frontal sinus tenderness.  Mouth/Throat: Uvula is midline and mucous membranes are normal. Mucous membranes are not pale, not dry and not cyanotic. He does not have dentures. No oral lesions. No trismus in the jaw. Normal dentition. No dental abscesses, uvula swelling, lacerations or dental caries. Posterior oropharyngeal edema and posterior oropharyngeal erythema present. No oropharyngeal exudate or tonsillar abscesses. No tonsillar exudate.  Eyes: Pupils are equal, round, and reactive to light. Conjunctivae, EOM and lids are normal. Right eye exhibits  no chemosis, no discharge, no exudate and no hordeolum. No foreign body present in the right eye.  Left eye exhibits no chemosis, no discharge, no exudate and no hordeolum. No foreign body present in the left eye. Right conjunctiva is not injected. Right conjunctiva has no hemorrhage. Left conjunctiva is not injected. Left conjunctiva has no hemorrhage. No scleral icterus. Right eye exhibits normal extraocular motion and no nystagmus. Left eye exhibits normal extraocular motion and no nystagmus. Right pupil is round and reactive. Left pupil is round and reactive. Pupils are equal.  Neck: Trachea normal, normal range of motion and phonation normal. Neck supple. No tracheal tenderness, no spinous process tenderness and no muscular tenderness present. No neck rigidity. No tracheal deviation, no edema, no erythema and normal range of motion present. No thyroid mass and no thyromegaly present.  Cardiovascular: Normal rate, regular rhythm, S1 normal, S2 normal, normal heart sounds and intact distal pulses. PMI is not displaced. Exam reveals no gallop and no friction rub.  No murmur heard. Pulmonary/Chest: Effort normal and breath sounds normal. No stridor. No respiratory distress. He has no decreased breath sounds. He has no wheezes. He has no rhonchi. He has no rales.  Abdominal: Soft. Normal appearance. He exhibits no distension, no fluid wave and no ascites. There is no rigidity and no guarding.  Musculoskeletal: Normal range of motion. He exhibits no edema or tenderness.       Right shoulder: Normal.       Left shoulder: Normal.       Right elbow: Normal.      Left elbow: Normal.       Right hip: Normal.       Left hip: Normal.       Right knee: Normal.       Left knee: Normal.       Cervical back: Normal.       Thoracic back: Normal.       Lumbar back: Normal.       Right hand: Normal.       Left hand: Normal.  Lymphadenopathy:       Head (right side): No submental, no submandibular, no tonsillar,  no preauricular, no posterior auricular and no occipital adenopathy present.       Head (left side): No submental, no submandibular, no tonsillar, no preauricular, no posterior auricular and no occipital adenopathy present.    He has no cervical adenopathy.       Right cervical: No superficial cervical, no deep cervical and no posterior cervical adenopathy present.      Left cervical: No superficial cervical, no deep cervical and no posterior cervical adenopathy present.  Neurological: He is alert and oriented to person, place, and time. He has normal strength. He is not disoriented. He displays no atrophy and no tremor. No cranial nerve deficit or sensory deficit. He exhibits normal muscle tone. He displays no seizure activity. Coordination and gait normal. GCS eye subscore is 4. GCS verbal subscore is 5. GCS motor subscore is 6.  On/off exam table; in/out of chair without difficulty; gait sure and steady in hallway  Skin: Skin is warm and intact. Capillary refill takes less than 2 seconds. No abrasion, no bruising, no burn, no ecchymosis, no laceration, no lesion, no petechiae and no rash noted. He is not diaphoretic. No cyanosis or erythema. No pallor. Nails show no clubbing.  Skin face and neck moist to touch no droplets beading up  Psychiatric: He has a normal mood and affect. His speech is normal and behavior is normal. Judgment and thought content normal. He is not actively hallucinating.  Cognition and memory are normal. He is attentive.  Nursing note and vitals reviewed.    Frequent nonproductive cough in exam room; nasal clearing; bilateral allergic shiners; clear discharge bilateral nares. Cobblestoning posterior pharynx; bilateral nasal turbinates edema erythema; bilateral TMs air fluid level clear; oro pharynx macular erythema; facial and neck skin clammy     Assessment & Plan:  A-recurrent acute maxillary sinusitis and bronchitis  P-Discussed hydrating and going home to rest today  probably helpful to recovering faster.  flonase 1 spray each nostril BID, saline 2 sprays each nostril q2h wa prn congestion. start augmentin 875mg  po BID x 10 days #20 RF0 dispensed from [facility].  Denied personal or family history of ENT cancer.  Shower BID especially prior to bed. No evidence of systemic bacterial infection, non toxic and well hydrated.  I do not see where any further testing or imaging is necessary at this time.   I will suggest supportive care, rest, good hygiene and encourage the patient to take adequate fluids.  The patient is to return to clinic or EMERGENCY ROOM if symptoms worsen or change significantly.  Exitcare handout on sinusitis and sinus rinse.  Patient verbalized agreement and understanding of treatment plan and had no further questions at this time.   P2:  Hand washing and cover cough   Cough lozenges po q2h prn cough.  Electronic Rx tessalon pearles 200mg  po TID prn cough #30 RF0 to his pharmacy of choice.  Prednisone taper 10mg  (60/50/40/30/20/10mg ) po daily with breakfast #21 RF0 dispensed from [facility].  Discussed possible side effects increased/decreased appetite, difficulty sleeping, increased blood sugar, increased blood pressure and heart rate.  Albuterol MDI 79mcg 1-2 puffs po q4-6h prn protracted cough/wheeze #1 RF0 side effect increased heart rate. Bronchitis simple, community acquired, may have started as viral (probably respiratory syncytial, parainfluenza, influenza, or adenovirus), but now evidence of acute purulent bronchitis with resultant bronchial edema and mucus formation.  Viruses are the most common cause of bronchial inflammation in otherwise healthy adults with acute bronchitis.  The appearance of sputum is not predictive of whether a bacterial infection is present.  Purulent sputum is most often caused by viral infections.  There are a small portion of those caused by non-viral agents being Mycoplama pneumonia.  Microscopic examination or C&S of sputum in the  healthy adult with acute bronchitis is generally not helpful (usually negative or normal respiratory flora) other considerations being cough from upper respiratory tract infections, sinusitis or allergic syndromes (mild asthma or viral pneumonia).  Differential Diagnoses:  reactive airway disease (asthma, allergic aspergillosis (eosinophilia), chronic bronchitis, respiratory infection (sinusitis, common cold, pneumonia), congestive heart failure, reflux esophagitis, bronchogenic tumor, aspiration syndromes and/or exposure to pulmonary irritants/smoke.    Without high fever, severe dyspnea, lack of physical findings or other risk factors, I will hold on a chest radiograph and CBC at this time.  I discussed that approximately 50% of patients with acute bronchitis have a cough that lasts up to three weeks, and 25% for over a month.  Tylenol 500mg  one to two tablets every four to six hours as needed for fever or myalgias.  No aspirin. Exitcare handout on bronchitis and inhaler use.  ER if hemopthysis, SOB, worst chest pain of life.   Patient instructed to follow up in one week or sooner if symptoms worsen.  Patient verbalized agreement and understanding of treatment plan.  P2:  hand washing and cover cough

## 2017-08-11 ENCOUNTER — Other Ambulatory Visit: Payer: Self-pay | Admitting: Gastroenterology

## 2017-08-11 DIAGNOSIS — R197 Diarrhea, unspecified: Secondary | ICD-10-CM

## 2017-08-11 DIAGNOSIS — R110 Nausea: Secondary | ICD-10-CM

## 2017-08-11 DIAGNOSIS — R1011 Right upper quadrant pain: Secondary | ICD-10-CM

## 2017-08-31 ENCOUNTER — Ambulatory Visit
Admission: RE | Admit: 2017-08-31 | Discharge: 2017-08-31 | Disposition: A | Discharge status: Home / Self Care | Payer: PRIVATE HEALTH INSURANCE | Source: Ambulatory Visit | Attending: Gastroenterology | Admitting: Gastroenterology

## 2017-08-31 DIAGNOSIS — K760 Fatty (change of) liver, not elsewhere classified: Secondary | ICD-10-CM | POA: Diagnosis not present

## 2017-08-31 DIAGNOSIS — R110 Nausea: Secondary | ICD-10-CM | POA: Diagnosis present

## 2017-08-31 DIAGNOSIS — R197 Diarrhea, unspecified: Secondary | ICD-10-CM | POA: Insufficient documentation

## 2017-08-31 DIAGNOSIS — R1011 Right upper quadrant pain: Secondary | ICD-10-CM | POA: Diagnosis present

## 2017-08-31 MED ORDER — TECHNETIUM TC 99M MEBROFENIN IV KIT
5.3300 | PACK | Freq: Once | INTRAVENOUS | Status: AC | PRN
  Administered 2017-08-31: 5.33 via INTRAVENOUS

## 2017-09-15 ENCOUNTER — Ambulatory Visit: Payer: Self-pay | Admitting: General Surgery

## 2017-09-15 NOTE — H&P (View-Only) (Signed)
PATIENT PROFILE: Rodney Cisneros is a 52 y.o. male who presents to the Clinic for consultation at the request of  Ellin Mayhew, [place] and Dr. 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  PCP:  Yevonne Pax, MD  HISTORY OF PRESENT ILLNESS: Rodney Cisneros reports having abdominal pain since more than a year ago. The patient refers that pain can be on the epigastric area and radiates to the right flank and right back. Other time pain can be on the left side. Patient refer pain may be aggravated with oral intake or may be spontaneous. Patient also complain of bloating, diarrhea, constipation, gas pain. Pain was improved initially with PPI but is not getting better with that anymore. Pain also may be exacerbated with positions.  Patient has been evaluated by GI in [facility] and abdominal ultrasound, CT scan, Endoscopy, Colonoscopy, treated for IBS, treated for gastritis. Patient also evaluated by GI service at [facility] with Ellin Mayhew, [place] who ordered new ultrasound and HIDA scan. Both studies were essentially normal but the it was noted that the patient had right upper quadrant pain with Ensure intake. Patient denies episodes of fever or chills. On that evaluation patient was informed by Gastroenterologist that gallbladder removal is recommended.    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 Positive for fatigue Allergy and Immunology ROS: negative for - hives  Hematological and Lymphatic ROS: negative for - bleeding problems or bruising, negative for palpable nodes Endocrine ROS: negative for - heat or cold intolerance, hair changes Respiratory ROS:  negative for - cough, shortness of breath or wheezing Cardiovascular ROS: no chest pain or palpitations GI ROS: positive for nausea, vomiting, abdominal pain, diarrhea, constipation Musculoskeletal ROS: positive for - joint swelling or muscle pain Neurological ROS: negative for - confusion, syncope. Positive for headache and dizziness.  Dermatological ROS: negative for pruritus and rash Psychiatric: positive for anxiety, depression, difficulty sleeping. Negative for memory loss  MEDICATIONS: CurrentMedications        Current Outpatient Medications  Medication Sig Dispense Refill  . dicyclomine (BENTYL) 10 mg capsule Take 1 capsule (10 mg total) by mouth 4 (four) times daily as needed (abd pain diarrhea) 60 capsule 3  . DULoxetine (CYMBALTA) 60 MG DR capsule TAKE 1 CAPSULE(60 MG) BY MOUTH EVERY DAY 90 capsule 0  . fluticasone (FLONASE) 50 mcg/actuation nasal spray Place 1 spray into both nostrils 2 (two) times daily. 16 g 0  . loratadine (CLARITIN) 10 mg tablet Take 10 mg by mouth once daily.    Marland Kitchen omeprazole 20 mg Take 20 mg by mouth 2 (two) times daily Take 30 min before meals. 60 each 11   No current facility-administered medications for this visit.       ALLERGIES: Sulfa (sulfonamide antibiotics)  PAST MEDICAL HISTORY:     Past Medical History:  Diagnosis Date  . Allergic rhinitis   . Anxiety   . Colitis 02/20/2014   active  . Depression   . Diverticulosis   . History of epididymitis    and prostatitis  . Tubular adenoma of colon,  unspecified 09/26/2015    PAST SURGICAL HISTORY:      Past Surgical History:  Procedure Laterality Date  . COLONOSCOPY  02/20/2014   Active colitis/ Negative polyp/repeat 75yr/PYO  . COLONOSCOPY  09/26/2015   Tubular adenoma of colon/Diverticulosis/Repeat 30yrs/MUS  . EGD  10/13/2006   No repeat per RTE  . FUNCTIONAL ENDOSCOPIC SINUS SURGERY    . LAMINECTOMY LUMBAR SPINE  1991     FAMILY HISTORY:      Family  History  Problem Relation Age of Onset  . Stroke Mother   . Breast cancer Mother   . Allergic rhinitis Mother   . Myocardial Infarction (Heart attack) Father   . High blood pressure (Hypertension) Father   . Allergic rhinitis Father   . Myocardial Infarction (Heart attack) Brother      SOCIAL HISTORY: Social History          Socioeconomic History  . Marital status: Single    Spouse name: Not on file  . Number of children: Not on file  . Years of education: Not on file  . Highest education level: Not on file  Occupational History  . Not on file  Social Needs  . Financial resource strain: Not on file  . Food insecurity:    Worry: Not on file    Inability: Not on file  . Transportation needs:    Medical: Not on file    Non-medical: Not on file  Tobacco Use  . Smoking status: Former Research scientist (life sciences)  . Smokeless tobacco: Never Used  Substance and Sexual Activity  . Alcohol use: Yes    Alcohol/week: 0.0 oz    Comment: occasional  . Drug use: No  . Sexual activity: Not on file  Other Topics Concern  . Not on file  Social History Narrative  . Not on file      PHYSICAL EXAM:    Vitals:   09/15/17 0815  BP: (!) 147/93  Pulse: 89  Temp: 36.1 C (97 F)   Body mass index is 33.63 kg/m. Weight: (!) 112.5 kg (248 lb)   GENERAL: Alert, active, oriented x3  HEENT: Pupils equal reactive to light. Extraocular movements are intact. Sclera clear. Palpebral conjunctiva normal red color.Pharynx clear.  NECK: Supple with no palpable mass and no adenopathy.  LUNGS: Sound clear with no rales rhonchi or wheezes.  HEART: Regular rhythm S1 and S2 without murmur.  ABDOMEN: Soft and depressible, nontender with no palpable mass, no hepatomegaly.   EXTREMITIES: Well-developed well-nourished symmetrical with no dependent edema.  NEUROLOGICAL: Awake alert oriented, facial expression symmetrical, moving all extremities.  REVIEW OF DATA: I have  reviewed the following data today:      Initial consult on 09/15/2017  Component Date Value  . WBC (White Blood Cell Co* 09/15/2017 6.1   . RBC (Red Blood Cell Coun* 09/15/2017 4.58*  . Hemoglobin 09/15/2017 14.4   . Hematocrit 09/15/2017 42.7   . MCV (Mean Corpuscular Vo* 09/15/2017 93.2   . MCH (Mean Corpuscular He* 09/15/2017 31.4*  . MCHC (Mean Corpuscular H* 09/15/2017 33.7   . Platelet Count 09/15/2017 316   . RDW-CV (Red Cell Distrib* 09/15/2017 12.5   . MPV (Mean Platelet Volum* 09/15/2017 9.4   . Neutrophils 09/15/2017 3.11   . Lymphocytes 09/15/2017 1.93   . Monocytes 09/15/2017 0.73   . Eosinophils 09/15/2017 0.27   . Basophils 09/15/2017 0.04   . Neutrophil % 09/15/2017 50.9   . Lymphocyte % 09/15/2017 31.6   . Monocyte % 09/15/2017  11.9   . Eosinophil % 09/15/2017 4.4   . Basophil% 09/15/2017 0.7   . Immature Granulocyte % 09/15/2017 0.5   . Immature Granulocyte Cou* 09/15/2017 0.03      ASSESSMENT: Rodney Cisneros is a 52 y.o. male presenting for consultation for biliary colic.    Patient was oriented about the diagnosis of suspected biliary dysfunction. Also oriented about what is the gallbladder, its anatomy and function. The patient was oriented about the treatment alternatives (observation vs cholecystectomy). Patient was oriented about the possibility that will continue to have similar pain symptoms even after the gallbladder is removed. Surgical technique (open vs laparoscopic) was discussed. It was also discussed the goals of the surgery (decrease the pain episodes and avoid the risk of cholecystitis) and the risk of surgery including: bleeding, infection, common bile duct injury, stone retention, injury to other organs such as bowel, liver, stomach, other complications such as hernia, bowel obstruction among others. Also discussed with patient about anesthesia and its complications such as: reaction to medications, pneumonia, heart complications, death, among  others.  This patient has no stone seen on abdominal ultrasound and the HIDA scan gallbladder ejection fraction is "normal". The patient was oriented multiple times that there is no guarantee that the pain will improve after cholecystectomy because his symptoms are not specific of gallbladder disease. I discussed with patient that the right upper quadrant pain that radiates to the back after eating may improve but other symptoms as bloating, left abdominal pain, the diarrhea may not change. In multiple times, this discussion was taken, that there is no guarantee of symptoms improvement. Case discussed with Dr. Gustavo Lah that if pain does not improve, patient will go back to GI for further management.   PLAN: 1. Laparoscopic cholecystectomy (63785) 2. CBC, CMP 3. Work not that he was in the appointment today and can return to work tomorrow without restriction 4. Contact us if has any question or concern  Patient verbalized understanding, all questions were answered, and were agreeable with the plan outlined above.    Herbert Pun, MD

## 2017-09-15 NOTE — H&P (Signed)
PATIENT PROFILE: Rodney Cisneros is a 52 y.o. male who presents to the Clinic for consultation at the request of  Ellin Mayhew, [place] and Dr. 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  PCP:  Yevonne Pax, MD  HISTORY OF PRESENT ILLNESS: Rodney Cisneros reports having abdominal pain since more than a year ago. The patient refers that pain can be on the epigastric area and radiates to the right flank and right back. Other time pain can be on the left side. Patient refer pain may be aggravated with oral intake or may be spontaneous. Patient also complain of bloating, diarrhea, constipation, gas pain. Pain was improved initially with PPI but is not getting better with that anymore. Pain also may be exacerbated with positions.  Patient has been evaluated by GI in [facility] and abdominal ultrasound, CT scan, Endoscopy, Colonoscopy, treated for IBS, treated for gastritis. Patient also evaluated by GI service at [facility] with Ellin Mayhew, [place] who ordered new ultrasound and HIDA scan. Both studies were essentially normal but the it was noted that the patient had right upper quadrant pain with Ensure intake. Patient denies episodes of fever or chills. On that evaluation patient was informed by Gastroenterologist that gallbladder removal is recommended.    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 Positive for fatigue Allergy and Immunology ROS: negative for - hives  Hematological and Lymphatic ROS: negative for - bleeding problems or bruising, negative for palpable nodes Endocrine ROS: negative for - heat or cold intolerance, hair changes Respiratory ROS:  negative for - cough, shortness of breath or wheezing Cardiovascular ROS: no chest pain or palpitations GI ROS: positive for nausea, vomiting, abdominal pain, diarrhea, constipation Musculoskeletal ROS: positive for - joint swelling or muscle pain Neurological ROS: negative for - confusion, syncope. Positive for headache and dizziness.  Dermatological ROS: negative for pruritus and rash Psychiatric: positive for anxiety, depression, difficulty sleeping. Negative for memory loss  MEDICATIONS: CurrentMedications        Current Outpatient Medications  Medication Sig Dispense Refill  . dicyclomine (BENTYL) 10 mg capsule Take 1 capsule (10 mg total) by mouth 4 (four) times daily as needed (abd pain diarrhea) 60 capsule 3  . DULoxetine (CYMBALTA) 60 MG DR capsule TAKE 1 CAPSULE(60 MG) BY MOUTH EVERY DAY 90 capsule 0  . fluticasone (FLONASE) 50 mcg/actuation nasal spray Place 1 spray into both nostrils 2 (two) times daily. 16 g 0  . loratadine (CLARITIN) 10 mg tablet Take 10 mg by mouth once daily.    Marland Kitchen omeprazole 20 mg Take 20 mg by mouth 2 (two) times daily Take 30 min before meals. 60 each 11   No current facility-administered medications for this visit.       ALLERGIES: Sulfa (sulfonamide antibiotics)  PAST MEDICAL HISTORY:     Past Medical History:  Diagnosis Date  . Allergic rhinitis   . Anxiety   . Colitis 02/20/2014   active  . Depression   . Diverticulosis   . History of epididymitis    and prostatitis  . Tubular adenoma of colon,  unspecified 09/26/2015    PAST SURGICAL HISTORY:      Past Surgical History:  Procedure Laterality Date  . COLONOSCOPY  02/20/2014   Active colitis/ Negative polyp/repeat 75yr/PYO  . COLONOSCOPY  09/26/2015   Tubular adenoma of colon/Diverticulosis/Repeat 78yrs/MUS  . EGD  10/13/2006   No repeat per RTE  . FUNCTIONAL ENDOSCOPIC SINUS SURGERY    . LAMINECTOMY LUMBAR SPINE  1991     FAMILY HISTORY:      Family  History  Problem Relation Age of Onset  . Stroke Mother   . Breast cancer Mother   . Allergic rhinitis Mother   . Myocardial Infarction (Heart attack) Father   . High blood pressure (Hypertension) Father   . Allergic rhinitis Father   . Myocardial Infarction (Heart attack) Brother      SOCIAL HISTORY: Social History          Socioeconomic History  . Marital status: Single    Spouse name: Not on file  . Number of children: Not on file  . Years of education: Not on file  . Highest education level: Not on file  Occupational History  . Not on file  Social Needs  . Financial resource strain: Not on file  . Food insecurity:    Worry: Not on file    Inability: Not on file  . Transportation needs:    Medical: Not on file    Non-medical: Not on file  Tobacco Use  . Smoking status: Former Research scientist (life sciences)  . Smokeless tobacco: Never Used  Substance and Sexual Activity  . Alcohol use: Yes    Alcohol/week: 0.0 oz    Comment: occasional  . Drug use: No  . Sexual activity: Not on file  Other Topics Concern  . Not on file  Social History Narrative  . Not on file      PHYSICAL EXAM:    Vitals:   09/15/17 0815  BP: (!) 147/93  Pulse: 89  Temp: 36.1 C (97 F)   Body mass index is 33.63 kg/m. Weight: (!) 112.5 kg (248 lb)   GENERAL: Alert, active, oriented x3  HEENT: Pupils equal reactive to light. Extraocular movements are intact. Sclera clear. Palpebral conjunctiva normal red color.Pharynx clear.  NECK: Supple with no palpable mass and no adenopathy.  LUNGS: Sound clear with no rales rhonchi or wheezes.  HEART: Regular rhythm S1 and S2 without murmur.  ABDOMEN: Soft and depressible, nontender with no palpable mass, no hepatomegaly.   EXTREMITIES: Well-developed well-nourished symmetrical with no dependent edema.  NEUROLOGICAL: Awake alert oriented, facial expression symmetrical, moving all extremities.  REVIEW OF DATA: I have  reviewed the following data today:      Initial consult on 09/15/2017  Component Date Value  . WBC (White Blood Cell Co* 09/15/2017 6.1   . RBC (Red Blood Cell Coun* 09/15/2017 4.58*  . Hemoglobin 09/15/2017 14.4   . Hematocrit 09/15/2017 42.7   . MCV (Mean Corpuscular Vo* 09/15/2017 93.2   . MCH (Mean Corpuscular He* 09/15/2017 31.4*  . MCHC (Mean Corpuscular H* 09/15/2017 33.7   . Platelet Count 09/15/2017 316   . RDW-CV (Red Cell Distrib* 09/15/2017 12.5   . MPV (Mean Platelet Volum* 09/15/2017 9.4   . Neutrophils 09/15/2017 3.11   . Lymphocytes 09/15/2017 1.93   . Monocytes 09/15/2017 0.73   . Eosinophils 09/15/2017 0.27   . Basophils 09/15/2017 0.04   . Neutrophil % 09/15/2017 50.9   . Lymphocyte % 09/15/2017 31.6   . Monocyte % 09/15/2017  11.9   . Eosinophil % 09/15/2017 4.4   . Basophil% 09/15/2017 0.7   . Immature Granulocyte % 09/15/2017 0.5   . Immature Granulocyte Cou* 09/15/2017 0.03      ASSESSMENT: Rodney Cisneros is a 52 y.o. male presenting for consultation for biliary colic.    Patient was oriented about the diagnosis of suspected biliary dysfunction. Also oriented about what is the gallbladder, its anatomy and function. The patient was oriented about the treatment alternatives (observation vs cholecystectomy). Patient was oriented about the possibility that will continue to have similar pain symptoms even after the gallbladder is removed. Surgical technique (open vs laparoscopic) was discussed. It was also discussed the goals of the surgery (decrease the pain episodes and avoid the risk of cholecystitis) and the risk of surgery including: bleeding, infection, common bile duct injury, stone retention, injury to other organs such as bowel, liver, stomach, other complications such as hernia, bowel obstruction among others. Also discussed with patient about anesthesia and its complications such as: reaction to medications, pneumonia, heart complications, death, among  others.  This patient has no stone seen on abdominal ultrasound and the HIDA scan gallbladder ejection fraction is "normal". The patient was oriented multiple times that there is no guarantee that the pain will improve after cholecystectomy because his symptoms are not specific of gallbladder disease. I discussed with patient that the right upper quadrant pain that radiates to the back after eating may improve but other symptoms as bloating, left abdominal pain, the diarrhea may not change. In multiple times, this discussion was taken, that there is no guarantee of symptoms improvement. Case discussed with Dr. Gustavo Lah that if pain does not improve, patient will go back to GI for further management.   PLAN: 1. Laparoscopic cholecystectomy (38453) 2. CBC, CMP 3. Work not that he was in the appointment today and can return to work tomorrow without restriction 4. Contact us if has any question or concern  Patient verbalized understanding, all questions were answered, and were agreeable with the plan outlined above.    Herbert Pun, MD

## 2017-09-17 ENCOUNTER — Other Ambulatory Visit: Payer: Self-pay

## 2017-09-17 ENCOUNTER — Encounter
Admission: RE | Admit: 2017-09-17 | Discharge: 2017-09-17 | Disposition: A | Discharge status: Home / Self Care | Payer: PRIVATE HEALTH INSURANCE | Source: Ambulatory Visit | Attending: General Surgery | Admitting: General Surgery

## 2017-09-17 DIAGNOSIS — Z01810 Encounter for preprocedural cardiovascular examination: Secondary | ICD-10-CM | POA: Insufficient documentation

## 2017-09-17 DIAGNOSIS — I10 Essential (primary) hypertension: Secondary | ICD-10-CM | POA: Diagnosis not present

## 2017-09-17 HISTORY — DX: Diverticulosis of intestine, part unspecified, without perforation or abscess without bleeding: K57.90

## 2017-09-17 HISTORY — DX: Benign neoplasm, unspecified site: D36.9

## 2017-09-17 HISTORY — DX: Gastro-esophageal reflux disease without esophagitis: K21.9

## 2017-09-17 HISTORY — DX: Depression, unspecified: F32.A

## 2017-09-17 HISTORY — DX: Noninfective gastroenteritis and colitis, unspecified: K52.9

## 2017-09-17 HISTORY — DX: Allergic rhinitis, unspecified: J30.9

## 2017-09-17 HISTORY — DX: Ulcerative (chronic) rectosigmoiditis with rectal bleeding: K51.311

## 2017-09-17 HISTORY — DX: Inflammatory disease of prostate, unspecified: N41.9

## 2017-09-17 HISTORY — DX: Major depressive disorder, single episode, unspecified: F32.9

## 2017-09-17 HISTORY — DX: Anxiety disorder, unspecified: F41.9

## 2017-09-17 HISTORY — DX: Epididymitis: N45.1

## 2017-09-17 NOTE — Patient Instructions (Signed)
Your procedure is scheduled on: September 23, 2017 Wednesday Report to Day Surgery on the 2nd floor of the [facility]. To find out your arrival time, please call [phone] between 1PM - 3PM on: September 22, 2017 TUESDAY  REMEMBER: Instructions that are not followed completely may result in serious medical risk, up to and including death; or upon the discretion of your surgeon and anesthesiologist your surgery may need to be rescheduled.  Do not eat food after midnight the night before surgery.  No gum chewing, lozengers or hard candies.  You may however, drink CLEAR liquids up to 2 hours before you are scheduled to arrive for your surgery. Do not drink anything within 2 hours of the start of your surgery.  Clear liquids include: - water  - apple juice without pulp -CLEAR gatorade - black coffee or tea (Do NOT add milk or creamers to the coffee or tea) Do NOT drink anything that is not on this list.  Type 1 and Type 2 diabetics should only drink water.  No Alcohol for 24 hours before or after surgery.  No Smoking including e-cigarettes for 24 hours prior to surgery.  No chewable tobacco products for at least 6 hours prior to surgery.  No nicotine patches on the day of surgery.  On the morning of surgery brush your teeth with toothpaste and water, you may rinse your mouth with mouthwash if you wish. Do not swallow any toothpaste or mouthwash.  Notify your doctor if there is any change in your medical condition (cold, fever, infection).  Do not wear jewelry, make-up, hairpins, clips or nail polish.  Do not wear lotions, powders, or perfumes. You may NOTwear deodorant.  Do not shave 48 hours prior to surgery. Men may shave face and neck.  Contacts and dentures may not be worn into surgery.  Do not bring valuables to the hospital, including drivers license, insurance or credit cards.  [facility] is not responsible for any belongings or valuables.   TAKE THESE  MEDICATIONS THE MORNING OF SURGERY: CYMBALTA LORATADINE METOPROLOL OMEPRAZOLE   Use CHG Soap as directed on instruction sheet.  Use inhalers on the day of surgery AND FLONASE   Stop Anti-inflammatories (NSAIDS) such as Advil, Aleve, Ibuprofen, Motrin, Naproxen, Naprosyn and Aspirin based products such as Excedrin, Goodys Powder, BC Powder. (May take Tylenol or Acetaminophen if needed.)  Stop ANY OVER THE COUNTER supplements until after surgery. (May continue Vitamin D, Vitamin B, and multivitamin.)  Wear comfortable clothing (specific to your surgery type) to the hospital.  Plan for stool softeners for home use.  If you are being admitted to the hospital overnight, leave your suitcase in the car. After surgery it may be brought to your room.  If you are being discharged the day of surgery, you will not be allowed to drive home. You will need a responsible adult to drive you home and stay with you that night.   If you are taking public transportation, you will need to have a responsible adult with you. Please confirm with your physician that it is acceptable to use public transportation.   Please call [phone] if you have any questions about these instructions.

## 2017-09-22 MED ORDER — CEFAZOLIN SODIUM-DEXTROSE 2-4 GM/100ML-% IV SOLN
2.0000 g | INTRAVENOUS | Status: AC
  Administered 2017-09-23: 2 g via INTRAVENOUS

## 2017-09-23 ENCOUNTER — Ambulatory Visit: Payer: No Typology Code available for payment source | Admitting: Anesthesiology

## 2017-09-23 ENCOUNTER — Other Ambulatory Visit: Payer: Self-pay

## 2017-09-23 ENCOUNTER — Encounter
Admission: RE | Disposition: A | Discharge status: Home / Self Care | Payer: Self-pay | Source: Ambulatory Visit | Attending: General Surgery

## 2017-09-23 ENCOUNTER — Ambulatory Visit
Admission: RE | Admit: 2017-09-23 | Discharge: 2017-09-23 | Disposition: A | Discharge status: Home / Self Care | Payer: No Typology Code available for payment source | Source: Ambulatory Visit | Attending: General Surgery | Admitting: General Surgery

## 2017-09-23 ENCOUNTER — Encounter: Payer: Self-pay | Admitting: *Deleted

## 2017-09-23 DIAGNOSIS — K219 Gastro-esophageal reflux disease without esophagitis: Secondary | ICD-10-CM | POA: Insufficient documentation

## 2017-09-23 DIAGNOSIS — F329 Major depressive disorder, single episode, unspecified: Secondary | ICD-10-CM | POA: Diagnosis not present

## 2017-09-23 DIAGNOSIS — Z87891 Personal history of nicotine dependence: Secondary | ICD-10-CM | POA: Insufficient documentation

## 2017-09-23 DIAGNOSIS — Z7951 Long term (current) use of inhaled steroids: Secondary | ICD-10-CM | POA: Insufficient documentation

## 2017-09-23 DIAGNOSIS — F419 Anxiety disorder, unspecified: Secondary | ICD-10-CM | POA: Insufficient documentation

## 2017-09-23 DIAGNOSIS — K811 Chronic cholecystitis: Secondary | ICD-10-CM | POA: Diagnosis not present

## 2017-09-23 DIAGNOSIS — J309 Allergic rhinitis, unspecified: Secondary | ICD-10-CM | POA: Diagnosis not present

## 2017-09-23 DIAGNOSIS — I10 Essential (primary) hypertension: Secondary | ICD-10-CM | POA: Diagnosis not present

## 2017-09-23 DIAGNOSIS — K828 Other specified diseases of gallbladder: Secondary | ICD-10-CM | POA: Diagnosis present

## 2017-09-23 DIAGNOSIS — Z79899 Other long term (current) drug therapy: Secondary | ICD-10-CM | POA: Insufficient documentation

## 2017-09-23 HISTORY — PX: CHOLECYSTECTOMY: SHX55

## 2017-09-23 SURGERY — LAPAROSCOPIC CHOLECYSTECTOMY
Anesthesia: General | Site: Abdomen

## 2017-09-23 MED ORDER — FENTANYL CITRATE (PF) 100 MCG/2ML IJ SOLN
INTRAMUSCULAR | Status: AC
  Filled 2017-09-23: qty 2

## 2017-09-23 MED ORDER — KETOROLAC TROMETHAMINE 30 MG/ML IJ SOLN
30.0000 mg | Freq: Once | INTRAMUSCULAR | Status: AC
  Administered 2017-09-23: 30 mg via INTRAVENOUS

## 2017-09-23 MED ORDER — ONDANSETRON HCL 4 MG/2ML IJ SOLN
INTRAMUSCULAR | Status: DC | PRN
  Administered 2017-09-23: 4 mg via INTRAVENOUS

## 2017-09-23 MED ORDER — LIDOCAINE HCL (CARDIAC) PF 100 MG/5ML IV SOSY
PREFILLED_SYRINGE | INTRAVENOUS | Status: DC | PRN
  Administered 2017-09-23: 100 mg via INTRAVENOUS

## 2017-09-23 MED ORDER — ROCURONIUM BROMIDE 100 MG/10ML IV SOLN
INTRAVENOUS | Status: DC | PRN
  Administered 2017-09-23: 20 mg via INTRAVENOUS
  Administered 2017-09-23: 40 mg via INTRAVENOUS
  Administered 2017-09-23: 20 mg via INTRAVENOUS

## 2017-09-23 MED ORDER — OXYCODONE HCL 5 MG PO TABS
ORAL_TABLET | ORAL | Status: AC
  Filled 2017-09-23: qty 1

## 2017-09-23 MED ORDER — SUCCINYLCHOLINE CHLORIDE 20 MG/ML IJ SOLN
INTRAMUSCULAR | Status: DC | PRN
  Administered 2017-09-23: 100 mg via INTRAVENOUS

## 2017-09-23 MED ORDER — FENTANYL CITRATE (PF) 100 MCG/2ML IJ SOLN
INTRAMUSCULAR | Status: AC
  Administered 2017-09-23: 25 ug via INTRAVENOUS
  Filled 2017-09-23: qty 2

## 2017-09-23 MED ORDER — CEFAZOLIN SODIUM-DEXTROSE 2-4 GM/100ML-% IV SOLN
INTRAVENOUS | Status: AC
  Filled 2017-09-23: qty 100

## 2017-09-23 MED ORDER — OXYCODONE HCL 5 MG/5ML PO SOLN: 5.0000 mg | Freq: Once | ORAL | Status: AC | PRN

## 2017-09-23 MED ORDER — HYDROMORPHONE HCL 1 MG/ML IJ SOLN
0.2500 mg | INTRAMUSCULAR | Status: AC | PRN
  Administered 2017-09-23 (×4): 0.25 mg via INTRAVENOUS

## 2017-09-23 MED ORDER — DEXAMETHASONE SODIUM PHOSPHATE 10 MG/ML IJ SOLN
INTRAMUSCULAR | Status: DC | PRN
  Administered 2017-09-23: 10 mg via INTRAVENOUS

## 2017-09-23 MED ORDER — BUPIVACAINE-EPINEPHRINE (PF) 0.5% -1:200000 IJ SOLN
INTRAMUSCULAR | Status: AC
  Filled 2017-09-23: qty 30

## 2017-09-23 MED ORDER — PROPOFOL 10 MG/ML IV BOLUS
INTRAVENOUS | Status: DC | PRN
  Administered 2017-09-23: 160 mg via INTRAVENOUS

## 2017-09-23 MED ORDER — FAMOTIDINE 20 MG PO TABS
ORAL_TABLET | ORAL | Status: AC
  Filled 2017-09-23: qty 1

## 2017-09-23 MED ORDER — HYDROCODONE-ACETAMINOPHEN 5-325 MG PO TABS: 1.0000 | ORAL_TABLET | ORAL | 0 refills | Status: AC | PRN

## 2017-09-23 MED ORDER — FENTANYL CITRATE (PF) 100 MCG/2ML IJ SOLN
INTRAMUSCULAR | Status: DC | PRN
  Administered 2017-09-23: 100 ug via INTRAVENOUS

## 2017-09-23 MED ORDER — OXYCODONE HCL 5 MG PO TABS
5.0000 mg | ORAL_TABLET | Freq: Once | ORAL | Status: AC | PRN
  Administered 2017-09-23: 5 mg via ORAL

## 2017-09-23 MED ORDER — FENTANYL CITRATE (PF) 100 MCG/2ML IJ SOLN
25.0000 ug | INTRAMUSCULAR | Status: DC | PRN
  Administered 2017-09-23 (×2): 50 ug via INTRAVENOUS
  Administered 2017-09-23 (×2): 25 ug via INTRAVENOUS

## 2017-09-23 MED ORDER — METOPROLOL TARTRATE 5 MG/5ML IV SOLN
INTRAVENOUS | Status: DC | PRN
  Administered 2017-09-23: 1 mg via INTRAVENOUS

## 2017-09-23 MED ORDER — BUPIVACAINE-EPINEPHRINE (PF) 0.5% -1:200000 IJ SOLN
INTRAMUSCULAR | Status: DC | PRN
  Administered 2017-09-23: 8 mL via PERINEURAL

## 2017-09-23 MED ORDER — PROPOFOL 500 MG/50ML IV EMUL
INTRAVENOUS | Status: AC
  Filled 2017-09-23: qty 50

## 2017-09-23 MED ORDER — ACETAMINOPHEN 500 MG PO TABS
1000.0000 mg | ORAL_TABLET | Freq: Once | ORAL | Status: DC | PRN
  Filled 2017-09-23: qty 2

## 2017-09-23 MED ORDER — LACTATED RINGERS IV SOLN
INTRAVENOUS | Status: DC
  Administered 2017-09-23: 07:00:00 via INTRAVENOUS

## 2017-09-23 MED ORDER — LACTATED RINGERS IV SOLN
INTRAVENOUS | Status: DC | PRN
  Administered 2017-09-23: 07:00:00 via INTRAVENOUS

## 2017-09-23 MED ORDER — HYDROMORPHONE HCL 1 MG/ML IJ SOLN
INTRAMUSCULAR | Status: AC
  Administered 2017-09-23: 0.25 mg via INTRAVENOUS
  Filled 2017-09-23: qty 1

## 2017-09-23 MED ORDER — FAMOTIDINE 20 MG PO TABS
20.0000 mg | ORAL_TABLET | Freq: Once | ORAL | Status: AC
  Administered 2017-09-23: 20 mg via ORAL

## 2017-09-23 MED ORDER — FENTANYL CITRATE (PF) 100 MCG/2ML IJ SOLN
INTRAMUSCULAR | Status: AC
  Administered 2017-09-23: 50 ug via INTRAVENOUS
  Filled 2017-09-23: qty 2

## 2017-09-23 MED ORDER — KETOROLAC TROMETHAMINE 30 MG/ML IJ SOLN
INTRAMUSCULAR | Status: AC
  Filled 2017-09-23: qty 1

## 2017-09-23 MED ORDER — MIDAZOLAM HCL 2 MG/2ML IJ SOLN
INTRAMUSCULAR | Status: DC | PRN
  Administered 2017-09-23: 2 mg via INTRAVENOUS

## 2017-09-23 MED ORDER — SUGAMMADEX SODIUM 200 MG/2ML IV SOLN
INTRAVENOUS | Status: DC | PRN
  Administered 2017-09-23: 200 mg via INTRAVENOUS

## 2017-09-23 MED ORDER — MIDAZOLAM HCL 2 MG/2ML IJ SOLN
INTRAMUSCULAR | Status: AC
  Filled 2017-09-23: qty 2

## 2017-09-23 SURGICAL SUPPLY — 39 items
APPLIER CLIP 5 13 M/L LIGAMAX5 (MISCELLANEOUS) ×3
Airseal ×3 IMPLANT
BLADE SURG SZ11 CARB STEEL (BLADE) ×3 IMPLANT
CANISTER SUCT 1200ML W/VALVE (MISCELLANEOUS) ×3 IMPLANT
CHLORAPREP W/TINT 26ML (MISCELLANEOUS) ×3 IMPLANT
CLIP APPLIE 5 13 M/L LIGAMAX5 (MISCELLANEOUS) ×1 IMPLANT
DERMABOND ADVANCED (GAUZE/BANDAGES/DRESSINGS) ×2
DERMABOND ADVANCED .7 DNX12 (GAUZE/BANDAGES/DRESSINGS) ×1 IMPLANT
ELECT REM PT RETURN 9FT ADLT (ELECTROSURGICAL) ×3
ELECTRODE REM PT RTRN 9FT ADLT (ELECTROSURGICAL) ×1 IMPLANT
GLOVE BIO SURGEON STRL SZ 6.5 (GLOVE) ×2 IMPLANT
GLOVE BIO SURGEONS STRL SZ 6.5 (GLOVE) ×1
GOWN STRL REUS W/ TWL LRG LVL3 (GOWN DISPOSABLE) ×3 IMPLANT
GOWN STRL REUS W/TWL LRG LVL3 (GOWN DISPOSABLE) ×6
GRASPER SUT TROCAR 14GX15 (MISCELLANEOUS) ×3 IMPLANT
HEMOSTAT SURGICEL 2X3 (HEMOSTASIS) IMPLANT
IRRIGATION STRYKERFLOW (MISCELLANEOUS) ×1 IMPLANT
IRRIGATOR STRYKERFLOW (MISCELLANEOUS) ×3
IV NS 1000ML (IV SOLUTION) ×2
IV NS 1000ML BAXH (IV SOLUTION) ×1 IMPLANT
KIT TURNOVER KIT A (KITS) ×3 IMPLANT
LABEL OR SOLS (LABEL) ×3 IMPLANT
NEEDLE HYPO 25X1 1.5 SAFETY (NEEDLE) ×3 IMPLANT
NEEDLE INSUFFLATION 14GA 120MM (NEEDLE) ×3 IMPLANT
NS IRRIG 500ML POUR BTL (IV SOLUTION) ×3 IMPLANT
PACK LAP CHOLECYSTECTOMY (MISCELLANEOUS) ×3 IMPLANT
PORT ACCESS TROCAR AIRSEAL 5 (TROCAR) ×3 IMPLANT
POUCH SPECIMEN RETRIEVAL 10MM (ENDOMECHANICALS) ×3 IMPLANT
SCISSORS METZENBAUM CVD 33 (INSTRUMENTS) ×3 IMPLANT
SLEEVE ENDOPATH XCEL 5M (ENDOMECHANICALS) ×6 IMPLANT
SUT MNCRL 4-0 (SUTURE) ×2
SUT MNCRL 4-0 27XMFL (SUTURE) ×1
SUT MNCRL AB 4-0 PS2 18 (SUTURE) ×3 IMPLANT
SUT VIC AB 0 CT1 36 (SUTURE) IMPLANT
SUT VICRYL 0 AB UR-6 (SUTURE) ×3 IMPLANT
SUTURE MNCRL 4-0 27XMF (SUTURE) ×1 IMPLANT
TROCAR XCEL NON-BLD 11X100MML (ENDOMECHANICALS) ×3 IMPLANT
TROCAR XCEL NON-BLD 5MMX100MML (ENDOMECHANICALS) ×3 IMPLANT
TUBING INSUFFLATION (TUBING) IMPLANT

## 2017-09-23 NOTE — Transfer of Care (Signed)
Immediate Anesthesia Transfer of Care Note  Patient: Rodney Cisneros  Procedure(s) Performed: LAPAROSCOPIC CHOLECYSTECTOMY (N/A Abdomen)  Patient Location: PACU  Anesthesia Type:General  Level of Consciousness: awake, alert  and oriented  Airway & Oxygen Therapy: Patient Spontanous Breathing and Patient connected to face mask oxygen  Post-op Assessment: Report given to RN  Post vital signs: Reviewed and stable  Last Vitals:  Vitals Value Taken Time  BP 173/94 09/23/2017  9:03 AM  Temp 36.8 C 09/23/2017  9:03 AM  Pulse    Resp 18 09/23/2017  9:03 AM  SpO2      Last Pain:  Vitals:   09/23/17 0617  TempSrc: Temporal  PainSc: 3          Complications: No apparent anesthesia complications

## 2017-09-23 NOTE — Anesthesia Post-op Follow-up Note (Signed)
Anesthesia QCDR form completed.        

## 2017-09-23 NOTE — Progress Notes (Signed)
   09/23/17 0900  Clinical Encounter Type  Visited With Patient  Visit Type Initial;Spiritual support  Recommendations Follow-up as needed   Chaplain observed patient discomfort and offered energetic prayer for comfort and healing.

## 2017-09-23 NOTE — Anesthesia Preprocedure Evaluation (Signed)
Anesthesia Evaluation  Patient identified by MRN, date of birth, ID band Patient awake    Reviewed: Allergy & Precautions, H&P , NPO status , Patient's Chart, lab work & pertinent test results  Airway Mallampati: III  TM Distance: >3 FB Neck ROM: full    Dental  (+) Teeth Intact   Pulmonary neg pulmonary ROS, former smoker,    breath sounds clear to auscultation       Cardiovascular hypertension, negative cardio ROS   Rhythm:regular Rate:Normal     Neuro/Psych negative neurological ROS  negative psych ROS   GI/Hepatic negative GI ROS, Neg liver ROS, PUD, GERD  Controlled,  Endo/Other  negative endocrine ROS  Renal/GU      Musculoskeletal   Abdominal   Peds  Hematology negative hematology ROS (+)   Anesthesia Other Findings Past Medical History: No date: Anxiety 2016: Chronic ulcerative rectosigmoiditis, with rectal bleeding ([place]) No date: Colitis No date: Depression No date: Diverticulosis No date: Epididymitis No date: GERD (gastroesophageal reflux disease) No date: Hypertension No date: Prostatitis No date: Rhinitis, allergic 2017: Tubular adenoma  Past Surgical History: 1991: BACK SURGERY     Comment:  laminectomy-L5 No date: COLONOSCOPY     Comment:  2016 and 2017 2008: edg No date: FUNCTIONAL ENDOSCOPIC SINUS SURGERY No date: TONSILLECTOMY  BMI    Body Mass Index:  34.06 kg/m      Reproductive/Obstetrics negative OB ROS                             Anesthesia Physical Anesthesia Plan  ASA: II  Anesthesia Plan: General ETT   Post-op Pain Management:    Induction:   PONV Risk Score and Plan: 3 and Ondansetron and Dexamethasone  Airway Management Planned:   Additional Equipment:   Intra-op Plan:   Post-operative Plan:   Informed Consent: I have reviewed the patients History and Physical, chart, labs and discussed the procedure including the risks,  benefits and alternatives for the proposed anesthesia with the patient or authorized representative who has indicated his/her understanding and acceptance.   Dental Advisory Given  Plan Discussed with: Anesthesiologist, CRNA and Surgeon  Anesthesia Plan Comments:         Anesthesia Quick Evaluation

## 2017-09-23 NOTE — Interval H&P Note (Signed)
History and Physical Interval Note:  09/23/2017 7:07 AM  Rodney Cisneros  has presented today for surgery, with the diagnosis of biliary dyskinesia  The various methods of treatment have been discussed with the patient and family. After consideration of risks, benefits and other options for treatment, the patient has consented to  Procedure(s): LAPAROSCOPIC CHOLECYSTECTOMY (N/A) as a surgical intervention .  The patient's history has been reviewed, patient examined, no change in status, stable for surgery.  I have reviewed the patient's chart and labs.  Questions were answered to the patient's satisfaction.     Herbert Pun

## 2017-09-23 NOTE — Discharge Instructions (Signed)
  Diet: Resume home heart healthy regular diet.   Activity: No heavy lifting >20 pounds (children, pets, laundry, garbage) or strenuous activity until follow-up, but light activity and walking are encouraged. Do not drive or drink alcohol if taking narcotic pain medications.  Wound care: May shower with soapy water and pat dry (do not rub incisions), but no baths or submerging incision underwater until follow-up. (no swimming)   Medications: Resume all home medications. For mild to moderate pain: acetaminophen (Tylenol) or ibuprofen (if no kidney disease). Combining Tylenol with alcohol can substantially increase your risk of causing liver disease. Narcotic pain medications, if prescribed, can be used for severe pain, though may cause nausea, constipation, and drowsiness. Do not combine Tylenol and Norco within a 6 hour period as Norco contains Tylenol. If you do not need the narcotic pain medication, you do not need to fill the prescription.  Call office ([phone]) at any time if any questions, worsening pain, fevers/chills, bleeding, drainage from incision site, or other concerns.   AMBULATORY SURGERY  DISCHARGE INSTRUCTIONS   1) The drugs that you were given will stay in your system until tomorrow so for the next 24 hours you should not:  A) Drive an automobile B) Make any legal decisions C) Drink any alcoholic beverage   2) You may resume regular meals tomorrow.  Today it is better to start with liquids and gradually work up to solid foods.  You may eat anything you prefer, but it is better to start with liquids, then soup and crackers, and gradually work up to solid foods.   3) Please notify your doctor immediately if you have any unusual bleeding, trouble breathing, redness and pain at the surgery site, drainage, fever, or pain not relieved by medication.    4) Additional Instructions:        Please contact your physician with any problems or Same Day Surgery at  [phone], Monday through Friday 6 am to 4 pm, or [facility] [place] Main number at [phone]. 

## 2017-09-23 NOTE — Anesthesia Procedure Notes (Signed)
Procedure Name: Intubation Date/Time: 09/23/2017 7:35 AM Performed by: Lesle Reek, CRNA Pre-anesthesia Checklist: Patient identified, Emergency Drugs available, Suction available, Patient being monitored and Timeout performed Patient Re-evaluated:Patient Re-evaluated prior to induction Oxygen Delivery Method: Circle system utilized Preoxygenation: Pre-oxygenation with 100% oxygen Induction Type: IV induction Laryngoscope Size: Mac and 4 Grade View: Grade II Tube type: Oral Tube size: 7.5 mm Number of attempts: 1 Airway Equipment and Method: Stylet Placement Confirmation: ETT inserted through vocal cords under direct vision,  positive ETCO2,  CO2 detector and breath sounds checked- equal and bilateral Secured at: 22 cm Tube secured with: Tape

## 2017-09-23 NOTE — Brief Op Note (Signed)
09/23/2017  9:08 AM  PATIENT:  Rodney Cisneros  52 y.o. male  PRE-OPERATIVE DIAGNOSIS:  biliary dyskinesia  POST-OPERATIVE DIAGNOSIS:  biliary dyskinesia  PROCEDURE:  Procedure(s): LAPAROSCOPIC CHOLECYSTECTOMY (N/A)  SURGEON:  Surgeon(s) and Role:    * Herbert Pun, MD - Primary  ANESTHESIA:   local and general  EBL:  25 mL

## 2017-09-23 NOTE — Op Note (Signed)
Preoperative diagnosis: Biliary Dyskinesia   Postoperative diagnosis: Biliary Dyskinesia.  Procedure: Laparoscopic Cholecystectomy.   Anesthesia: GETA   Surgeon: Dr. Windell Moment  Wound Classification: Clean Contaminated  Indications: Patient is a 52 y.o. male developed right upper quadrant pain and nausea associated with food intake. Endoscopy, colonoscopy, ultrasound, HIDA, CT scan has been done and has been negative for GI pathology. Gastroenterologist recommended cholecystectomy due to right upper quadrant pain associated with food intake consistent with biliary colic. Patient was oriented that resolution of pain can not be guaranteed after cholecystectomy. Laparoscopic cholecystectomy was elected.  Findings: Critical view of safety achieved Cystic duct and artery identified, ligated and divided Adequate hemostasis  Description of procedure: The patient was placed on the operating table in the supine position. General anesthesia was induced. A time-out was completed verifying correct patient, procedure, site, positioning, and implant(s) and/or special equipment prior to beginning this procedure. An orogastric tube was placed. The abdomen was prepped and draped in the usual sterile fashion.  An incision was made in a natural skin line above the umbilicus.  The fascia was elevated and the Veress needle inserted. Proper position was confirmed by aspiration and saline meniscus test.  The abdomen was insufflated with carbon dioxide to a pressure of 15 mmHg. The patient tolerated insufflation well. A 11-mm trocar was then inserted.  The laparoscope was inserted and the abdomen inspected. No injuries from initial trocar placement were noted. Additional trocars were then inserted in the following locations: a 5-mm trocar in the right epigastrium and two 5-mm trocars along the right costal margin. The abdomen was inspected and no abnormalities were found. The table was placed in the reverse  Trendelenburg position with the right side up.  Filmy adhesions between the gallbladder and omentum, duodenum and transverse colon were lysed sharply. The dome of the gallbladder was grasped with an atraumatic grasper passed through the lateral port and retracted over the dome of the liver. The infundibulum was also grasped with an atraumatic grasper through the midclavicular port and retracted toward the right lower quadrant. This maneuver exposed Calot's triangle. The peritoneum overlying the gallbladder infundibulum was then incised and the cystic duct and cystic artery identified and circumferentially dissected. Critical view of safety reviewed before ligating any structure. The cystic duct and cystic artery were then doubly clipped and divided close to the gallbladder.  The gallbladder was then dissected from its peritoneal attachments by electrocautery. Hemostasis was checked and the gallbladder and contained stones were removed using an endoscopic retrieval bag placed through the umbilical port. The gallbladder was passed off the table as a specimen. The gallbladder fossa was copiously irrigated with saline and hemostasis was obtained. There was no evidence of bleeding from the gallbladder fossa or cystic artery or leakage of the bile from the cystic duct stump. Secondary trocars were removed under direct vision. No bleeding was noted. The laparoscope was withdrawn and the umbilical trocar removed. The abdomen was allowed to collapse. The fascia of the 67mm trocar sites was closed with figure-of-eight 0 vicryl sutures. The skin was closed with subcuticular sutures of 4-0 monocryl and topical skin adhesive. The orogastric tube was removed.  The patient tolerated the procedure well and was taken to the postanesthesia care unit in stable condition.   Specimen: Gallbladder  Complications: None  EBL: 25 mL

## 2017-09-24 ENCOUNTER — Encounter: Payer: Self-pay | Admitting: General Surgery

## 2017-09-24 NOTE — Anesthesia Postprocedure Evaluation (Signed)
Anesthesia Post Note  Patient: Rodney Cisneros  Procedure(s) Performed: LAPAROSCOPIC CHOLECYSTECTOMY (N/A Abdomen)  Patient location during evaluation: PACU Anesthesia Type: General Level of consciousness: awake and alert Pain management: pain level controlled Vital Signs Assessment: post-procedure vital signs reviewed and stable Respiratory status: spontaneous breathing, nonlabored ventilation and respiratory function stable Cardiovascular status: blood pressure returned to baseline and stable Postop Assessment: no apparent nausea or vomiting Anesthetic complications: no     Last Vitals:  Vitals:   09/23/17 1022 09/23/17 1038  BP: (!) 141/80 138/82  Pulse: 94 93  Resp: 16 16  Temp: (!) 36.2 C   SpO2: 92% 93%    Last Pain:  Vitals:   09/23/17 1038  TempSrc:   PainSc: [place]

## 2017-09-25 ENCOUNTER — Encounter: Payer: Self-pay | Admitting: General Surgery

## 2017-09-25 LAB — SURGICAL PATHOLOGY

## 2017-12-01 ENCOUNTER — Other Ambulatory Visit
Admission: RE | Admit: 2017-12-01 | Discharge: 2017-12-01 | Disposition: A | Discharge status: Home / Self Care | Payer: PRIVATE HEALTH INSURANCE | Source: Ambulatory Visit | Attending: Gastroenterology | Admitting: Gastroenterology

## 2017-12-01 DIAGNOSIS — R197 Diarrhea, unspecified: Secondary | ICD-10-CM | POA: Diagnosis present

## 2017-12-01 LAB — GASTROINTESTINAL PANEL BY PCR, STOOL (REPLACES STOOL CULTURE)
ADENOVIRUS F40/41: NOT DETECTED
Astrovirus: NOT DETECTED
CAMPYLOBACTER SPECIES: NOT DETECTED
CRYPTOSPORIDIUM: NOT DETECTED
CYCLOSPORA CAYETANENSIS: NOT DETECTED
ENTEROPATHOGENIC E COLI (EPEC): NOT DETECTED
Entamoeba histolytica: NOT DETECTED
Enteroaggregative E coli (EAEC): NOT DETECTED
Enterotoxigenic E coli (ETEC): NOT DETECTED
Giardia lamblia: NOT DETECTED
Norovirus GI/GII: NOT DETECTED
PLESIMONAS SHIGELLOIDES: NOT DETECTED
Rotavirus A: NOT DETECTED
SALMONELLA SPECIES: NOT DETECTED
Sapovirus (I, II, IV, and V): NOT DETECTED
Shiga like toxin producing E coli (STEC): NOT DETECTED
Shigella/Enteroinvasive E coli (EIEC): NOT DETECTED
VIBRIO SPECIES: NOT DETECTED
Vibrio cholerae: NOT DETECTED
Yersinia enterocolitica: NOT DETECTED

## 2017-12-01 LAB — C DIFFICILE QUICK SCREEN W PCR REFLEX
C Diff antigen: NEGATIVE
C Diff interpretation: NOT DETECTED
C Diff toxin: NEGATIVE

## 2018-06-30 ENCOUNTER — Telehealth: Payer: Self-pay | Admitting: Nutrition

## 2018-07-07 NOTE — Telephone Encounter (Signed)
Opened in error

## 2019-02-12 IMAGING — NM NM HEPATO W/GB/PHARM/[PERSON_NAME]
2 series · 12 of 12 positions shown · non-contrast
Comparison: None

CLINICAL DATA: RIGHT upper quadrant pain, nausea and some vomiting
for 1.5 years, associated fatigue, sleepiness, change in bowel
habits with varying constipation and diarrhea

EXAM:
NUCLEAR MEDICINE HEPATOBILIARY IMAGING WITH GALLBLADDER EF
TECHNIQUE: Sequential images of the abdomen were obtained [DATE] minutes
following intravenous administration of radiopharmaceutical. After
oral ingestion of Ensure, gallbladder ejection fraction was
determined. At 60 min, normal ejection fraction is greater than 33%.
RADIOPHARMACEUTICALS:  5.33 mCi Wc-NNm  Choletec IV

[Series 1000: hepatobiliary scan · 9.59mm/px · 6 of 60 frames shown]
[frame 6/60]
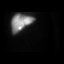
[frame 16/60]
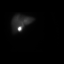
[frame 26/60]
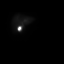
[frame 36/60]
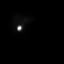
[frame 46/60]
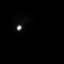
[frame 56/60]
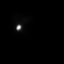

[Series 1000: gallbladder ef · 4.80mm/px · 6 of 120 frames shown]
[frame 11/120]
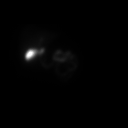
[frame 31/120]
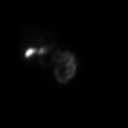
[frame 51/120]
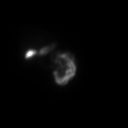
[frame 71/120]
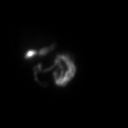
[frame 91/120]
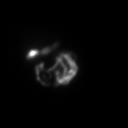
[frame 111/120]
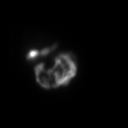

[12 of 12 positions shown; findings below may reference images not displayed]

FINDINGS: Normal tracer extraction from bloodstream indicating normal
hepatocellular function.

Normal excretion of tracer into biliary tree.

Gallbladder visualized at 10 min.

Small bowel visualized at 57 min.

No hepatic retention of tracer.

Subjectively normal emptying of tracer from gallbladder following
fatty meal stimulation.

Calculated gallbladder ejection fraction is 73%, normal.

Patient reported abdominal pain and cramping following Ensure
ingestion rated at [DATE].

Normal gallbladder ejection fraction following Ensure ingestion is
greater than 33% at 1 hour.
IMPRESSION: Normal exam.

Please note that the patient reported abdominal pain and cramping
following Ensure ingestion.

## 2021-06-15 ENCOUNTER — Encounter: Payer: Self-pay | Admitting: Medical Oncology

## 2021-06-15 ENCOUNTER — Emergency Department
Admission: EM | Admit: 2021-06-15 | Discharge: 2021-06-15 | Disposition: A | Discharge status: Home / Self Care | Payer: PRIVATE HEALTH INSURANCE | Attending: Emergency Medicine | Admitting: Emergency Medicine

## 2021-06-15 DIAGNOSIS — Y9389 Activity, other specified: Secondary | ICD-10-CM | POA: Insufficient documentation

## 2021-06-15 DIAGNOSIS — S01411A Laceration without foreign body of right cheek and temporomandibular area, initial encounter: Secondary | ICD-10-CM | POA: Insufficient documentation

## 2021-06-15 DIAGNOSIS — W540XXA Bitten by dog, initial encounter: Secondary | ICD-10-CM | POA: Insufficient documentation

## 2021-06-15 DIAGNOSIS — I10 Essential (primary) hypertension: Secondary | ICD-10-CM | POA: Insufficient documentation

## 2021-06-15 MED ORDER — LIDOCAINE-EPINEPHRINE-TETRACAINE (LET) TOPICAL GEL
3.0000 mL | Freq: Once | TOPICAL | Status: AC
  Administered 2021-06-15: 3 mL via TOPICAL

## 2021-06-15 MED ORDER — AMOXICILLIN-POT CLAVULANATE 875-125 MG PO TABS: 1.0000 | ORAL_TABLET | Freq: Two times a day (BID) | ORAL | 0 refills | Status: AC

## 2021-06-15 MED ORDER — BACITRACIN ZINC 500 UNIT/GM EX OINT: TOPICAL_OINTMENT | Freq: Two times a day (BID) | CUTANEOUS | Status: DC

## 2021-06-15 MED ORDER — LIDOCAINE-EPINEPHRINE (PF) 2 %-1:200000 IJ SOLN: 10.0000 mL | Freq: Once | INTRAMUSCULAR | Status: DC

## 2021-06-15 NOTE — ED Provider Notes (Signed)
[facility] Provider Note    Event Date/Time   First MD Initiated Contact with Patient 06/15/21 1813     (approximate)   History   Chief Complaint Animal Bite   HPI Rodney Cisneros is a 56 y.o. male, history of hypertension, GERD, anxiety, depression, presents to the emergency department for evaluation of dog bite.  Patient states that he was playing ball with his dog when the dog jumped up and accidentally bit his right cheek.  He states that the dog is up-to-date on all shots.  Patient is up-to-date on tetanus.  Denies any significant head injury or LOC.  Denies any other injuries at this time.  History Limitations: No limitations.        Physical Exam  Triage Vital Signs: ED Triage Vitals [06/15/21 1805]  Enc Vitals Group     BP (!) 167/91     Pulse Rate 80     Resp 18     Temp 98.3 F (36.8 C)     Temp Source Oral     SpO2 96 %     Weight 250 lb (113.4 kg)     Height 6' (1.829 m)     Head Circumference      Peak Flow      Pain Score 7     Pain Loc      Pain Edu?      Excl. in [place]?     Most recent vital signs: Vitals:   06/15/21 1805  BP: (!) 167/91  Pulse: 80  Resp: 18  Temp: 98.3 F (36.8 C)  SpO2: 96%    General: Awake, NAD.  Skin: Warm, dry. No rashes or lesions.  Eyes: PERRL. Conjunctivae normal.  CV: Good peripheral perfusion.  Resp: Normal effort.  Abd: Soft, non-tender. No distention.  Neuro: At baseline. No gross neurological deficits.   Focused Exam: Triangle shaped laceration noted along the right cheek, approximately 4 cm in total length.  No active bleeding or discharge.  No surrounding warmth or erythema.  No bony tenderness.  Physical Exam    ED Results / Procedures / Treatments  Labs (all labs ordered are listed, but only abnormal results are displayed) Labs Reviewed - No data to display   EKG N/A.   RADIOLOGY  ED Provider Interpretation: N/A.  No results found.  PROCEDURES:  Critical  Care performed: N/A.  Marland Kitchen.Laceration Repair  Date/Time: 06/15/2021 9:17 PM Performed by: Teodoro Spray, PA Authorized by: Teodoro Spray, PA   Consent:    Consent obtained:  Verbal   Consent given by:  Patient   Risks discussed:  Infection, pain, poor cosmetic result and retained foreign body Universal protocol:    Patient identity confirmed:  Verbally with patient Anesthesia:    Anesthesia method:  Topical application   Topical anesthetic:  LET Laceration details:    Location:  Face   Face location:  R cheek   Length (cm):  4   Depth (mm):  2 Pre-procedure details:    Preparation:  Patient was prepped and draped in usual sterile fashion Exploration:    Hemostasis achieved with:  Direct pressure   Wound extent: no foreign bodies/material noted, no underlying fracture noted and no vascular damage noted   Treatment:    Area cleansed with:  Soap and water   Amount of cleaning:  Extensive   Irrigation solution:  Tap water   Irrigation volume:  3 L   Irrigation method:  Tap  Debridement:  None Skin repair:    Repair method:  Sutures   Suture size:  5-0   Suture material:  Fast-absorbing gut   Suture technique:  Simple interrupted   Number of sutures:  5 Approximation:    Approximation:  Close Repair type:    Repair type:  Simple Post-procedure details:    Procedure completion:  Tolerated well, no immediate complications    MEDICATIONS ORDERED IN ED: Medications  bacitracin ointment (has no administration in time range)  lidocaine-EPINEPHrine-tetracaine (LET) topical gel (3 mLs Topical Given 06/15/21 1920)     IMPRESSION / MDM / ASSESSMENT AND PLAN / ED COURSE  I reviewed the triage vital signs and the nursing notes.                              Differential diagnosis includes, but is not limited to, dog bite, facial laceration, cellulitis, foreign body.  ED Course Patient appears well, NAD.  Assessment/Plan Patient presents with facial laceration  secondary to dog bite.  The dog is his own and is able to be quarantined.  Dog is up-to-date on all shots.  No need for rabies prophylaxis at this point.  He is also up-to-date on tetanus.  Cleansed the area extensively with soap and water.  No evidence of foreign bodies.  Anesthetized with LAT topical gel.  Repaired the laceration utilizing five 5-0 absorbable sutures.  Patient tolerated the procedure with no immediate complications.  We will plan to discharge him with prescription for Augmentin.  Patient's presentation is most consistent with acute, uncomplicated illness.   Provided the patient with anticipatory guidance, return precautions, and educational material. Encouraged the patient to return to the emergency department at any time if they begin to experience any new or worsening symptoms. Patient expressed understanding and agreed with the plan.       FINAL CLINICAL IMPRESSION(S) / ED DIAGNOSES   Final diagnoses:  Dog bite of face, initial encounter     Rx / DC Orders   ED Discharge Orders          Ordered    amoxicillin-clavulanate (AUGMENTIN) 875-125 MG tablet  2 times daily        06/15/21 2120             Note:  This document was prepared using Dragon voice recognition software and may include unintentional dictation errors.   Teodoro Spray, [place] 06/15/21 2120    Rada Hay, MD 06/16/21 1745

## 2021-06-15 NOTE — ED Triage Notes (Signed)
Pt reports that he was playing ball with his dog and the dog jumped up and bit his right cheek. Pt reports dogs shots are UTD and pts tetanus is UTD.

## 2021-06-15 NOTE — Discharge Instructions (Addendum)
-  Take all of the antibiotics prescribed.  -You may take Tylenol or ibuprofen as needed for pain  -Apply a antibiotic ointment to the site daily and cover with bandage.  -The sutures will dissolve on their own in 5 to 7 days.  -Return to the emergency department anytime if you begin to experience any new or worsening symptoms.

## 2022-02-18 ENCOUNTER — Encounter: Payer: Self-pay | Admitting: Emergency Medicine

## 2022-02-18 ENCOUNTER — Emergency Department
Admission: EM | Admit: 2022-02-18 | Discharge: 2022-02-18 | Disposition: A | Discharge status: Home / Self Care | Payer: Medicaid Other | Attending: Emergency Medicine | Admitting: Emergency Medicine

## 2022-02-18 ENCOUNTER — Other Ambulatory Visit: Payer: Self-pay

## 2022-02-18 DIAGNOSIS — M545 Low back pain, unspecified: Secondary | ICD-10-CM | POA: Diagnosis present

## 2022-02-18 DIAGNOSIS — M5416 Radiculopathy, lumbar region: Secondary | ICD-10-CM | POA: Insufficient documentation

## 2022-02-18 DIAGNOSIS — I10 Essential (primary) hypertension: Secondary | ICD-10-CM | POA: Diagnosis not present

## 2022-02-18 MED ORDER — LIDOCAINE 5 % EX PTCH: 1.0000 | MEDICATED_PATCH | Freq: Two times a day (BID) | CUTANEOUS | 0 refills | Status: DC

## 2022-02-18 MED ORDER — LIDOCAINE 5 % EX PTCH
1.0000 | MEDICATED_PATCH | CUTANEOUS | Status: DC
  Administered 2022-02-18: 1 via TRANSDERMAL
  Filled 2022-02-18: qty 1

## 2022-02-18 MED ORDER — CYCLOBENZAPRINE HCL 5 MG PO TABS: 5.0000 mg | ORAL_TABLET | Freq: Three times a day (TID) | ORAL | 0 refills | Status: DC | PRN

## 2022-02-18 MED ORDER — KETOROLAC TROMETHAMINE 30 MG/ML IJ SOLN
30.0000 mg | Freq: Once | INTRAMUSCULAR | Status: AC
  Administered 2022-02-18: 30 mg via INTRAMUSCULAR
  Filled 2022-02-18: qty 1

## 2022-02-18 NOTE — ED Triage Notes (Addendum)
Pt here with back pain. Pt had a previous back surgery. Pt denies fall or injury. Pt states after work Sunday he could not move or walk. Pt states pain is down both legs across his back. Pt states the only way he can walk is if he is bent over. Pt states he fell last Sunday but did not injure his back.

## 2022-02-18 NOTE — ED Provider Notes (Signed)
[facility] Provider Note    Event Date/Time   First MD Initiated Contact with Patient 02/18/22 [phone]     (approximate)   History   Chief Complaint Back Pain   HPI  Rodney Cisneros is a 57 y.o. male with past medical history of hypertension and GERD who presents to the ED complaining of back pain.  Patient reports that 3 days ago he began having pain in both sides of his lower back while at work.  He states he will deal with occasional flareups of pain in his back ever since he had surgery in his 35s, but pain this time has been more severe and longer lasting.  He describes sharp pain radiating down both legs, has been taking Tylenol, ibuprofen, and Lidoderm patches without significant relief.  He denies any numbness or weakness in his legs, has not had any saddle anesthesia or difficulty urinating.     Physical Exam   Triage Vital Signs: ED Triage Vitals  Enc Vitals Group     BP 02/18/22 0840 109/74     Pulse Rate 02/18/22 0840 80     Resp 02/18/22 0840 16     Temp 02/18/22 0840 98.8 F (37.1 C)     Temp Source 02/18/22 0840 Oral     SpO2 02/18/22 0840 95 %     Weight 02/18/22 0841 250 lb (113.4 kg)     Height 02/18/22 0841 6' (1.829 m)     Head Circumference --      Peak Flow --      Pain Score 02/18/22 0841 10     Pain Loc --      Pain Edu? --      Excl. in [place]? --     Most recent vital signs: Vitals:   02/18/22 0840  BP: 109/74  Pulse: 80  Resp: 16  Temp: 98.8 F (37.1 C)  SpO2: 95%    Constitutional: Alert and oriented. Eyes: Conjunctivae are normal. Head: Atraumatic. Nose: No congestion/rhinnorhea. Mouth/Throat: Mucous membranes are moist.  Cardiovascular: Normal rate, regular rhythm. Grossly normal heart sounds.  2+ radial and DP pulses bilaterally. Respiratory: Normal respiratory effort.  No retractions. Lungs CTAB. Gastrointestinal: Soft and nontender. No distention. Musculoskeletal: No lower extremity tenderness nor  edema.  No midline lumbar spinal tenderness to palpation. Neurologic:  Normal speech and language. No gross focal neurologic deficits are appreciated.    ED Results / Procedures / Treatments   Labs (all labs ordered are listed, but only abnormal results are displayed) Labs Reviewed - No data to display   PROCEDURES:  Critical Care performed: No  Procedures   MEDICATIONS ORDERED IN ED: Medications  ketorolac (TORADOL) 30 MG/ML injection 30 mg (has no administration in time range)  lidocaine (LIDODERM) 5 % 1 patch (has no administration in time range)     IMPRESSION / MDM / ASSESSMENT AND PLAN / ED COURSE  I reviewed the triage vital signs and the nursing notes.                              57 y.o. male with past medical history of hypertension and GERD who presents to the ED with acute on chronic lower back pain starting at work 3 days ago with no preceding trauma.  Patient's presentation is most consistent with acute, uncomplicated illness.  Differential diagnosis includes, but is not limited to, lumbar strain, lumbar radiculopathy, cauda equina.  Patient well-appearing and in no acute distress, vital signs are unremarkable.  He is neurovascular intact to his bilateral lower extremities, no findings concerning for cauda equina.  With no recent trauma I do not feel imaging is indicated.  We will treat symptomatically with IM Toradol and Lidoderm patch.  Patient is appropriate for discharge home with PCP follow-up, will be prescribed small amount of muscle relaxants in addition to Lidoderm patches.  He was counseled to return to the ED for new or worsening symptoms, patient agrees with plan.      FINAL CLINICAL IMPRESSION(S) / ED DIAGNOSES   Final diagnoses:  Lumbar radiculopathy     Rx / DC Orders   ED Discharge Orders          Ordered    lidocaine (LIDODERM) 5 %  Every 12 hours        02/18/22 0924    cyclobenzaprine (FLEXERIL) 5 MG tablet  3 times daily PRN         02/18/22 1245             Note:  This document was prepared using Dragon voice recognition software and may include unintentional dictation errors.   Blake Divine, MD 02/18/22 [phone]

## 2022-03-06 ENCOUNTER — Other Ambulatory Visit: Payer: Self-pay | Admitting: Physician Assistant

## 2022-03-06 DIAGNOSIS — S32010A Wedge compression fracture of first lumbar vertebra, initial encounter for closed fracture: Secondary | ICD-10-CM

## 2022-03-09 ENCOUNTER — Ambulatory Visit
Admission: RE | Admit: 2022-03-09 | Discharge: 2022-03-09 | Disposition: A | Discharge status: Home / Self Care | Payer: Medicaid Other | Source: Ambulatory Visit | Attending: Physician Assistant | Admitting: Physician Assistant

## 2022-03-09 DIAGNOSIS — S32010A Wedge compression fracture of first lumbar vertebra, initial encounter for closed fracture: Secondary | ICD-10-CM | POA: Insufficient documentation

## 2022-03-13 ENCOUNTER — Emergency Department: Payer: Medicaid Other | Admitting: Certified Registered Nurse Anesthetist

## 2022-03-13 ENCOUNTER — Other Ambulatory Visit: Payer: Self-pay

## 2022-03-13 ENCOUNTER — Observation Stay
Admission: EM | Admit: 2022-03-13 | Discharge: 2022-03-14 | Disposition: A | Discharge status: Home / Self Care | Payer: Medicaid Other | Attending: Neurosurgery | Admitting: Neurosurgery

## 2022-03-13 ENCOUNTER — Encounter
Admission: EM | Disposition: A | Discharge status: Home / Self Care | Payer: Self-pay | Source: Home / Self Care | Attending: Emergency Medicine

## 2022-03-13 ENCOUNTER — Encounter: Payer: Self-pay | Admitting: *Deleted

## 2022-03-13 ENCOUNTER — Emergency Department: Payer: Medicaid Other

## 2022-03-13 DIAGNOSIS — M48061 Spinal stenosis, lumbar region without neurogenic claudication: Secondary | ICD-10-CM | POA: Diagnosis not present

## 2022-03-13 DIAGNOSIS — W19XXXA Unspecified fall, initial encounter: Secondary | ICD-10-CM | POA: Diagnosis not present

## 2022-03-13 DIAGNOSIS — S064XAA Epidural hemorrhage with loss of consciousness status unknown, initial encounter: Secondary | ICD-10-CM | POA: Diagnosis present

## 2022-03-13 DIAGNOSIS — Z79899 Other long term (current) drug therapy: Secondary | ICD-10-CM | POA: Diagnosis not present

## 2022-03-13 DIAGNOSIS — Z87891 Personal history of nicotine dependence: Secondary | ICD-10-CM | POA: Diagnosis not present

## 2022-03-13 DIAGNOSIS — G834 Cauda equina syndrome: Principal | ICD-10-CM | POA: Insufficient documentation

## 2022-03-13 DIAGNOSIS — S064X0A Epidural hemorrhage without loss of consciousness, initial encounter: Secondary | ICD-10-CM | POA: Diagnosis not present

## 2022-03-13 DIAGNOSIS — G9589 Other specified diseases of spinal cord: Secondary | ICD-10-CM | POA: Diagnosis not present

## 2022-03-13 DIAGNOSIS — I10 Essential (primary) hypertension: Secondary | ICD-10-CM | POA: Insufficient documentation

## 2022-03-13 DIAGNOSIS — M549 Dorsalgia, unspecified: Secondary | ICD-10-CM | POA: Diagnosis present

## 2022-03-13 HISTORY — PX: LUMBAR LAMINECTOMY/ DECOMPRESSION WITH MET-RX: SHX5959

## 2022-03-13 LAB — CBC WITH DIFFERENTIAL/PLATELET
Abs Immature Granulocytes: 0.02 10*3/uL (ref 0.00–0.07)
Basophils Absolute: 0 10*3/uL (ref 0.0–0.1)
Basophils Relative: 1 %
Eosinophils Absolute: 0.3 10*3/uL (ref 0.0–0.5)
Eosinophils Relative: 4 %
HCT: 42.1 % (ref 39.0–52.0)
Hemoglobin: 14.4 g/dL (ref 13.0–17.0)
Immature Granulocytes: 0 %
Lymphocytes Relative: 18 %
Lymphs Abs: 1.6 10*3/uL (ref 0.7–4.0)
MCH: 31.4 pg (ref 26.0–34.0)
MCHC: 34.2 g/dL (ref 30.0–36.0)
MCV: 91.9 fL (ref 80.0–100.0)
Monocytes Absolute: 0.6 10*3/uL (ref 0.1–1.0)
Monocytes Relative: 7 %
Neutro Abs: 6.2 10*3/uL (ref 1.7–7.7)
Neutrophils Relative %: 70 %
Platelets: 320 10*3/uL (ref 150–400)
RBC: 4.58 MIL/uL (ref 4.22–5.81)
RDW: 11.9 % (ref 11.5–15.5)
WBC: 8.8 10*3/uL (ref 4.0–10.5)
nRBC: 0 % (ref 0.0–0.2)

## 2022-03-13 LAB — BASIC METABOLIC PANEL
Anion gap: 11 (ref 5–15)
BUN: 21 mg/dL — ABNORMAL HIGH (ref 6–20)
CO2: 25 mmol/L (ref 22–32)
Calcium: 10.1 mg/dL (ref 8.9–10.3)
Chloride: 98 mmol/L (ref 98–111)
Creatinine, Ser: 0.97 mg/dL (ref 0.61–1.24)
GFR, Estimated: >60 mL/min (ref >60)
Glucose, Bld: 105 mg/dL — ABNORMAL HIGH (ref 70–99)
Potassium: 4.2 mmol/L (ref 3.5–5.1)
Sodium: 134 mmol/L — ABNORMAL LOW (ref 135–145)

## 2022-03-13 LAB — TYPE AND SCREEN
ABO/RH(D): A POS
Antibody Screen: NEGATIVE

## 2022-03-13 LAB — PROTIME-INR
INR: 0.9 (ref 0.8–1.2)
Prothrombin Time: 12.3 seconds (ref 11.4–15.2)

## 2022-03-13 SURGERY — LUMBAR LAMINECTOMY/ DECOMPRESSION WITH MET-RX
Anesthesia: General | Laterality: Left

## 2022-03-13 MED ORDER — METHOCARBAMOL 500 MG PO TABS: 500.0000 mg | ORAL_TABLET | Freq: Four times a day (QID) | ORAL | Status: DC | PRN

## 2022-03-13 MED ORDER — HYDROMORPHONE HCL 1 MG/ML IJ SOLN
1.0000 mg | Freq: Once | INTRAMUSCULAR | Status: AC
  Administered 2022-03-13: 1 mg via INTRAVENOUS
  Filled 2022-03-13: qty 1

## 2022-03-13 MED ORDER — BISACODYL 10 MG RE SUPP: 10.0000 mg | Freq: Every day | RECTAL | Status: DC | PRN

## 2022-03-13 MED ORDER — PHENYLEPHRINE HCL (PRESSORS) 10 MG/ML IV SOLN
INTRAVENOUS | Status: DC | PRN
  Administered 2022-03-13: 160 ug via INTRAVENOUS
  Administered 2022-03-13: 180 ug via INTRAVENOUS
  Administered 2022-03-13: 80 ug via INTRAVENOUS

## 2022-03-13 MED ORDER — METOPROLOL TARTRATE 25 MG PO TABS
50.0000 mg | ORAL_TABLET | Freq: Two times a day (BID) | ORAL | Status: DC
  Filled 2022-03-13 (×2): qty 2

## 2022-03-13 MED ORDER — HYDROMORPHONE HCL 1 MG/ML IJ SOLN
INTRAMUSCULAR | Status: AC
  Filled 2022-03-13: qty 1

## 2022-03-13 MED ORDER — HYDROMORPHONE HCL 1 MG/ML IJ SOLN
INTRAMUSCULAR | Status: DC | PRN
  Administered 2022-03-13 (×2): .5 mg via INTRAVENOUS

## 2022-03-13 MED ORDER — LACTATED RINGERS IV SOLN: INTRAVENOUS | Status: DC | PRN

## 2022-03-13 MED ORDER — PROPOFOL 10 MG/ML IV BOLUS
INTRAVENOUS | Status: DC | PRN
  Administered 2022-03-13: 200 mg via INTRAVENOUS
  Administered 2022-03-13 (×2): 50 mg via INTRAVENOUS

## 2022-03-13 MED ORDER — OXYCODONE HCL 5 MG/5ML PO SOLN: 5.0000 mg | Freq: Once | ORAL | Status: DC | PRN

## 2022-03-13 MED ORDER — BUPIVACAINE HCL (PF) 0.5 % IJ SOLN
INTRAMUSCULAR | Status: AC
  Filled 2022-03-13: qty 30

## 2022-03-13 MED ORDER — DEXAMETHASONE SODIUM PHOSPHATE 10 MG/ML IJ SOLN
INTRAMUSCULAR | Status: DC | PRN
  Administered 2022-03-13: 10 mg via INTRAVENOUS

## 2022-03-13 MED ORDER — SODIUM CHLORIDE (PF) 0.9 % IJ SOLN
INTRAMUSCULAR | Status: DC | PRN
  Administered 2022-03-13: 60 mL

## 2022-03-13 MED ORDER — ENOXAPARIN SODIUM 40 MG/0.4ML IJ SOSY: 40.0000 mg | PREFILLED_SYRINGE | INTRAMUSCULAR | Status: DC

## 2022-03-13 MED ORDER — KETOROLAC TROMETHAMINE 15 MG/ML IJ SOLN
INTRAMUSCULAR | Status: AC
  Administered 2022-03-13: 15 mg via INTRAVENOUS
  Filled 2022-03-13: qty 1

## 2022-03-13 MED ORDER — DULOXETINE HCL 60 MG PO CPEP
60.0000 mg | ORAL_CAPSULE | Freq: Every day | ORAL | Status: DC
  Administered 2022-03-14: 60 mg via ORAL
  Filled 2022-03-13: qty 1

## 2022-03-13 MED ORDER — ALBUTEROL SULFATE (2.5 MG/3ML) 0.083% IN NEBU: 2.5000 mg | INHALATION_SOLUTION | RESPIRATORY_TRACT | Status: DC | PRN

## 2022-03-13 MED ORDER — KETOROLAC TROMETHAMINE 30 MG/ML IJ SOLN
INTRAMUSCULAR | Status: DC | PRN
  Administered 2022-03-13: 30 mg via INTRAVENOUS

## 2022-03-13 MED ORDER — SENNA 8.6 MG PO TABS
ORAL_TABLET | ORAL | Status: AC
  Administered 2022-03-13: 8.6 mg via ORAL
  Filled 2022-03-13: qty 1

## 2022-03-13 MED ORDER — EPINEPHRINE PF 1 MG/ML IJ SOLN
INTRAMUSCULAR | Status: DC | PRN
  Administered 2022-03-13: 8 mL

## 2022-03-13 MED ORDER — MORPHINE SULFATE (PF) 2 MG/ML IV SOLN: 1.0000 mg | INTRAVENOUS | Status: DC | PRN

## 2022-03-13 MED ORDER — ESMOLOL HCL-SODIUM CHLORIDE 2000 MG/100ML IV SOLN
INTRAVENOUS | Status: DC | PRN
  Administered 2022-03-13 (×2): 20 mg via INTRAVENOUS

## 2022-03-13 MED ORDER — DOCUSATE SODIUM 100 MG PO CAPS: 100.0000 mg | ORAL_CAPSULE | Freq: Two times a day (BID) | ORAL | Status: DC

## 2022-03-13 MED ORDER — SURGIFLO WITH THROMBIN (HEMOSTATIC MATRIX KIT) OPTIME
TOPICAL | Status: DC | PRN
  Administered 2022-03-13: 1 via TOPICAL

## 2022-03-13 MED ORDER — PHENOL 1.4 % MT LIQD: 1.0000 | OROMUCOSAL | Status: DC | PRN

## 2022-03-13 MED ORDER — PROMETHAZINE HCL 25 MG/ML IJ SOLN: 6.2500 mg | INTRAMUSCULAR | Status: DC | PRN

## 2022-03-13 MED ORDER — SENNA 8.6 MG PO TABS: 1.0000 | ORAL_TABLET | Freq: Two times a day (BID) | ORAL | Status: DC

## 2022-03-13 MED ORDER — HYDRALAZINE HCL 20 MG/ML IJ SOLN
INTRAMUSCULAR | Status: DC | PRN
  Administered 2022-03-13 (×3): 5 mg via INTRAVENOUS

## 2022-03-13 MED ORDER — LISINOPRIL 20 MG PO TABS: 20.0000 mg | ORAL_TABLET | Freq: Every day | ORAL | Status: DC

## 2022-03-13 MED ORDER — MIDAZOLAM HCL 2 MG/2ML IJ SOLN
INTRAMUSCULAR | Status: DC | PRN
  Administered 2022-03-13: 2 mg via INTRAVENOUS

## 2022-03-13 MED ORDER — CEFAZOLIN SODIUM-DEXTROSE 2-4 GM/100ML-% IV SOLN
2.0000 g | Freq: Once | INTRAVENOUS | Status: AC
  Administered 2022-03-13: 2 g via INTRAVENOUS

## 2022-03-13 MED ORDER — CEFAZOLIN SODIUM-DEXTROSE 2-4 GM/100ML-% IV SOLN
INTRAVENOUS | Status: AC
  Filled 2022-03-13: qty 100

## 2022-03-13 MED ORDER — MAGNESIUM CITRATE PO SOLN: 1.0000 | Freq: Once | ORAL | Status: DC | PRN

## 2022-03-13 MED ORDER — ONDANSETRON HCL 4 MG PO TABS: 4.0000 mg | ORAL_TABLET | Freq: Four times a day (QID) | ORAL | Status: DC | PRN

## 2022-03-13 MED ORDER — METHOCARBAMOL 1000 MG/10ML IJ SOLN
500.0000 mg | Freq: Four times a day (QID) | INTRAVENOUS | Status: DC | PRN
  Administered 2022-03-13: 500 mg via INTRAVENOUS
  Filled 2022-03-13 (×3): qty 5

## 2022-03-13 MED ORDER — SODIUM CHLORIDE 0.9 % IV SOLN: INTRAVENOUS | Status: DC

## 2022-03-13 MED ORDER — LABETALOL HCL 5 MG/ML IV SOLN
INTRAVENOUS | Status: DC | PRN
  Administered 2022-03-13: 5 mg via INTRAVENOUS

## 2022-03-13 MED ORDER — ONDANSETRON HCL 4 MG/2ML IJ SOLN: 4.0000 mg | Freq: Four times a day (QID) | INTRAMUSCULAR | Status: DC | PRN

## 2022-03-13 MED ORDER — 0.9 % SODIUM CHLORIDE (POUR BTL) OPTIME
TOPICAL | Status: DC | PRN
  Administered 2022-03-13: 500 mL

## 2022-03-13 MED ORDER — MENTHOL 3 MG MT LOZG: 1.0000 | LOZENGE | OROMUCOSAL | Status: DC | PRN

## 2022-03-13 MED ORDER — METHYLPREDNISOLONE ACETATE 40 MG/ML IJ SUSP
INTRAMUSCULAR | Status: AC
  Filled 2022-03-13: qty 1

## 2022-03-13 MED ORDER — MIDAZOLAM HCL 2 MG/2ML IJ SOLN
INTRAMUSCULAR | Status: AC
  Filled 2022-03-13: qty 2

## 2022-03-13 MED ORDER — OXYCODONE HCL 5 MG PO TABS: 5.0000 mg | ORAL_TABLET | ORAL | Status: DC | PRN

## 2022-03-13 MED ORDER — SODIUM CHLORIDE 0.9 % IV SOLN: 250.0000 mL | INTRAVENOUS | Status: DC

## 2022-03-13 MED ORDER — FENTANYL CITRATE PF 50 MCG/ML IJ SOSY
PREFILLED_SYRINGE | INTRAMUSCULAR | Status: AC
  Administered 2022-03-13: 25 ug via INTRAVENOUS
  Filled 2022-03-13: qty 1

## 2022-03-13 MED ORDER — FENTANYL CITRATE (PF) 100 MCG/2ML IJ SOLN: 25.0000 ug | INTRAMUSCULAR | Status: DC | PRN

## 2022-03-13 MED ORDER — FENTANYL CITRATE PF 50 MCG/ML IJ SOSY
25.0000 ug | PREFILLED_SYRINGE | INTRAMUSCULAR | Status: DC | PRN
  Administered 2022-03-13: 25 ug via INTRAVENOUS

## 2022-03-13 MED ORDER — LORATADINE 10 MG PO TABS
ORAL_TABLET | ORAL | Status: AC
  Administered 2022-03-13: 10 mg via ORAL
  Filled 2022-03-13: qty 1

## 2022-03-13 MED ORDER — ENOXAPARIN SODIUM 60 MG/0.6ML IJ SOSY
0.5000 mg/kg | PREFILLED_SYRINGE | INTRAMUSCULAR | Status: DC
  Administered 2022-03-14: 55 mg via SUBCUTANEOUS
  Filled 2022-03-13: qty 0.6

## 2022-03-13 MED ORDER — SUGAMMADEX SODIUM 200 MG/2ML IV SOLN
INTRAVENOUS | Status: DC | PRN
  Administered 2022-03-13: 200 mg via INTRAVENOUS

## 2022-03-13 MED ORDER — ACETAMINOPHEN 500 MG PO TABS
ORAL_TABLET | ORAL | Status: AC
  Administered 2022-03-13: 1000 mg via ORAL
  Filled 2022-03-13: qty 2

## 2022-03-13 MED ORDER — KETOROLAC TROMETHAMINE 15 MG/ML IJ SOLN: 15.0000 mg | Freq: Four times a day (QID) | INTRAMUSCULAR | Status: DC

## 2022-03-13 MED ORDER — LORATADINE 10 MG PO TABS: 10.0000 mg | ORAL_TABLET | Freq: Every day | ORAL | Status: DC

## 2022-03-13 MED ORDER — FENTANYL CITRATE (PF) 100 MCG/2ML IJ SOLN
INTRAMUSCULAR | Status: AC
  Filled 2022-03-13: qty 2

## 2022-03-13 MED ORDER — LIDOCAINE HCL (CARDIAC) PF 100 MG/5ML IV SOSY
PREFILLED_SYRINGE | INTRAVENOUS | Status: DC | PRN
  Administered 2022-03-13: 100 mg via INTRAVENOUS

## 2022-03-13 MED ORDER — FENTANYL CITRATE (PF) 100 MCG/2ML IJ SOLN
INTRAMUSCULAR | Status: DC | PRN
  Administered 2022-03-13: 100 ug via INTRAVENOUS

## 2022-03-13 MED ORDER — DOCUSATE SODIUM 100 MG PO CAPS
ORAL_CAPSULE | ORAL | Status: AC
  Administered 2022-03-13: 100 mg via ORAL
  Filled 2022-03-13: qty 1

## 2022-03-13 MED ORDER — ROCURONIUM BROMIDE 100 MG/10ML IV SOLN
INTRAVENOUS | Status: DC | PRN
  Administered 2022-03-13: 50 mg via INTRAVENOUS
  Administered 2022-03-13: 30 mg via INTRAVENOUS

## 2022-03-13 MED ORDER — METOPROLOL TARTRATE 25 MG PO TABS
ORAL_TABLET | ORAL | Status: AC
  Administered 2022-03-13: 50 mg via ORAL
  Filled 2022-03-13: qty 2

## 2022-03-13 MED ORDER — DROPERIDOL 2.5 MG/ML IJ SOLN: 0.6250 mg | Freq: Once | INTRAMUSCULAR | Status: DC | PRN

## 2022-03-13 MED ORDER — CHLORHEXIDINE GLUCONATE 0.12 % MT SOLN
OROMUCOSAL | Status: AC
  Filled 2022-03-13: qty 15

## 2022-03-13 MED ORDER — SODIUM CHLORIDE 0.9% FLUSH: 3.0000 mL | INTRAVENOUS | Status: DC | PRN

## 2022-03-13 MED ORDER — LAMOTRIGINE 100 MG PO TABS
100.0000 mg | ORAL_TABLET | Freq: Every day | ORAL | Status: DC
  Administered 2022-03-14: 100 mg via ORAL
  Filled 2022-03-13: qty 1

## 2022-03-13 MED ORDER — POLYETHYLENE GLYCOL 3350 17 G PO PACK: 17.0000 g | PACK | Freq: Every day | ORAL | Status: DC | PRN

## 2022-03-13 MED ORDER — EPINEPHRINE PF 1 MG/ML IJ SOLN
INTRAMUSCULAR | Status: AC
  Filled 2022-03-13: qty 1

## 2022-03-13 MED ORDER — ACETAMINOPHEN 500 MG PO TABS: 1000.0000 mg | ORAL_TABLET | Freq: Four times a day (QID) | ORAL | Status: DC

## 2022-03-13 MED ORDER — METHYLPREDNISOLONE ACETATE 40 MG/ML IJ SUSP
INTRAMUSCULAR | Status: DC | PRN
  Administered 2022-03-13: 40 mg

## 2022-03-13 MED ORDER — ACETAMINOPHEN 10 MG/ML IV SOLN: 1000.0000 mg | Freq: Once | INTRAVENOUS | Status: DC | PRN

## 2022-03-13 MED ORDER — OXYCODONE HCL 5 MG PO TABS: 10.0000 mg | ORAL_TABLET | ORAL | Status: DC | PRN

## 2022-03-13 MED ORDER — OXYCODONE HCL 5 MG PO TABS: 5.0000 mg | ORAL_TABLET | Freq: Once | ORAL | Status: DC | PRN

## 2022-03-13 MED ORDER — SODIUM CHLORIDE 0.9% FLUSH: 3.0000 mL | Freq: Two times a day (BID) | INTRAVENOUS | Status: DC

## 2022-03-13 MED ORDER — ONDANSETRON HCL 4 MG/2ML IJ SOLN
INTRAMUSCULAR | Status: DC | PRN
  Administered 2022-03-13: 4 mg via INTRAVENOUS

## 2022-03-13 MED ORDER — BUPIVACAINE LIPOSOME 1.3 % IJ SUSP
INTRAMUSCULAR | Status: AC
  Filled 2022-03-13: qty 20

## 2022-03-13 MED ORDER — ACETAMINOPHEN 500 MG PO TABS: 1000.0000 mg | ORAL_TABLET | Freq: Once | ORAL | Status: AC

## 2022-03-13 MED ORDER — SODIUM CHLORIDE FLUSH 0.9 % IV SOLN
INTRAVENOUS | Status: AC
  Filled 2022-03-13: qty 20

## 2022-03-13 SURGICAL SUPPLY — 48 items
BASIN KIT SINGLE STR (MISCELLANEOUS) ×1 IMPLANT
BUR NEURO DRILL SOFT 3.0X3.8M (BURR) ×1 IMPLANT
CHLORAPREP W/TINT 26 (MISCELLANEOUS) ×1 IMPLANT
CNTNR URN SCR LID CUP LEK RST (MISCELLANEOUS) ×1 IMPLANT
CONT SPEC 4OZ STRL OR WHT (MISCELLANEOUS) ×2
DERMABOND ADVANCED .7 DNX12 (GAUZE/BANDAGES/DRESSINGS) IMPLANT
DRAPE C ARM PK CFD 31 SPINE (DRAPES) ×1 IMPLANT
DRAPE LAPAROTOMY 100X77 ABD (DRAPES) ×1 IMPLANT
DRAPE MICROSCOPE SPINE 48X150 (DRAPES) ×1 IMPLANT
DRAPE SURG 17X11 SM STRL (DRAPES) ×1 IMPLANT
DRSG OPSITE POSTOP 3X4 (GAUZE/BANDAGES/DRESSINGS) IMPLANT
DRSG TEGADERM 2-3/8X2-3/4 SM (GAUZE/BANDAGES/DRESSINGS) IMPLANT
ELECT EZSTD 165MM 6.5IN (MISCELLANEOUS)
ELECT REM PT RETURN 9FT ADLT (ELECTROSURGICAL) ×1
ELECTRODE EZSTD 165MM 6.5IN (MISCELLANEOUS) IMPLANT
ELECTRODE REM PT RTRN 9FT ADLT (ELECTROSURGICAL) ×1 IMPLANT
GLOVE BIOGEL PI IND STRL 8.5 (GLOVE) ×1 IMPLANT
GLOVE SURG SYN 6.5 ES PF (GLOVE) ×2 IMPLANT
GLOVE SURG SYN 6.5 PF PI (GLOVE) ×2 IMPLANT
GLOVE SURG SYN 8.5  E (GLOVE) ×3
GLOVE SURG SYN 8.5 E (GLOVE) ×3 IMPLANT
GLOVE SURG SYN 8.5 PF PI (GLOVE) ×3 IMPLANT
GLOVE SURG UNDER POLY LF SZ6.5 (GLOVE) ×1 IMPLANT
GLOVE SURG UNDER POLY LF SZ8.5 (GLOVE) ×1 IMPLANT
GOWN SRG LRG LVL 4 IMPRV REINF (GOWNS) ×1 IMPLANT
GOWN SRG XL LVL 3 NONREINFORCE (GOWNS) ×1 IMPLANT
GOWN STRL NON-REIN TWL XL LVL3 (GOWNS) ×1
GOWN STRL REIN LRG LVL4 (GOWNS) ×1
HEMOVAC 400CC 10FR (MISCELLANEOUS) IMPLANT
KIT SPINAL PRONEVIEW (KITS) ×1 IMPLANT
MANIFOLD NEPTUNE II (INSTRUMENTS) ×1 IMPLANT
MARKER SKIN DUAL TIP RULER LAB (MISCELLANEOUS) ×1 IMPLANT
NDL SAFETY ECLIP 18X1.5 (MISCELLANEOUS) ×1 IMPLANT
NS IRRIG 1000ML POUR BTL (IV SOLUTION) ×1 IMPLANT
PACK LAMINECTOMY NEURO (CUSTOM PROCEDURE TRAY) ×1 IMPLANT
PAD ARMBOARD 7.5X6 YLW CONV (MISCELLANEOUS) ×1 IMPLANT
SPONGE GAUZE 2X2 8PLY STRL LF (GAUZE/BANDAGES/DRESSINGS) IMPLANT
SURGIFLO W/THROMBIN 8M KIT (HEMOSTASIS) ×1 IMPLANT
SUT DVC VLOC 3-0 CL 6 P-12 (SUTURE) ×1 IMPLANT
SUT ETHILON 2 0 FS 18 (SUTURE) IMPLANT
SUT VIC AB 0 CT1 27 (SUTURE) ×1
SUT VIC AB 0 CT1 27XCR 8 STRN (SUTURE) ×1 IMPLANT
SUT VIC AB 2-0 CT1 18 (SUTURE) ×1 IMPLANT
SYR 10ML LL (SYRINGE) ×2 IMPLANT
SYR 30ML LL (SYRINGE) ×2 IMPLANT
SYR 3ML LL SCALE MARK (SYRINGE) ×1 IMPLANT
TRAP FLUID SMOKE EVACUATOR (MISCELLANEOUS) ×1 IMPLANT
WATER STERILE IRR 1000ML POUR (IV SOLUTION) ×2 IMPLANT

## 2022-03-13 NOTE — Interval H&P Note (Signed)
History and Physical Interval Note:  03/13/2022 4:00 PM  Toru Saxman [employer] [facility] [employer] [facility]  has presented today for surgery, with the diagnosis of cauda equina syndrome.  The various methods of treatment have been discussed with the patient and family. After consideration of risks, benefits and other options for treatment, the patient has consented to  Procedure(s) with comments: LUMBAR LAMINECTOMY/ DECOMPRESSION WITH MET-RX (Left) - lumbar laminectomy L2-3 for resection of epidural lesion as a surgical intervention.  The patient's history has been reviewed, patient examined, no change in status, stable for surgery.  I have reviewed the patient's chart and labs.  Questions were answered to the patient's satisfaction.     Labella Zahradnik

## 2022-03-13 NOTE — Consult Note (Signed)
Consult requested by:  Dr. Cherylann Cisneros  Consult requested for:  Lower extremity weakness  Primary Physician:  Rodney Aus, MD  History of Present Illness: 03/13/2022 [name] is here today with a chief complaint of lower extremity weakness over the last couple of weeks.  He has severe pain worse on the right than the left but encompassing both legs.  He has numbness in his anterior thighs medially.  He reports increasing difficulty with urination though he is still is able to urinate.  He has been able to walk but has trouble due to weakness.  He feels as though his legs are going to buckle.  He was seen in the physiatry clinic today where his lower extremity weakness was noted and he was sent to the emergency department for evaluation.  Past Surgery: Left L5-S1 microdiscectomy in the remote past  Rodney Cisneros [facility] has no symptoms of cervical myelopathy.  The symptoms are causing a significant impact on the patient's life.   I have utilized the care everywhere function in epic to review the outside records available from external health systems.  Review of Systems:  A 10 point review of systems is negative, except for the pertinent positives and negatives detailed in the HPI.  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   Colitis    Depression    Diverticulosis    Epididymitis    GERD (gastroesophageal reflux disease)    Hypertension    Prostatitis    Rhinitis, allergic    Tubular adenoma 2017    Past Surgical History: Past Surgical History:  Procedure Laterality Date   BACK SURGERY  1991   laminectomy-L5   CHOLECYSTECTOMY N/A 09/23/2017   Procedure: LAPAROSCOPIC CHOLECYSTECTOMY;  Surgeon: Herbert Pun, MD;  Location: [facility] ORS;  Service: General;  Laterality: N/A;   COLONOSCOPY     2016 and 2017   edg  2008   FUNCTIONAL ENDOSCOPIC SINUS SURGERY      TONSILLECTOMY      Allergies: Allergies as of 03/13/2022 - Review Complete 03/13/2022  Allergen Reaction Noted   Sulfa antibiotics Hives 10/01/2015    Medications: Current Meds  Medication Sig   colchicine 0.6 MG tablet Take 0.6 mg by mouth 2 (two) times daily as needed.   lamoTRIgine (LAMICTAL) 100 MG tablet Take 100 mg by mouth daily.   lisinopril (ZESTRIL) 20 MG tablet Take 20 mg by mouth daily.   oxyCODONE-acetaminophen (PERCOCET/ROXICET) 5-325 MG tablet Take 1 tablet by mouth every 6 (six) hours as needed.   predniSONE (DELTASONE) 10 MG tablet Take by mouth. Does pak    Social History: Social History   Tobacco Use   Smoking status: Former   Smokeless tobacco: Never  Scientific laboratory technician Use: Never used  Substance Use Topics   Alcohol use: Yes    Comment: occasionally   Drug use: Never    Family Medical History: No family history on file.  Physical Examination: Vitals:   03/13/22 1100 03/13/22 1130  BP: (!) 157/94 (!) 164/91  Pulse: (!) 112 (!) 115  Resp: (!) 26 (!) 21  Temp:    SpO2: 92% 93%   Heart sounds normal no MRG. Chest Clear to Auscultation Bilaterally.  General: Patient is well developed, well nourished, calm, collected, and in moderate apparent distress. Attention to examination is appropriate.  Neck:   Supple.  Full range of motion.  Respiratory: Patient is breathing without any difficulty.  +SLR  on right leg   NEUROLOGICAL:     Awake, alert, oriented to person, place, and time.  Speech is clear and fluent.  Cranial Nerves: Pupils equal round and reactive to light.  Facial tone is symmetric.  Facial sensation is symmetric. Shoulder shrug is symmetric. Tongue protrusion is midline.  There is no pronator drift.  Strength: Side Biceps Triceps Deltoid Interossei Grip Wrist Ext. Wrist Flex.  R '5 5 5 5 5 5 5  '$ L '5 5 5 5 5 5 5   '$ Side Iliopsoas Quads Hamstring PF DF EHL  R 5 4- 4- 4- 4- 5  L 5 4- 4- 4- 4- 5   Reflexes are 1+ and symmetric  at the biceps, triceps, brachioradialis, patella and achilles.   Hoffman's is absent.   Bilateral upper and lower extremity sensation is intact to light touch.    No evidence of dysmetria noted.  Gait is untested due to lower extremity weakness.     Medical Decision Making  Imaging: MRI L spine 03/09/2022 IMPRESSION: 1. The primary abnormality is at L2-3 where there is a posterior epidural collection on the left causing significant mass effect on the thecal sac, likely symptomatic. While this could reflect an atypical synovial cyst, no underlying significant facet arthropathy is apparent at this level. Given the patient's recent fall and broad-based left foraminal and extraforaminal disc protrusion, this may reflect subacute epidural hemorrhage. The disc protrusion narrows the left foramen and may contribute to left L2 nerve root encroachment. 2. No other acute disc space findings. 3. 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 4. Chronic moderate osseous foraminal narrowing on the left at L5-S1 due to disc bulging, endplate osteophytes and facet hypertrophy. 5. Congenitally short pedicles.  No evidence of acute fracture     Electronically Signed   By: Richardean Sale M.D.   On: 03/11/2022 13:28  I have personally reviewed the images and agree with the above interpretation.  Assessment and Plan: [name] is a pleasant 57 y.o. male with symptoms of cauda equina compression due to severe epidural compression at L2-3.  This could be due to an epidural hematoma versus synovial cyst.  Given his worsening symptoms and urinary abnormalities, I am concerned that this represents impending cauda equina syndrome.  There is no role for conservative management of cauda equina syndrome, so I recommended surgical intervention with L2-3 decompression and resection of the epidural component of the lesion compressing his spinal cord.  I discussed the planned procedure at length with  the patient, including the risks, benefits, alternatives, and indications. The risks discussed include but are not limited to bleeding, infection, need for reoperation, spinal fluid leak, stroke, vision loss, anesthetic complication, coma, paralysis, and even death. I also described in detail that improvement was not guaranteed.  The patient expressed understanding of these risks, and asked that we proceed with surgery. I described the surgery in layman's terms, and gave ample opportunity for questions, which were answered to the best of my ability.     I have communicated my recommendations to the requesting physician and coordinated care to facilitate these recommendations.     Akiyah Eppolito K. Izora Ribas MD, [facility] Neurosurgery

## 2022-03-13 NOTE — Anesthesia Preprocedure Evaluation (Signed)
Anesthesia Evaluation  Patient identified by MRN, date of birth, ID band Patient awake    Reviewed: Allergy & Precautions, NPO status , Patient's Chart, lab work & pertinent test results  History of Anesthesia Complications Negative for: history of anesthetic complications  Airway Mallampati: III  TM Distance: <3 FB Neck ROM: full    Dental  (+) Chipped   Pulmonary neg shortness of breath, former smoker   Pulmonary exam normal        Cardiovascular Exercise Tolerance: Good hypertension, (-) angina (-) Past MI Normal cardiovascular exam     Neuro/Psych  PSYCHIATRIC DISORDERS       Neuromuscular disease    GI/Hepatic Neg liver ROS, PUD,GERD  Controlled,,  Endo/Other  negative endocrine ROS    Renal/GU      Musculoskeletal   Abdominal   Peds  Hematology negative hematology ROS (+)   Anesthesia Other Findings Past Medical History: No date: Anxiety 2016: Chronic ulcerative rectosigmoiditis, with rectal bleeding ([facility]) No date: Colitis No date: Depression No date: Diverticulosis No date: Epididymitis No date: GERD (gastroesophageal reflux disease) No date: Hypertension No date: Prostatitis No date: Rhinitis, allergic 2017: Tubular adenoma  Past Surgical History: 1991: BACK SURGERY     Comment:  laminectomy-L5 09/23/2017: CHOLECYSTECTOMY; N/A     Comment:  Procedure: LAPAROSCOPIC CHOLECYSTECTOMY;  Surgeon:               Herbert Pun, MD;  Location: [facility] ORS;  Service:              General;  Laterality: N/A; No date: COLONOSCOPY     Comment:  2016 and 2017 2008: edg No date: FUNCTIONAL ENDOSCOPIC SINUS SURGERY No date: TONSILLECTOMY  BMI    Body Mass Index: 33.23 kg/m      Reproductive/Obstetrics negative OB ROS                             Anesthesia Physical Anesthesia Plan  ASA: 3  Anesthesia Plan: General ETT   Post-op Pain Management:    Induction:  Intravenous  PONV Risk Score and Plan: Ondansetron, Dexamethasone, Midazolam and Treatment may vary due to age or medical condition  Airway Management Planned: Oral ETT  Additional Equipment:   Intra-op Plan:   Post-operative Plan: Extubation in OR  Informed Consent: I have reviewed the patients History and Physical, chart, labs and discussed the procedure including the risks, benefits and alternatives for the proposed anesthesia with the patient or authorized representative who has indicated his/her understanding and acceptance.     Dental Advisory Given  Plan Discussed with: Anesthesiologist, CRNA and Surgeon  Anesthesia Plan Comments: (Patient consented for risks of anesthesia including but not limited to:  - adverse reactions to medications - damage to eyes, teeth, lips or other oral mucosa - nerve damage due to positioning  - sore throat or hoarseness - Damage to heart, brain, nerves, lungs, other parts of body or loss of life  Patient voiced understanding.)       Anesthesia Quick Evaluation

## 2022-03-13 NOTE — ED Notes (Signed)
Pt given ice chips with verbal ok by MD

## 2022-03-13 NOTE — Progress Notes (Signed)
PHARMACIST - PHYSICIAN COMMUNICATION  CONCERNING:  Enoxaparin (Lovenox) for DVT Prophylaxis    RECOMMENDATION: Patient was prescribed enoxaprin '40mg'$  q24 hours for VTE prophylaxis.   Filed Weights   03/13/22 1516  Weight: 111.1 kg (245 lb)    Body mass index is 33.23 kg/m.  Estimated Creatinine Clearance: 106.8 mL/min (by C-G formula based on SCr of 0.97 mg/dL).   Based on [place] patient is candidate for enoxaparin 0.'5mg'$ /kg TBW SQ every 24 hours based on BMI being >30.   DESCRIPTION: Pharmacy has adjusted enoxaparin dose per [facility] policy.  Patient is now receiving enoxaparin 55 mg every 24 hours    Alison Murray, PharmD Clinical Pharmacist  03/13/2022 8:32 PM

## 2022-03-13 NOTE — ED Provider Notes (Signed)
[facility] Provider Note    Event Date/Time   First MD Initiated Contact with Patient 03/13/22 [phone]     (approximate)   History   Back Pain   HPI  Rodney Cisneros [facility] is a 57 y.o. male with a history of hypertension and GERD who presents with persistent low back pain for the last few weeks after a fall.  The pain is across his lower back and radiates down to his legs.  The patient has tingling and numbness in both legs.  He states he has worsened pain with walking.  He reports some constipation but has not had any fecal incontinence.  He denies any urinary symptoms.  He states that he had an MRI and was instructed to come to the ED due to potential bleeding on the spine.  I reviewed the past medical records.  The patient was seen in the ED on 6 2/6 for back pain and was treated symptomatically.  He was seen in physical medicine at [facility].  MRI performed on 2/25 shows possible subacute epidural hemorrhage.   Physical Exam   Triage Vital Signs: ED Triage Vitals  Enc Vitals Group     BP 03/13/22 0859 (!) 158/98     Pulse Rate 03/13/22 0859 (!) 107     Resp 03/13/22 0859 20     Temp 03/13/22 0859 98 F (36.7 C)     Temp src --      SpO2 03/13/22 0859 93 %     Weight --      Height --      Head Circumference --      Peak Flow --      Pain Score 03/13/22 0856 10     Pain Loc --      Pain Edu? --      Excl. in [place]? --     Most recent vital signs: Vitals:   03/13/22 1251 03/13/22 1516  BP:  138/89  Pulse:  98  Resp:  20  Temp: 98 F (36.7 C) 97.9 F (36.6 C)  SpO2:  94%     General: Awake, no distress.  CV:  Good peripheral perfusion.  Resp:  Normal effort.  Abd:  No distention.  Other:  5/5 motor strength and intact sensation to bilateral lower extremities.   ED Results / Procedures / Treatments   Labs (all labs ordered are listed, but only abnormal results are displayed) Labs Reviewed  BASIC METABOLIC PANEL - Abnormal;  Notable for the following components:      Result Value   Sodium 134 (*)    Glucose, Bld 105 (*)    BUN 21 (*)    All other components within normal limits  CBC WITH DIFFERENTIAL/PLATELET  PROTIME-INR  TYPE AND SCREEN     EKG     RADIOLOGY     PROCEDURES:  Critical Care performed: No  Procedures   MEDICATIONS ORDERED IN ED: Medications  chlorhexidine (PERIDEX) 0.12 % solution (has no administration in time range)  fentaNYL (SUBLIMAZE) injection 25 mcg (25 mcg Intravenous Given 03/13/22 1527)  HYDROmorphone (DILAUDID) injection 1 mg (1 mg Intravenous Given 03/13/22 0935)  HYDROmorphone (DILAUDID) injection 1 mg (1 mg Intravenous Given 03/13/22 1227)  acetaminophen (TYLENOL) tablet 1,000 mg (1,000 mg Oral Given 03/13/22 1529)     IMPRESSION / MDM / ASSESSMENT AND PLAN / ED COURSE  I reviewed the triage vital signs and the nursing notes.  57 year old male with PMH as noted  above presents with persistent back pain after a fall a few weeks ago with MRI several days ago showing concern for epidural hematoma.  On exam currently, the patient has normal strength of bilateral lower extremities.  MRI report is as follows:  1. The primary abnormality is at L2-3 where there is a posterior epidural collection on the left causing significant mass effect on the thecal sac, likely symptomatic. While this could reflect an atypical synovial cyst, no underlying significant facet arthropathy is apparent at this level. Given the patient's recent fall and broad-based left foraminal and extraforaminal disc protrusion, this may reflect subacute epidural hemorrhage. The disc protrusion narrows the left foramen and may contribute to left L2 nerve root encroachment. 2. No other acute disc space findings. 3. 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 4. Chronic moderate osseous foraminal narrowing on the left at L5-S1 due to disc bulging, endplate osteophytes and facet  hypertrophy. 5. Congenitally short pedicles. No evidence of acute fracture    Differential diagnosis includes, but is not limited to, epidural hematoma, synovial cyst.  We will obtain lab workup and I will consult neurosurgery.  Patient's presentation is most consistent with acute presentation with potential threat to life or bodily function.  ----------------------------------------- 12:11 PM on 03/13/2022 -----------------------------------------  I consulted and discussed the case with Dr. Cari Caraway from neurosurgery who came to evaluate the patient in the ED and will admit him to the OR for surgery today.  Lab workup is unremarkable.  There is no leukocytosis and electrolytes are normal.  FINAL CLINICAL IMPRESSION(S) / ED DIAGNOSES   Final diagnoses:  Epidural hematoma ([place])     Rx / DC Orders   ED Discharge Orders     None        Note:  This document was prepared using Dragon voice recognition software and may include unintentional dictation errors.    Arta Silence, MD 03/13/22 [phone]

## 2022-03-13 NOTE — ED Notes (Addendum)
Patient sent to ED from [facility] for known L2 and L3 narrowing with herniation. He has been experiencing a extremity weakness and changes in the bowel and bladder.

## 2022-03-13 NOTE — H&P (View-Only) (Signed)
Consult requested by:  Dr. Cherylann Cisneros  Consult requested for:  Lower extremity weakness  Primary Physician:  Rodney Aus, MD  History of Present Illness: 03/13/2022 [name] is here today with a chief complaint of lower extremity weakness over the last couple of weeks.  He has severe pain worse on the right than the left but encompassing both legs.  He has numbness in his anterior thighs medially.  He reports increasing difficulty with urination though he is still is able to urinate.  He has been able to walk but has trouble due to weakness.  He feels as though his legs are going to buckle.  He was seen in the physiatry clinic today where his lower extremity weakness was noted and he was sent to the emergency department for evaluation.  Past Surgery: Left L5-S1 microdiscectomy in the remote past  [facility]ney Cis[facility] has no symptoms of cervical myelopathy.  The symptoms are causing a significant impact on the patient's life.   I have utilized the care everywhere function in epic to review the outside records available from external health systems.  Review of Systems:  A 10 point review of systems is negative, except for the pertinent positives and negatives detailed in the HPI.  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   Colitis    Depression    Diverticulosis    Epididymitis    GERD (gastroesophageal reflux disease)    Hypertension    Prostatitis    Rhinitis, allergic    Tubular adenoma 2017    Past Surgical History: Past Surgical History:  Procedure Laterality Date   BACK SURGERY  1991   laminectomy-L5   CHOLECYSTECTOMY N/A 09/23/2017   Procedure: LAPAROSCOPIC CHOLECYSTECTOMY;  Surgeon: Herbert Pun, MD;  Location: [facility] ORS;  Service: General;  Laterality: N/A;   COLONOSCOPY     2016 and 2017   edg  2008   FUNCTIONAL ENDOSCOPIC SINUS SURGERY      TONSILLECTOMY      Allergies: Allergies as of 03/13/2022 - Review Complete 03/13/2022  Allergen Reaction Noted   Sulfa antibiotics Hives 10/01/2015    Medications: Current Meds  Medication Sig   colchicine 0.6 MG tablet Take 0.6 mg by mouth 2 (two) times daily as needed.   lamoTRIgine (LAMICTAL) 100 MG tablet Take 100 mg by mouth daily.   lisinopril (ZESTRIL) 20 MG tablet Take 20 mg by mouth daily.   oxyCODONE-acetaminophen (PERCOCET/ROXICET) 5-325 MG tablet Take 1 tablet by mouth every 6 (six) hours as needed.   predniSONE (DELTASONE) 10 MG tablet Take by mouth. Does pak    Social History: Social History   Tobacco Use   Smoking status: Former   Smokeless tobacco: Never  Scientific laboratory technician Use: Never used  Substance Use Topics   Alcohol use: Yes    Comment: occasionally   Drug use: Never    Family Medical History: No family history on file.  Physical Examination: Vitals:   03/13/22 1100 03/13/22 1130  BP: (!) 157/94 (!) 164/91  Pulse: (!) 112 (!) 115  Resp: (!) 26 (!) 21  Temp:    SpO2: 92% 93%   Heart sounds normal no MRG. Chest Clear to Auscultation Bilaterally.  General: Patient is well developed, well nourished, calm, collected, and in moderate apparent distress. Attention to examination is appropriate.  Neck:   Supple.  Full range of motion.  Respiratory: Patient is breathing without any difficulty.  +SLR  on right leg   NEUROLOGICAL:     Awake, alert, oriented to person, place, and time.  Speech is clear and fluent.  Cranial Nerves: Pupils equal round and reactive to light.  Facial tone is symmetric.  Facial sensation is symmetric. Shoulder shrug is symmetric. Tongue protrusion is midline.  There is no pronator drift.  Strength: Side Biceps Triceps Deltoid Interossei Grip Wrist Ext. Wrist Flex.  R '5 5 5 5 5 5 5  '$ L '5 5 5 5 5 5 5   '$ Side Iliopsoas Quads Hamstring PF DF EHL  R 5 4- 4- 4- 4- 5  L 5 4- 4- 4- 4- 5   Reflexes are 1+ and symmetric  at the biceps, triceps, brachioradialis, patella and achilles.   Hoffman's is absent.   Bilateral upper and lower extremity sensation is intact to light touch.    No evidence of dysmetria noted.  Gait is untested due to lower extremity weakness.     Medical Decision Making  Imaging: MRI L spine 03/09/2022 IMPRESSION: 1. The primary abnormality is at L2-3 where there is a posterior epidural collection on the left causing significant mass effect on the thecal sac, likely symptomatic. While this could reflect an atypical synovial cyst, no underlying significant facet arthropathy is apparent at this level. Given the patient's recent fall and broad-based left foraminal and extraforaminal disc protrusion, this may reflect subacute epidural hemorrhage. The disc protrusion narrows the left foramen and may contribute to left L2 nerve root encroachment. 2. No other acute disc space findings. 3. 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 4. Chronic moderate osseous foraminal narrowing on the left at L5-S1 due to disc bulging, endplate osteophytes and facet hypertrophy. 5. Congenitally short pedicles.  No evidence of acute fracture     Electronically Signed   By: Rodney Cisneros M.D.   On: 03/11/2022 13:28  I have personally reviewed the images and agree with the above interpretation.  Assessment and Plan: Rodney Cisneros is a pleasant 57 y.o. male with symptoms of cauda equina compression due to severe epidural compression at L2-3.  This could be due to an epidural hematoma versus synovial cyst.  Given his worsening symptoms and urinary abnormalities, I am concerned that this represents impending cauda equina syndrome.  There is no role for conservative management of cauda equina syndrome, so I recommended surgical intervention with L2-3 decompression and resection of the epidural component of the lesion compressing his spinal cord.  I discussed the planned procedure at length with  the patient, including the risks, benefits, alternatives, and indications. The risks discussed include but are not limited to bleeding, infection, need for reoperation, spinal fluid leak, stroke, vision loss, anesthetic complication, coma, paralysis, and even death. I also described in detail that improvement was not guaranteed.  The patient expressed understanding of these risks, and asked that we proceed with surgery. I described the surgery in layman's terms, and gave ample opportunity for questions, which were answered to the best of my ability.     I have communicated my recommendations to the requesting physician and coordinated care to facilitate these recommendations.     Voris Tigert K. Izora Ribas MD, [facility] Neurosurgery

## 2022-03-13 NOTE — Op Note (Signed)
Indications: [name] Cisneros is suffering from lumbar stenosis due to presented of a lumbar spinal epidural mass.  He presented with symptoms of cauda equina syndrome.  Due to severe compression of his cauda equina and his symptoms, surgical intervention was indicated.  Findings: epidural lesion compression the lumbar spine  Preoperative Diagnosis: Lumbar spinal mass, cauda equina syndrome Postoperative Diagnosis: same   EBL: 25 ml IVF: see AR ml Drains: 1 placed Disposition: Extubated and Stable to PACU Complications: none  No foley catheter was placed.   Preoperative Note:   Risks of surgery discussed include: infection, bleeding, stroke, coma, death, paralysis, CSF leak, nerve/spinal cord injury, numbness, tingling, weakness, complex regional pain syndrome, recurrent stenosis and/or disc herniation, vascular injury, development of instability, neck/back pain, need for further surgery, persistent symptoms, development of deformity, and the risks of anesthesia. The patient understood these risks and agreed to proceed.  Operative Note:   1. L2-3 lumbar laminectomy for resection of spinal mass, including use of microscope  The patient was then brought from the preoperative center with intravenous access established.  The patient underwent general anesthesia and endotracheal tube intubation, and was then rotated on the [place] rail top where all pressure points were appropriately padded.  The skin was then thoroughly cleansed.  Perioperative antibiotic prophylaxis was administered.  Sterile prep and drapes were then applied and a timeout was then observed.  C-arm was brought into the field under sterile conditions and under lateral visualization the L2-3 interspace was identified and marked.  The incision was marked on the left and injected with local anesthetic. Once this was complete a 3 cm incision was opened with the use of a #10 blade knife.    The metrx tubes were sequentially  advanced and confirmed in position at L2-3. An 72m by 553mtube was locked in place to the bed side attachment.  The microscope was then sterilely brought into the field and muscle creep was hemostased with a bipolar and resected with a pituitary rongeur.  A Bovie extender was then used to expose the spinous process and lamina.  Careful attention was placed to not violate the facet capsule. A 3 mm matchstick drill bit was then used to make a hemi-laminotomy trough until the ligamentum flavum was exposed.  This was extended to the base of the spinous process and to the contralateral side to remove all the central bone from each side.  Once this was complete and the underlying ligamentum flavum was visualized, it was dissected with a curette and resected with Kerrison rongeurs.    A large epidural lesion was identified.  This was dissected free from the dura and resected in piecemeal fashion.  The lesion did not appear to be a cancerous lesion, but was sent off to pathology for definitive testing.  The vast majority of the lesion was removed until the left L3 nerve root and dura could be identified.  The dura was identified and palpated. The kerrison rongeur was then used to remove the medial facet bilaterally until no compression was noted.  A balltip probe was used to confirm decompression of the ipsilateral L3 nerve root.  nce this was complete, L2-3 central decompression including medial facetectomy and foraminotomy was confirmed and decompression on both sides was confirmed. No CSF leak was noted.  A Depo-Medrol soaked Gelfoam pledget was placed in the defect.  The wound was copiously irrigated. The tube system was then removed under microscopic visualization and hemostasis was obtained with a bipolar.  A  drain was placed.  The fascial layer was reapproximated with the use of a 0 Vicryl suture.  Subcutaneous tissue layer was reapproximated using 2-0 Vicryl suture.  3-0 monocryl was placed in  subcuticular fashion. The skin was then cleansed and Dermabond was used to close the skin opening.  Patient was then rotated back to the preoperative bed awakened from anesthesia and taken to recovery all counts are correct in this case.  I performed the entire procedure.   Jovan Schickling K. Izora Ribas MD

## 2022-03-13 NOTE — ED Notes (Signed)
Neuro at bedside.

## 2022-03-13 NOTE — ED Notes (Signed)
Pt assisted to recliner with pillow for comfort. Monitoring cords in place. Pt given remote. Pt call bell in reach. Pt denies any further needs at this time.

## 2022-03-13 NOTE — Anesthesia Postprocedure Evaluation (Signed)
Anesthesia Post Note  Patient: Rodney Cisneros  Procedure(s) Performed: LUMBAR LAMINECTOMY/ DECOMPRESSION WITH MET-RX (Left)  Patient location during evaluation: PACU Anesthesia Type: General Level of consciousness: awake and alert Pain management: pain level controlled Vital Signs Assessment: post-procedure vital signs reviewed and stable Respiratory status: spontaneous breathing, nonlabored ventilation, respiratory function stable and patient connected to nasal cannula oxygen Cardiovascular status: blood pressure returned to baseline and stable Postop Assessment: no apparent nausea or vomiting Anesthetic complications: no   No notable events documented.   Last Vitals:  Vitals:   03/13/22 1915 03/13/22 1932  BP: 124/85 138/70  Pulse: (!) 112 (!) 113  Resp: 13 18  Temp: 36.5 C   SpO2: 93% 95%    Last Pain:  Vitals:   03/13/22 1932  TempSrc:   PainSc: [name]

## 2022-03-13 NOTE — ED Notes (Signed)
Recollect type and screen walked to lab

## 2022-03-13 NOTE — ED Notes (Signed)
Lab called for type and screen recollect

## 2022-03-13 NOTE — ED Triage Notes (Signed)
Pt comes with c/o fall three weeks ago injuring his lower back. Pt states he injured his L3 and L2. Pt states problem when trying to have BM. Pt states he can't sit, stand put any pressure on it. Pt states he hasn't been able to sleep.  Pt went to Orthopedics today for appt and they told pt to come to ED for surgery due to blood in spine. Pt states some deterioration on some of disc. Pt also states decreased strength in legs.

## 2022-03-13 NOTE — Transfer of Care (Signed)
Immediate Anesthesia Transfer of Care Note  Patient: Rodney Cisneros  Procedure(s) Performed: LUMBAR LAMINECTOMY/ DECOMPRESSION WITH MET-RX (Left)  Patient Location: PACU  Anesthesia Type:General  Level of Consciousness: drowsy  Airway & Oxygen Therapy: Patient Spontanous Breathing and Patient connected to face mask oxygen  Post-op Assessment: Report given to RN and Post -op Vital signs reviewed and stable  Post vital signs: Reviewed  Last Vitals:  Vitals Value Taken Time  BP 141/73 03/13/22 1809  Temp 36.1 C 03/13/22 1809  Pulse 102 03/13/22 1812  Resp 12 03/13/22 1812  SpO2 98 % 03/13/22 1812  Vitals shown include unvalidated device data.  Last Pain:  Vitals:   03/13/22 1809  TempSrc:   PainSc: 0-No pain         Complications: No notable events documented.

## 2022-03-13 NOTE — Anesthesia Procedure Notes (Signed)
Procedure Name: Intubation Date/Time: 03/13/2022 4:17 PM  Performed by: Patience Musca., CRNAPre-anesthesia Checklist: Patient identified, Patient being monitored, Timeout performed, Emergency Drugs available and Suction available Patient Re-evaluated:Patient Re-evaluated prior to induction Oxygen Delivery Method: Circle system utilized Preoxygenation: Pre-oxygenation with 100% oxygen Induction Type: IV induction Ventilation: Oral airway inserted - appropriate to patient size and Mask ventilation without difficulty Laryngoscope Size: Mac and 4 Grade View: Grade I Tube type: Oral Tube size: 7.5 mm Number of attempts: 1 Airway Equipment and Method: Stylet Placement Confirmation: ETT inserted through vocal cords under direct vision, positive ETCO2 and breath sounds checked- equal and bilateral Secured at: 22 cm Tube secured with: Tape Dental Injury: Teeth and Oropharynx as per pre-operative assessment

## 2022-03-14 ENCOUNTER — Encounter: Payer: Self-pay | Admitting: Neurosurgery

## 2022-03-14 MED ORDER — METHOCARBAMOL 500 MG PO TABS: 500.0000 mg | ORAL_TABLET | Freq: Four times a day (QID) | ORAL | 0 refills | Status: DC | PRN

## 2022-03-14 MED ORDER — OXYCODONE HCL 5 MG PO TABS: 5.0000 mg | ORAL_TABLET | ORAL | 0 refills | Status: DC | PRN

## 2022-03-14 MED ORDER — ACETAMINOPHEN 500 MG PO TABS
ORAL_TABLET | ORAL | Status: AC
  Administered 2022-03-14: 1000 mg via ORAL
  Filled 2022-03-14: qty 2

## 2022-03-14 MED ORDER — ENOXAPARIN SODIUM 80 MG/0.8ML IJ SOSY
PREFILLED_SYRINGE | INTRAMUSCULAR | Status: AC
  Filled 2022-03-14: qty 0.8

## 2022-03-14 MED ORDER — KETOROLAC TROMETHAMINE 15 MG/ML IJ SOLN
INTRAMUSCULAR | Status: AC
  Administered 2022-03-14: 15 mg via INTRAVENOUS
  Filled 2022-03-14: qty 1

## 2022-03-14 MED ORDER — LORATADINE 10 MG PO TABS
ORAL_TABLET | ORAL | Status: AC
  Administered 2022-03-14: 10 mg via ORAL
  Filled 2022-03-14: qty 1

## 2022-03-14 MED ORDER — SENNA 8.6 MG PO TABS
ORAL_TABLET | ORAL | Status: AC
  Administered 2022-03-14: 8.6 mg via ORAL
  Filled 2022-03-14: qty 1

## 2022-03-14 MED ORDER — SENNA 8.6 MG PO TABS: 1.0000 | ORAL_TABLET | Freq: Every day | ORAL | 0 refills | Status: DC | PRN

## 2022-03-14 MED ORDER — DOCUSATE SODIUM 100 MG PO CAPS
ORAL_CAPSULE | ORAL | Status: AC
  Administered 2022-03-14: 100 mg via ORAL
  Filled 2022-03-14: qty 1

## 2022-03-14 MED ORDER — METOPROLOL TARTRATE 25 MG PO TABS
ORAL_TABLET | ORAL | Status: AC
  Administered 2022-03-14: 50 mg via ORAL
  Filled 2022-03-14: qty 1

## 2022-03-14 NOTE — Discharge Instructions (Signed)
Your surgeon has performed an operation on your lumbar spine (low back) to relieve pressure on one or more nerves. Many times, patients feel better immediately after surgery and can "overdo it." Even if you feel well, it is important that you follow these activity guidelines. If you do not let your back heal properly from the surgery, you can increase the chance of a disc herniation and/or return of your symptoms. The following are instructions to help in your recovery once you have been discharged from the hospital.  * It is ok to take NSAIDs after surgery.  Activity    No bending, lifting, or twisting ("BLT"). Avoid lifting objects heavier than 10 pounds (gallon milk jug).  Where possible, avoid household activities that involve lifting, bending, pushing, or pulling such as laundry, vacuuming, grocery shopping, and childcare. Try to arrange for help from friends and family for these activities while your back heals.  Increase physical activity slowly as tolerated.  Taking short walks is encouraged, but avoid strenuous exercise. Do not jog, run, bicycle, lift weights, or participate in any other exercises unless specifically allowed by your doctor. Avoid prolonged sitting, including car rides.  Talk to your doctor before resuming sexual activity.  You should not drive until cleared by your doctor.  Until released by your doctor, you should not return to work or school.  You should rest at home and let your body heal.   You may shower two days after your surgery.  After showering, lightly dab your incision dry. Do not take a tub bath or go swimming for 3 weeks, or until approved by your doctor at your follow-up appointment.  If you smoke, we strongly recommend that you quit.  Smoking has been proven to interfere with normal healing in your back and will dramatically reduce the success rate of your surgery. Please contact [facility] (800-QUIT-NOW) and use the resources at www.[facility].com for  assistance in stopping smoking.  Surgical Incision   If you have a dressing on your incision, you may remove it three days after your surgery. Keep your incision area clean and dry.  If you have staples or stitches on your incision, you should have a follow up scheduled for removal. If you do not have staples or stitches, you will have steri-strips (small pieces of surgical tape) or Dermabond glue. The steri-strips/glue should begin to peel away within about a week (it is fine if the steri-strips fall off before then). If the strips are still in place one week after your surgery, you may gently remove them.  Diet            You may return to your usual diet. Be sure to stay hydrated.  When to Contact us  Although your surgery and recovery will likely be uneventful, you may have some residual numbness, aches, and pains in your back and/or legs. This is normal and should improve in the next few weeks.  However, should you experience any of the following, contact us immediately: New numbness or weakness Pain that is progressively getting worse, and is not relieved by your pain medications or rest Bleeding, redness, swelling, pain, or drainage from surgical incision Chills or flu-like symptoms Fever greater than 101.0 F (38.3 C) Problems with bowel or bladder functions Difficulty breathing or shortness of breath Warmth, tenderness, or swelling in your calf  Contact Information During office hours (Monday-Friday 9 am to 5 pm), please call your physician at [phone] and ask for Berdine Addison After hours and  weekends, please call [phone] and speak with the neurosurgeon on call For a life-threatening emergency, call 911

## 2022-03-14 NOTE — Discharge Summary (Signed)
Discharge Summary  Patient ID: Rodney Cisneros [facility] MRN: [id number] DOB/AGE: 58-29-1967 58 y.o.  Admit date: 03/13/2022 Discharge date: 03/14/2022  Admission Diagnoses: Lumbar spinal mass, cauda equina syndrome   Discharge Diagnoses:  Principal Problem:   Lumbar stenosis Active Problems:   Cauda equina syndrome (HCC)   Epidural hematoma (HCC)   Lumbar epidural mass [facility])   Discharged Condition: good  Hospital Course:  Rodney Cisneros a 57 year old presenting with symptoms concerning for cauda equina and lumbar stenosis status post L2-3 laminectomy for decompression and resection of spinal mass.  Cystic structure was sent for pathology and final pathology is pending.  Patient was admitted overnight for pain control and therapy evaluation.  He was seen by physical therapy on postop day 1 and deemed appropriate for discharge home.  His drain output was monitored and his drain was removed on postop day 1.  He reported good pain control and was able to discharge home on postop day 1 with prescription for oxycodone, Robaxin, and senna.  Consults:  none  Significant Diagnostic Studies: none  Treatments: surgery: as above. Please see separately dictated operative report for further details  Discharge Exam: Blood pressure 134/84, pulse 89, temperature (!) 96.8 F (36 C), resp. rate 18, height 6' (1.829 m), weight 111.1 kg, SpO2 96 %.  AA Ox3 CNI   Strength:5/5 throughout BLE Incision with clean postop dressing in place.  Disposition: Discharge disposition: 01-Home or Self Care       Discharge Instructions     Incentive spirometry RT   Complete by: As directed       Allergies as of 03/14/2022       Reactions   Sulfa Antibiotics Hives        Medication List     STOP taking these medications    cyclobenzaprine 10 MG tablet Commonly known as: FLEXERIL   cyclobenzaprine 5 MG tablet Commonly known as: FLEXERIL   oxyCODONE-acetaminophen 5-325 MG tablet Commonly known as:  PERCOCET/ROXICET       TAKE these medications    albuterol 108 (90 Base) MCG/ACT inhaler Commonly known as: VENTOLIN HFA Inhale 1-2 puffs into the lungs every 4 (four) hours as needed for wheezing or shortness of breath.   cholecalciferol 1000 units tablet Commonly known as: VITAMIN D Take 1,000 Units by mouth daily.   colchicine 0.6 MG tablet Take 0.6 mg by mouth 2 (two) times daily as needed.   dicyclomine 10 MG capsule Commonly known as: BENTYL Take 10 mg by mouth 3 (three) times daily as needed for spasms.   DULoxetine 60 MG capsule Commonly known as: CYMBALTA Take 60 mg by mouth daily.   fluticasone 50 MCG/ACT nasal spray Commonly known as: FLONASE Place 1 spray into both nostrils 2 (two) times daily. What changed:  when to take this reasons to take this   lamoTRIgine 100 MG tablet Commonly known as: LAMICTAL Take 100 mg by mouth daily.   lidocaine 5 % Commonly known as: Lidoderm Place 1 patch onto the skin every 12 (twelve) hours. Remove & Discard patch within 12 hours or as directed by MD   lisinopril 20 MG tablet Commonly known as: ZESTRIL Take 20 mg by mouth daily.   loratadine 10 MG tablet Commonly known as: CLARITIN Take 10 mg by mouth 2 (two) times daily.   methocarbamol 500 MG tablet Commonly known as: ROBAXIN Take 1 tablet (500 mg total) by mouth every 6 (six) hours as needed for muscle spasms.   metoprolol tartrate 50 MG tablet  Commonly known as: LOPRESSOR Take 50 mg by mouth 2 (two) times daily.   montelukast 10 MG tablet Commonly known as: SINGULAIR Take 1 tablet (10 mg total) by mouth at bedtime.   multivitamin with minerals Tabs tablet Take 1 tablet by mouth daily.   naproxen sodium 220 MG tablet Commonly known as: ALEVE Take 220-440 mg by mouth daily as needed (pain).   omeprazole 20 MG capsule Commonly known as: PRILOSEC Take 1 capsule (20 mg total) by mouth 2 (two) times daily before a meal.   oxyCODONE 5 MG immediate  release tablet Commonly known as: Oxy IR/ROXICODONE Take 1 tablet (5 mg total) by mouth every 3 (three) hours as needed for moderate pain ((score 4 to 6)).   predniSONE 10 MG tablet Commonly known as: DELTASONE Take by mouth. Does pak What changed: Another medication with the same name was removed. Continue taking this medication, and follow the directions you see here.   senna 8.6 MG Tabs tablet Commonly known as: SENOKOT Take 1 tablet (8.6 mg total) by mouth daily as needed for mild constipation.   sodium chloride 0.65 % Soln nasal spray Commonly known as: OCEAN Place 2 sprays into both nostrils every 2 (two) hours while awake. What changed:  when to take this reasons to take this   VISINE OP Place 1 drop into both eyes daily as needed (irritation).        Follow-up Information     Geronimo Boot, PA-C Follow up on 03/27/2022.   Specialty: Neurosurgery Contact information: [place] [address] [phone]                 Signed: Loleta Dicker 03/14/2022, 10:24 AM

## 2022-03-14 NOTE — Evaluation (Signed)
Occupational Therapy Evaluation Patient Details Name: Rodney Cisneros [facility] MRN: [id number] DOB: 11/30/1965 Today's Date: 03/14/2022   History of Present Illness Pt is a 57 y/o M admitted on 03/13/22 after being sent to the ED from [facility]. Pt was recommended to come to the ED after MRI performed on 2/25 showed possible subacute epidural hemorrhage. Pt underwent sx for L2-3 decompression & resection of the epidural component of the lesion compressing his spinal cord. PMH: anxiety, chronic ulcerative rectosigmoiditis, colitis, depression, diverticulitis, epdidymitis, HTN, prostatitis, tubular adenoma   Clinical Impression   Upon entering the room, pt seated in recliner chair and agreeable to OT evaluation and education. OT reviews back precautions with pt related to self care and functional mobility. Pt verbalized understanding. Pt dons pull over shirt with set up A to obtain needed items. Pt needs cuing but demonstrates figure four position to thread clothing onto feet and stands to pull over B hips without assistance and LOB. Discussed other home scenarios for safety and pt feels prepared for discharge with no further questions at this time. His partner is available to assist if needed once returning home. Pt with no further acute OT needs. OT to complete order.      Recommendations for follow up therapy are one component of a multi-disciplinary discharge planning process, led by the attending physician.  Recommendations may be updated based on patient status, additional functional criteria and insurance authorization.   Follow Up Recommendations  No OT follow up     Assistance Recommended at Discharge PRN  Patient can return home with the following Assistance with cooking/housework;Assist for transportation       Equipment Recommendations  None recommended by OT       Precautions / Restrictions Precautions Precautions: Back Restrictions Weight Bearing Restrictions: No       Mobility Bed Mobility               General bed mobility comments: pt seated in recliner chair at beginning and end    Transfers Overall transfer level: Independent Equipment used: None                      Balance     Sitting balance-Leahy Scale: Normal       Standing balance-Leahy Scale: Good                             ADL either performed or assessed with clinical judgement   ADL Overall ADL's : Needs assistance/impaired                                       General ADL Comments: supervision overall for dressing UB and LB with cuing for techniques secondary to back precautions. Pt able to perform figure four position to thread clothing.     Vision Patient Visual Report: No change from baseline              Pertinent Vitals/Pain Pain Assessment Pain Assessment: No/denies pain     Hand Dominance Right   Extremity/Trunk Assessment Upper Extremity Assessment Upper Extremity Assessment: Overall WFL for tasks assessed   Lower Extremity Assessment Lower Extremity Assessment: Overall WFL for tasks assessed   Cervical / Trunk Assessment Cervical / Trunk Assessment: Normal   Communication Communication Communication: No difficulties   Cognition Arousal/Alertness: Awake/alert Behavior During Therapy: WFL for  tasks assessed/performed Overall Cognitive Status: Within Functional Limits for tasks assessed                                       General Comments  PT educated pt on back precautions ("BLT")            Home Living Family/patient expects to be discharged to:: Private residence Living Arrangements: Spouse/significant other Available Help at Discharge: Family Type of Home: House Home Access: Stairs to enter Technical brewer of Steps: 2 Entrance Stairs-Rails: None Home Layout: One level     Bathroom Shower/Tub: Chief Strategy Officer: None           Prior Functioning/Environment Prior Level of Function : Independent/Modified Independent;Working/employed;Driving             Mobility Comments: Retired Pharmacist, hospital, still working part time, driving. Prior to a month ago, pt was ambulatory without AD. During past month, pt very pain limited, ambulating house hold distances as able (only able to mobilize ~5 minutes at a time). ADLs Comments: Ind with self care tasks                 OT Goals(Current goals can be found in the care plan section) Acute Rehab OT Goals Patient Stated Goal: to go home OT Goal Formulation: With patient Time For Goal Achievement: 03/14/22 Potential to Achieve Goals: Good  OT Frequency:         AM-PAC OT "6 Clicks" Daily Activity     Outcome Measure Help from another person eating meals?: None Help from another person taking care of personal grooming?: None Help from another person toileting, which includes using toliet, bedpan, or urinal?: None Help from another person bathing (including washing, rinsing, drying)?: None Help from another person to put on and taking off regular upper body clothing?: None Help from another person to put on and taking off regular lower body clothing?: None 6 Click Score: 24   End of Session Nurse Communication: Mobility status  Activity Tolerance: Patient tolerated treatment well Patient left: with call bell/phone within reach;in chair  OT Visit Diagnosis: Unsteadiness on feet (R26.81)                Time: [id number] OT Time Calculation (min): 9 min Charges:  OT General Charges $OT Visit: 1 Visit OT Evaluation $OT Eval Low Complexity: 1 Low  Darleen Crocker, MS, OTR/L , CBIS ascom [phone]  03/14/22, 11:17 AM

## 2022-03-14 NOTE — Plan of Care (Signed)
Patient ready for discharge. 

## 2022-03-14 NOTE — Evaluation (Signed)
Physical Therapy Evaluation Patient Details Name: Rodney Cisneros MRN: [id number] DOB: 1965-10-01 Today's Date: 03/14/2022  History of Present Illness  Pt is a 57 y/o M admitted on 03/13/22 after being sent to the ED from [facility]. Pt was recommended to come to the ED after MRI performed on 2/25 showed possible subacute epidural hemorrhage. Pt underwent sx for L2-3 decompression & resection of the epidural component of the lesion compressing his spinal cord. PMH: anxiety, chronic ulcerative rectosigmoiditis, colitis, depression, diverticulitis, epdidymitis, HTN, prostatitis, tubular adenoma  Clinical Impression  Pt seen for PT evaluation with pt agreeable. Pt reports prior to admission he was independent without AD, still working part time & driving until ~1 month ago when pt became very pain limited, only mobilizing ~5 minutes at a time after being premedicated. On this date, PT educates pt on back precautions & pt is able to complete log rolling with supervision, STS & gait without AD with independence. Pt negotiates 4 steps without rails with supervision fade to mod I. Pt reports no pain on this date, which is a huge improvement prior to sx. At this time, pt does not require acute PT services. PT to complete current orders at this time.   Recommendations for follow up therapy are one component of a multi-disciplinary discharge planning process, led by the attending physician.  Recommendations may be updated based on patient status, additional functional criteria and insurance authorization.  Follow Up Recommendations No PT follow up      Assistance Recommended at Discharge PRN  Patient can return home with the following  Assistance with cooking/housework    Equipment Recommendations None recommended by PT  Recommendations for Other Services       Functional Status Assessment Patient has not had a recent decline in their functional status     Precautions / Restrictions  Precautions Precautions: Back Restrictions Weight Bearing Restrictions: No      Mobility  Bed Mobility   Bed Mobility: Rolling, Sidelying to Sit Rolling: Supervision Sidelying to sit: Supervision       General bed mobility comments: PT provides educational cuing re: log rolling from flat bed.    Transfers Overall transfer level: Independent Equipment used: None               General transfer comment: STS from EOB    Ambulation/Gait Ambulation/Gait assistance: Independent Gait Distance (Feet): 300 Feet Assistive device: None Gait Pattern/deviations: WFL(Within Functional Limits) Gait velocity: WNL        Stairs Stairs: Yes Stairs assistance: Modified independent (Device/Increase time), Supervision Stair Management: No rails Number of Stairs: 4 General stair comments: Pt negotiates 4 steps without rails with supervision fade to mod I.  Wheelchair Mobility    Modified Rankin (Stroke Patients Only)       Balance     Sitting balance-Leahy Scale: Normal       Standing balance-Leahy Scale: Good                               Pertinent Vitals/Pain Pain Assessment Pain Assessment: No/denies pain    Home Living Family/patient expects to be discharged to:: Private residence Living Arrangements: Spouse/significant other Available Help at Discharge: Family (partner plans to take some time off work to assist pt) Type of Home: House Home Access: Stairs to enter Entrance Stairs-Rails: None Entrance Stairs-Number of Steps: 2   Home Layout: One level        Prior  Function Prior Level of Function : Independent/Modified Independent;Working/employed;Driving             Mobility Comments: Retired Pharmacist, hospital, still working part time, driving. Prior to a month ago, pt was ambulatory without AD. During past month, pt very pain limited, ambulating house hold distances as able (only able to mobilize ~5 minutes at a time).       Hand  Dominance        Extremity/Trunk Assessment   Upper Extremity Assessment Upper Extremity Assessment: Overall WFL for tasks assessed    Lower Extremity Assessment Lower Extremity Assessment: Overall WFL for tasks assessed    Cervical / Trunk Assessment Cervical / Trunk Assessment: Normal  Communication   Communication: No difficulties  Cognition Arousal/Alertness: Awake/alert Behavior During Therapy: WFL for tasks assessed/performed Overall Cognitive Status: Within Functional Limits for tasks assessed                                          General Comments General comments (skin integrity, edema, etc.): PT educated pt on back precautions ("BLT")    Exercises     Assessment/Plan    PT Assessment Patient does not need any further PT services  PT Problem List         PT Treatment Interventions      PT Goals (Current goals can be found in the Care Plan section)  Acute Rehab PT Goals Patient Stated Goal: return home PT Goal Formulation: With patient Time For Goal Achievement: 03/28/22 Potential to Achieve Goals: Good    Frequency       Co-evaluation               AM-PAC PT "6 Clicks" Mobility  Outcome Measure Help needed turning from your back to your side while in a flat bed without using bedrails?: None Help needed moving from lying on your back to sitting on the side of a flat bed without using bedrails?: None Help needed moving to and from a bed to a chair (including a wheelchair)?: None Help needed standing up from a chair using your arms (e.g., wheelchair or bedside chair)?: None Help needed to walk in hospital room?: None Help needed climbing 3-5 steps with a railing? : None 6 Click Score: 24    End of Session   Activity Tolerance: Patient tolerated treatment well Patient left:  (in bathroom) Nurse Communication: Mobility status      Time: [id number] PT Time Calculation (min) (ACUTE ONLY): 15 min   Charges:   PT  Evaluation $PT Eval Moderate Complexity: 1 Mod          Lavone Nian, PT, DPT 03/14/22, 10:20 AM   Waunita Schooner 03/14/2022, 10:18 AM

## 2022-03-14 NOTE — Progress Notes (Signed)
    Attending Progress Note  History: Rodney Cisneros [facility] is  s/p L2-3 lumbar laminectomy on 03/13/2022.  POD1: Patient reports significant relief of his preop radiating leg pain and feels like his pain is well-controlled.  Physical Exam: Vitals:   03/14/22 0053 03/14/22 0434  BP: 137/82 134/84  Pulse: (!) 102 89  Resp:  18  Temp:  (!) 96.8 F (36 C)  SpO2: 97% 96%    AA Ox3 CNI  Strength:5/5 throughout BLE  Data:  Other tests/results: none  Assessment/Plan:  Rodney Cisneros is a 57 y.o presenting with symptoms concerning for cauda equina with associated lumbar stenosis status post decompression.  - mobilize - pain control - DVT prophylaxis - PTOT  Cooper Render PA-C Department of Neurosurgery

## 2022-03-18 ENCOUNTER — Telehealth: Payer: Self-pay | Admitting: Neurosurgery

## 2022-03-18 LAB — SURGICAL PATHOLOGY

## 2022-03-18 NOTE — Telephone Encounter (Signed)
LVM

## 2022-03-20 ENCOUNTER — Telehealth: Payer: Self-pay | Admitting: Neurosurgery

## 2022-03-20 NOTE — Telephone Encounter (Signed)
Left VM

## 2022-03-24 NOTE — Progress Notes (Unsigned)
   REFERRING PHYSICIAN:  Rusty Aus, Md [address] [facility] [place],  [place] [address]  DOS: 03/13/22  L2-3 lumbar laminectomy for resection of spinal mass, including use of microscope   HISTORY OF PRESENT ILLNESS: Rodney Cisneros is approximately 2 weeks status post L2-3 lumbar laminectomy for resection of spinal mass, including use of microscope. Was given oxycodone and robaxin on discharge from the hospital.   He is doing very well. He has mild lumbar discomfort with intermittent pain in both legs. He feels much better then he did prior to surgery. He is taking oxycodone prn and needs a refill. He is good on robaxin.   He remains out of work. 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   Pathology came back as epidural lesion, no malignancy.    PHYSICAL EXAMINATION:  General: Patient is well developed, well nourished, calm, collected, and in no apparent distress.   NEUROLOGICAL:  General: In no acute distress.   Awake, alert, oriented to person, place, and time.  Pupils equal round and reactive to light.  Facial tone is symmetric.     Strength:          Side Iliopsoas Quads Hamstring PF DF EHL  R 5 5 5 5 5 5   L 5 5 5 5 5 5    Incision c/d/i   ROS (Neurologic):  Negative except as noted above  IMAGING: Nothing new to review.   ASSESSMENT/PLAN:  Rodney Cisneros is doing very well s/p above surgery. Pathology reviewed with him as above. Treatment options reviewed with patient and following plan made:   - I have advised the patient to lift up to 10 pounds until 6 weeks after surgery (follow up with Dr. Izora Ribas).  - Reviewed wound care.  - No bending, twisting, or lifting.  - He remains out of work. Note given.  - Continue on current medications including prn oxycodone and robaxin.  - Refill of oxycodone given. Reviewed dosing and side effects. PMP reviewed and is appropriate.   - Follow up as scheduled in 4 weeks  and prn.   Advised to contact the office if any questions or concerns arise.  Geronimo Boot PA-C Department of neurosurgery

## 2022-03-27 ENCOUNTER — Encounter: Payer: Self-pay | Admitting: Orthopedic Surgery

## 2022-03-27 ENCOUNTER — Ambulatory Visit (INDEPENDENT_AMBULATORY_CARE_PROVIDER_SITE_OTHER): Payer: Medicaid Other | Admitting: Orthopedic Surgery

## 2022-03-27 VITALS — BP 128/80 | Temp 98.2°F | Ht 72.0 in | Wt 245.0 lb

## 2022-03-27 DIAGNOSIS — M48061 Spinal stenosis, lumbar region without neurogenic claudication: Secondary | ICD-10-CM

## 2022-03-27 DIAGNOSIS — G9589 Other specified diseases of spinal cord: Secondary | ICD-10-CM

## 2022-03-27 DIAGNOSIS — Z09 Encounter for follow-up examination after completed treatment for conditions other than malignant neoplasm: Secondary | ICD-10-CM

## 2022-03-27 DIAGNOSIS — G834 Cauda equina syndrome: Secondary | ICD-10-CM

## 2022-03-27 DIAGNOSIS — Z9889 Other specified postprocedural states: Secondary | ICD-10-CM

## 2022-03-27 MED ORDER — OXYCODONE HCL 5 MG PO TABS: 5.0000 mg | ORAL_TABLET | Freq: Three times a day (TID) | ORAL | 0 refills | Status: DC | PRN

## 2022-04-29 ENCOUNTER — Encounter: Payer: Self-pay | Admitting: Neurosurgery

## 2022-04-29 ENCOUNTER — Ambulatory Visit (INDEPENDENT_AMBULATORY_CARE_PROVIDER_SITE_OTHER): Payer: Medicaid Other | Admitting: Neurosurgery

## 2022-04-29 VITALS — BP 133/77 | Temp 98.4°F | Ht 72.0 in | Wt 245.0 lb

## 2022-04-29 DIAGNOSIS — M48061 Spinal stenosis, lumbar region without neurogenic claudication: Secondary | ICD-10-CM

## 2022-04-29 DIAGNOSIS — Z09 Encounter for follow-up examination after completed treatment for conditions other than malignant neoplasm: Secondary | ICD-10-CM

## 2022-04-29 DIAGNOSIS — G834 Cauda equina syndrome: Secondary | ICD-10-CM

## 2022-04-29 DIAGNOSIS — G9589 Other specified diseases of spinal cord: Secondary | ICD-10-CM

## 2022-04-29 NOTE — Progress Notes (Signed)
   REFERRING PHYSICIAN:  Danella Penton, Md [address] [facility] [place],  [place] [address]  DOS: 03/13/22  L2-3 lumbar laminectomy for resection of spinal mass, including use of microscope   HISTORY OF PRESENT ILLNESS: Rodney Cisneros [facility] is status post L2-3 lumbar decompression and resection of mass (likely disc herniation).  He is doing much better.  The pain he had prior to surgery is much improved.  He has some pain down his left leg but he has had since his initial surgery many years ago.  He did return to work this past weekend.   PHYSICAL EXAMINATION:  General: Patient is well developed, well nourished, calm, collected, and in no apparent distress.   NEUROLOGICAL:  General: In no acute distress.   Awake, alert, oriented to person, place, and time.  Pupils equal round and reactive to light.  Facial tone is symmetric.     Strength:          Side Iliopsoas Quads Hamstring PF DF EHL  R L Incision c/d/i   ROS (Neurologic):  Negative except as noted above  IMAGING: Nothing new to review.   ASSESSMENT/PLAN:  Rodney Cisneros [facility] is doing very well s/p above surgery.  We reviewed his activity limitations.  He is cleared to lift 25 pounds.  We will see him back in 6 weeks.  Venetia Night MD Department of neurosurgery

## 2022-05-28 NOTE — Progress Notes (Deleted)
   REFERRING PHYSICIAN:  Danella Penton, Md [address] [facility] [place],  [place] [address]  DOS: 03/13/22  L2-3 lumbar laminectomy for resection of spinal mass, including use of microscope   HISTORY OF PRESENT ILLNESS: Rodney Cisneros is status post L2-3 lumbar decompression and resection of mass (likely disc herniation).  He was doing well at his last follow up with Dr. Myer Haff on 04/29/22. Cleared to lift 25 pounds.        He is doing much better.  The pain he had prior to surgery is much improved.  He has some pain down his left leg but he has had since his initial surgery many years ago.  He did return to work this past weekend.   PHYSICAL EXAMINATION:  General: Patient is well developed, well nourished, calm, collected, and in no apparent distress.   NEUROLOGICAL:  General: In no acute distress.   Awake, alert, oriented to person, place, and time.  Pupils equal round and reactive to light.  Facial tone is symmetric.     Strength:          Side Iliopsoas Quads Hamstring PF DF EHL  R 5 5 5 5 5 5   L 5 5 5 5 5 5    Incision c/d/i   ROS (Neurologic):  Negative except as noted above  IMAGING: Nothing new to review.   ASSESSMENT/PLAN:  Rodney Cisneros is doing very well s/p above surgery. Treatment options reviewed with patient and following plan made:        We reviewed his activity limitations.  He is cleared to lift 25 pounds.  We will see him back in 6 weeks.     Drake Leach PA-C Department of neurosurgery

## 2022-06-03 ENCOUNTER — Encounter: Payer: Medicaid Other | Admitting: Orthopedic Surgery

## 2022-06-03 ENCOUNTER — Encounter: Payer: Medicaid Other | Admitting: Neurosurgery

## 2022-06-03 DIAGNOSIS — G9589 Other specified diseases of spinal cord: Secondary | ICD-10-CM

## 2022-06-03 DIAGNOSIS — Z9889 Other specified postprocedural states: Secondary | ICD-10-CM

## 2022-06-13 NOTE — Progress Notes (Addendum)
REFERRING PHYSICIAN:  Danella Penton, Md [address] [facility] [place],  [place] [address]  DOS: 03/13/22  L2-3 lumbar laminectomy for resection of spinal mass, including use of microscope   HISTORY OF PRESENT ILLNESS: [facility] is status post L2-3 lumbar decompression and resection of mass (likely disc herniation).  He was doing well at his last follow up with Dr. Myer Haff on 04/29/22. Cleared to lift 25 pounds.   He has 1 month history of constant pain between his shoulder blades that varies in severity. No neck pain. No radiation to his ribs. Pain is worse at night and with sitting on hard surface.   He has intermittent left sided LBP that is tolerable. No leg pain. No numbness, tingling, or weakness.   Minimal relief with motrin.    PHYSICAL EXAMINATION:   Awake, alert, oriented to person, place, and time.  Speech is clear and fluent. Fund of knowledge is appropriate.   Cranial Nerves: Pupils equal round and reactive to light.  Facial tone is symmetric.    No posterior cervical tenderness. No tenderness in bilateral trapezial region.   He has thoracic tenderness between the shoulder blades and in the scapular region bilaterally.   No posterior lumbar tenderness.  Lumbar incision is well healed.   No abnormal lesions on exposed skin.   Strength: Side Biceps Triceps Deltoid Interossei Grip Wrist Ext. Wrist Flex.  R 5 5 5 5 5 5 5   L 5 5 5 5 5 5 5    Side Iliopsoas Quads Hamstring PF DF EHL  R 5 5 5 5 5 5   L 5 5 5 5 5 5    Reflexes are 2+ and symmetric at the biceps, triceps, brachioradialis, patella and achilles.   Hoffman's is absent.  Clonus is not present.   Bilateral upper and lower extremity sensation is intact to light touch.     Gait is normal.    ROS (Neurologic):  Negative except as noted above  IMAGING: Nothing new to review.   ASSESSMENT/PLAN:  [facility] C[facility]ner[facility] is doing very well s/p above surgery.  He has  intermittent left sided LBP that is tolerable.   Primary complaint is 1 month history of constant pain between his shoulder blades that varies in severity. No neck pain. No radiation to his ribs. Pain is worse at night and with sitting on hard surface.   Pain is likely myofascial in nature- he has tenderness on exam.   Treatment options reviewed with patient and following plan made:   - Thoracic xrays ordered. Will call him with results.  - He will restart prn robaxin. Reviewed dosing and side effects. He knows this can make him sleepy. He has some at home. Call if he needs a refill. - Okay to continue OTC motrin as directed on bottle. Take with food.  - If no improvement, then will consider PT for thoracic spine. Discussed today and he declines.  - For now, he will f/u with Dr. Myer Haff in 3 months for his postop visit, but can see earlier if needed for thoracic pain.   - As far as postop restrictions. He can return to activity as tolerated. Would still be careful with lifting.   ADDENDUM 07/21/22:  Thoracic xrays dated 07/15/22:  FINDINGS: There is no evidence of thoracic spine fracture. Alignment is normal. No other significant bone abnormalities are identified.   IMPRESSION: No acute fracture or malalignment.     Electronically Signed   By:  Marliss Coots M.D.   On: 07/21/2022 07:31  I have personally reviewed the images and agree with the above interpretation.  Will call him with xray results. Consider PT if thoracic pain is worse.   Drake Leach PA-C Department of neurosurgery

## 2022-06-20 ENCOUNTER — Encounter: Payer: Self-pay | Admitting: Orthopedic Surgery

## 2022-06-20 ENCOUNTER — Ambulatory Visit (INDEPENDENT_AMBULATORY_CARE_PROVIDER_SITE_OTHER): Payer: Medicaid Other | Admitting: Orthopedic Surgery

## 2022-06-20 VITALS — BP 135/80 | Temp 98.3°F | Ht 72.0 in | Wt 245.0 lb

## 2022-06-20 DIAGNOSIS — Z09 Encounter for follow-up examination after completed treatment for conditions other than malignant neoplasm: Secondary | ICD-10-CM | POA: Diagnosis not present

## 2022-06-20 DIAGNOSIS — M546 Pain in thoracic spine: Secondary | ICD-10-CM

## 2022-06-20 DIAGNOSIS — Z9889 Other specified postprocedural states: Secondary | ICD-10-CM

## 2022-06-20 DIAGNOSIS — G9589 Other specified diseases of spinal cord: Secondary | ICD-10-CM | POA: Diagnosis not present

## 2022-07-15 ENCOUNTER — Ambulatory Visit
Admission: RE | Admit: 2022-07-15 | Discharge: 2022-07-15 | Disposition: A | Discharge status: Home / Self Care | Payer: Medicaid Other | Source: Ambulatory Visit | Attending: Orthopedic Surgery | Admitting: Orthopedic Surgery

## 2022-07-15 ENCOUNTER — Ambulatory Visit
Admission: RE | Admit: 2022-07-15 | Discharge: 2022-07-15 | Disposition: A | Discharge status: Home / Self Care | Payer: Medicaid Other | Attending: Orthopedic Surgery | Admitting: Orthopedic Surgery

## 2022-07-15 DIAGNOSIS — M546 Pain in thoracic spine: Secondary | ICD-10-CM | POA: Diagnosis present

## 2022-07-21 ENCOUNTER — Telehealth: Payer: Self-pay | Admitting: Orthopedic Surgery

## 2022-07-21 NOTE — Telephone Encounter (Signed)
Please call and let him now that his thoracic xrays looked good.   If he is still having pain, I recommend he do some PT for the mid back. Let me know if he wants to do this.   If not, he should keep his scheduled f/u with Dr. Myer Haff.

## 2022-09-16 ENCOUNTER — Encounter: Payer: Medicaid Other | Admitting: Neurosurgery

## 2022-10-07 ENCOUNTER — Encounter: Payer: Self-pay | Admitting: Neurosurgery

## 2022-10-07 ENCOUNTER — Ambulatory Visit (INDEPENDENT_AMBULATORY_CARE_PROVIDER_SITE_OTHER): Payer: Medicaid Other | Admitting: Neurosurgery

## 2022-10-07 VITALS — BP 140/78 | Ht 72.0 in | Wt 245.0 lb

## 2022-10-07 DIAGNOSIS — G959 Disease of spinal cord, unspecified: Secondary | ICD-10-CM | POA: Diagnosis not present

## 2022-10-07 DIAGNOSIS — R2689 Other abnormalities of gait and mobility: Secondary | ICD-10-CM

## 2022-10-07 DIAGNOSIS — M5416 Radiculopathy, lumbar region: Secondary | ICD-10-CM

## 2022-10-07 DIAGNOSIS — M5116 Intervertebral disc disorders with radiculopathy, lumbar region: Secondary | ICD-10-CM | POA: Diagnosis not present

## 2022-10-07 DIAGNOSIS — R292 Abnormal reflex: Secondary | ICD-10-CM | POA: Diagnosis not present

## 2022-10-07 MED ORDER — METHYLPREDNISOLONE 4 MG PO TBPK: ORAL_TABLET | ORAL | 0 refills | Status: DC

## 2022-10-07 NOTE — Progress Notes (Signed)
   REFERRING PHYSICIAN:  No referring provider defined for this encounter.  DOS: 03/13/22  L2-3 lumbar laminectomy for resection of spinal mass, including use of microscope   HISTORY OF PRESENT ILLNESS: [facility] is status post L2-3 lumbar decompression and resection of mass (likely disc herniation).  Over the past week, he has had severe pain in his left lower back and down the back of his left leg.  He denies pain on his anterior thigh.  He denies numbness.  Coughing or sneezing makes it worse.  He is having some difficulty with work due to this level of pain.  He previously was doing quite well.  He also has had some pain in his left shoulder blade.  He reports some problems with his balance.    PHYSICAL EXAMINATION:   Awake, alert, oriented to person, place, and time.  Speech is clear and fluent. Fund of knowledge is appropriate.   Cranial Nerves: Pupils equal round and reactive to light.  Facial tone is symmetric.    No posterior cervical tenderness. No tenderness in bilateral trapezial region.   He has thoracic tenderness between the shoulder blades and in the scapular region bilaterally.   No posterior lumbar tenderness.  Lumbar incision is well healed.   No abnormal lesions on exposed skin.   Strength: Side Biceps Triceps Deltoid Interossei Grip Wrist Ext. Wrist Flex.  R 5 5 5 5 5 5 5   L 5 5 5 5 5 5 5    Side Iliopsoas Quads Hamstring PF DF EHL  R 5 5 5 5 5 5   L 3 5 5 5 5 5    Reflexes are 3+ and symmetric at the biceps, triceps, brachioradialis, patella and achilles.   Hoffman's is present on the right.  Clonus is not present.   Bilateral upper and lower extremity sensation is intact to light touch.     Gait is antalgic and wide-based.    ROS (Neurologic):  Negative except as noted above  IMAGING: Nothing new to review.   ASSESSMENT/PLAN:  [facility] has had a setback after his surgery.  I am concerned that he may have a recurrent disc  herniation.  He is now exhibiting some weakness that he did not previously have.  Additionally, he reports some problems with his balance and has hyperreflexia on examination.  This raises the concern for possible myelopathy.  To evaluate his condition, I have recommended a cervical spine and lumbar spine MRI scan without contrast.  This will help guide further treatments.  He has been started on a low-dose prednisone for a psoriasis outbreak.  I will give him a short steroid burst of methylprednisolone for 6 days and then he will start back on his prednisone.  Venetia Night Department of neurosurgery

## 2022-11-01 ENCOUNTER — Ambulatory Visit
Admission: RE | Admit: 2022-11-01 | Discharge: 2022-11-01 | Disposition: A | Discharge status: Home / Self Care | Payer: Medicaid Other | Source: Ambulatory Visit | Attending: Neurosurgery | Admitting: Neurosurgery

## 2022-11-01 DIAGNOSIS — G959 Disease of spinal cord, unspecified: Secondary | ICD-10-CM

## 2022-11-01 DIAGNOSIS — M5416 Radiculopathy, lumbar region: Secondary | ICD-10-CM

## 2022-11-01 DIAGNOSIS — R292 Abnormal reflex: Secondary | ICD-10-CM

## 2022-11-01 DIAGNOSIS — M5116 Intervertebral disc disorders with radiculopathy, lumbar region: Secondary | ICD-10-CM

## 2022-11-01 DIAGNOSIS — R2689 Other abnormalities of gait and mobility: Secondary | ICD-10-CM

## 2022-12-04 ENCOUNTER — Ambulatory Visit: Payer: Medicaid Other | Admitting: Neurosurgery

## 2022-12-09 ENCOUNTER — Encounter: Payer: Self-pay | Admitting: Neurosurgery

## 2022-12-09 ENCOUNTER — Ambulatory Visit (INDEPENDENT_AMBULATORY_CARE_PROVIDER_SITE_OTHER): Payer: Medicaid Other | Admitting: Neurosurgery

## 2022-12-09 VITALS — BP 132/88 | Ht 72.0 in | Wt 245.0 lb

## 2022-12-09 DIAGNOSIS — M5412 Radiculopathy, cervical region: Secondary | ICD-10-CM | POA: Diagnosis not present

## 2022-12-09 DIAGNOSIS — M4802 Spinal stenosis, cervical region: Secondary | ICD-10-CM | POA: Diagnosis not present

## 2022-12-09 DIAGNOSIS — M48062 Spinal stenosis, lumbar region with neurogenic claudication: Secondary | ICD-10-CM | POA: Diagnosis not present

## 2022-12-09 DIAGNOSIS — M5416 Radiculopathy, lumbar region: Secondary | ICD-10-CM | POA: Diagnosis not present

## 2022-12-09 NOTE — Progress Notes (Signed)
REFERRING PHYSICIAN:  No referring provider defined for this encounter.  DOS: 03/13/22  L2-3 lumbar laminectomy for resection of spinal mass, including use of microscope   HISTORY OF PRESENT ILLNESS: 12/09/2022 Mr. Ellingsen continues to have symptoms.  He has had significant flares with his psoriasis.  He continues to have numbness in his left hand.  He is having back and leg discomfort particularly in his left foot extending down to the top of his foot.  10/07/2022 [facility] is status post L2-3 lumbar decompression and resection of mass (likely disc herniation).  Over the past week, he has had severe pain in his left lower back and down the back of his left leg.  He denies pain on his anterior thigh.  He denies numbness.  Coughing or sneezing makes it worse.  He is having some difficulty with work due to this level of pain.  He previously was doing quite well.  He also has had some pain in his left shoulder blade.  He reports some problems with his balance.    PHYSICAL EXAMINATION:   Awake, alert, oriented to person, place, and time.  Speech is clear and fluent. Fund of knowledge is appropriate.   Cranial Nerves: Pupils equal round and reactive to light.  Facial tone is symmetric.    No posterior cervical tenderness. No tenderness in bilateral trapezial region.   He has thoracic tenderness between the shoulder blades and in the scapular region bilaterally.   No posterior lumbar tenderness.  Lumbar incision is well healed.   No abnormal lesions on exposed skin.   Strength: Side Biceps Triceps Deltoid Interossei Grip Wrist Ext. Wrist Flex.  R 5 5 5 5 5 5 5   L 5 5 5 5 5 5 5    Side Iliopsoas Quads Hamstring PF DF EHL  R 5 5 5 5 5 5   L 4 5 5 5 5 5    Reflexes are 2+ and symmetric at the biceps, triceps, brachioradialis, patella and achilles.   Hoffman's is present on the right.  Clonus is not present.   Bilateral upper and lower extremity sensation is intact to light  touch.       ROS (Neurologic):  Negative except as noted above  IMAGING: MRI CL spine 11/01/2022 IMPRESSION: 1. Successful decompression at L2-3 with no recurrent epidural lesion or thecal sac stenosis. Chronic left foraminal protrusion at this level with patent foramen. 2. L3-4 moderate narrowing of the thecal sac due to degeneration, epidural fat, and short pedicles. 3. L5-S1 chronic moderate left foraminal stenosis from degeneration.     Electronically Signed   By: Tiburcio Pea M.D.   On: 11/26/2022 18:11  IMPRESSION: 1. At least moderate severity bilateral degenerative foraminal impingement at C3-4 to C6-7, sparing the left foramen at C5-6. 2. Diffusely patent spinal canal.     Electronically Signed   By: Tiburcio Pea M.D.   On: 11/26/2022 18:05   ASSESSMENT/PLAN:  [facility] has had a setback after his surgery.  He has some symptoms of left L5 radiculopathy.  He has stenosis at L3-4.  He could have some neurogenic claudication.  In his neck, he has left-sided C4-5 and C6-7 compression which could cause the numbness in his hand.  I think he may be a candidate for physical therapy and injections.  I will send him for these, and see him back in clinic afterwards.    He has had extensive difficulty with the psoriasis and various neck and back  problems.  He is unable to work currently due to the extent of his symptoms.  I spent a total of 20 minutes in this patient's care today. This time was spent reviewing pertinent records including imaging studies, obtaining and confirming history, performing a directed evaluation, formulating and discussing my recommendations, and documenting the visit within the medical record.     Venetia Night Department of neurosurgery

## 2023-01-14 DIAGNOSIS — M5412 Radiculopathy, cervical region: Secondary | ICD-10-CM

## 2023-01-14 DIAGNOSIS — M4802 Spinal stenosis, cervical region: Secondary | ICD-10-CM

## 2023-01-14 HISTORY — DX: Spinal stenosis, cervical region: M48.02

## 2023-01-14 HISTORY — DX: Radiculopathy, cervical region: M54.12

## 2023-02-02 ENCOUNTER — Ambulatory Visit: Payer: Medicaid Other | Admitting: Physical Therapy

## 2023-02-03 ENCOUNTER — Ambulatory Visit: Payer: Medicaid Other | Attending: Neurosurgery

## 2023-02-03 DIAGNOSIS — M5412 Radiculopathy, cervical region: Secondary | ICD-10-CM | POA: Insufficient documentation

## 2023-02-03 DIAGNOSIS — G8929 Other chronic pain: Secondary | ICD-10-CM | POA: Diagnosis present

## 2023-02-03 DIAGNOSIS — M5442 Lumbago with sciatica, left side: Secondary | ICD-10-CM | POA: Insufficient documentation

## 2023-02-03 DIAGNOSIS — M5441 Lumbago with sciatica, right side: Secondary | ICD-10-CM | POA: Diagnosis present

## 2023-02-03 DIAGNOSIS — M4802 Spinal stenosis, cervical region: Secondary | ICD-10-CM | POA: Diagnosis not present

## 2023-02-03 DIAGNOSIS — M48062 Spinal stenosis, lumbar region with neurogenic claudication: Secondary | ICD-10-CM | POA: Insufficient documentation

## 2023-02-03 DIAGNOSIS — M5416 Radiculopathy, lumbar region: Secondary | ICD-10-CM | POA: Insufficient documentation

## 2023-02-03 NOTE — Therapy (Signed)
OUTPATIENT PHYSICAL THERAPY CERVICAL and LUMBAR EVALUATION   Patient Name: Rodney Cisneros MRN: [id number] DOB:1965/01/24, 58 y.o., male Today's Date: 02/04/2023  END OF SESSION:  PT End of Session - 02/03/23 1544     Visit Number 1    Number of Visits 24    Date for PT Re-Evaluation 04/28/23    Progress Note Due on Visit 10    PT Start Time 1533    PT Stop Time 1614    PT Time Calculation (min) 41 min    Activity Tolerance No increased pain;Patient limited by pain    Behavior During Therapy Anxious             Past Medical History:  Diagnosis Date   Anxiety    Chronic ulcerative rectosigmoiditis, with rectal bleeding (HCC) 2016   Colitis    Depression    Diverticulosis    Epididymitis    GERD (gastroesophageal reflux disease)    Hypertension    Prostatitis    Rhinitis, allergic    Tubular adenoma 2017   Past Surgical History:  Procedure Laterality Date   BACK SURGERY  1991   laminectomy-L5   CHOLECYSTECTOMY N/A 09/23/2017   Procedure: LAPAROSCOPIC CHOLECYSTECTOMY;  Surgeon: Carolan Shiver, MD;  Location: [facility] ORS;  Service: General;  Laterality: N/A;   COLONOSCOPY     2016 and 2017   edg  2008   FUNCTIONAL ENDOSCOPIC SINUS SURGERY     LUMBAR LAMINECTOMY/ DECOMPRESSION WITH MET-RX Left 03/13/2022   Procedure: LUMBAR LAMINECTOMY/ DECOMPRESSION WITH MET-RX;  Surgeon: Venetia Night, MD;  Location: [facility] ORS;  Service: Neurosurgery;  Laterality: Left;  lumbar laminectomy L2-3 for resection of epidural lesion   TONSILLECTOMY     Patient Active Problem List   Diagnosis Date Noted   Cauda equina syndrome (HCC) 03/13/2022   Epidural hematoma (HCC) 03/13/2022   Lumbar stenosis 03/13/2022   Lumbar epidural mass (HCC) 03/13/2022   Tubular adenoma 10/29/2015   Ulcerative rectosigmoiditis with rectal bleeding (HCC) 03/20/2014   Essential hypertension 02/01/2014   GERD without esophagitis 02/01/2014    PCP: Dr. Bethann Punches   REFERRING PROVIDER: Dr.  Venetia Night  REFERRING DIAG:  M54.16 (ICD-10-CM) - Lumbar radiculopathy  M54.12 (ICD-10-CM) - Cervical radiculopathy  M48.02 (ICD-10-CM) - Cervical stenosis of spinal canal  M48.062 (ICD-10-CM) - Spinal stenosis of lumbar region with neurogenic claudication    THERAPY DIAG:  Chronic bilateral low back pain with left-sided sciatica  Chronic bilateral low back pain with right-sided sciatica  Rationale for Evaluation and Treatment: Rehabilitation  ONSET DATE: > 6 months ago  SUBJECTIVE:  SUBJECTIVE STATEMENT: Patient reports having cervical and lumbar pain that is progressive. Reports went to neurosurgeon and prescribed PT. States multiple spinal surgeries - most recently on 03/13/2022- L2-3 lumbar laminectomy for resection of spinal mass. He reports pain in neck that can radiate into hands and pain in low back that can radiate down each LE. He has reduced his hours at work from 40 down to approx 18 hours per week.     Hand dominance: Right  PERTINENT HISTORY:  Past spinal surgery- L2-3 lumbar laminectomy for resection of spinal mass. Previous lumbar surgery in 1991. See attached for PMH  PAIN:  Are you having pain? Yes: NPRS scale: 6/10 current LBP; 3/10 at best; 9/10 at worst;  Cervical- 6/10 current; 10/10 at worst; best- 4/10 Pain location: Cervical and Lumbar with sciatica down to knees and sometimes down to feet.  Pain description: Achy with sharp pain.  Aggravating factors: prolonged standing/leaning/  Relieving factors: rest  PRECAUTIONS: None  RED FLAGS: Spinal tumors: Yes: Removed in 02/2022      WEIGHT BEARING RESTRICTIONS: No  FALLS:  Has patient fallen in last 6 months? No  LIVING ENVIRONMENT: Lives with: lives with their family Lives in:  House/apartment Stairs: Yes: External: 8 in front  and 3 in back steps; none Has following equipment at home: None  OCCUPATION: Production designer, theatre/television/film at [employer]  PLOF: Independent  PATIENT GOALS: I want to feel better by 50-75%   NEXT MD VISIT: 02/12/2023- Dr. Myer Haff  OBJECTIVE:  Note: Objective measures were completed at Evaluation unless otherwise noted.  DIAGNOSTIC FINDINGS:  MRI CL spine 11/01/2022 IMPRESSION: 1. Successful decompression at L2-3 with no recurrent epidural lesion or thecal sac stenosis. Chronic left foraminal protrusion at this level with patent foramen. 2. L3-4 moderate narrowing of the thecal sac due to degeneration, epidural fat, and short pedicles. 3. L5-S1 chronic moderate left foraminal stenosis from degeneration.    MRI from [facility] dated 11/01/2022 both images and report were reviewed today.  EXAM:  MRI CERVICAL SPINE WITHOUT CONTRAST    Electronically Signed   By: Tiburcio Pea M.D.   On: 11/26/2022 18:11   IMPRESSION: 1. At least moderate severity bilateral degenerative foraminal impingement at C3-4 to C6-7, sparing the left foramen at C5-6. 2. Diffusely patent spinal canal.     Electronically Signed   By: Tiburcio Pea M.D.   On: 11/26/2022 18:05  PATIENT SURVEYS:  FOTO 44 with goal of 46  COGNITION: Overall cognitive status: Within functional limits for tasks assessed  SENSATION: WFL  POSTURE: rounded shoulders and forward head  PALPATION: (+) tenderness along occiput and B upper trap    CERVICAL ROM:   Active ROM A/PROM (deg) eval  Flexion Chin to chest * painful  Extension 62  Right lateral flexion 22  Left lateral flexion 24  Right rotation 50  Left rotation 48   (Blank rows = not tested)   UPPER EXTREMITY MMT:  MMT Right eval Left eval  Shoulder flexion 5 5  Shoulder extension    Shoulder abduction 5 5  Shoulder adduction    Shoulder extension 5 5  Shoulder internal rotation    Shoulder external  rotation 5 5  Middle trapezius    Lower trapezius    Elbow flexion 5 5  Elbow extension 5 5  Wrist flexion 5 5  Wrist extension 5 5  Wrist ulnar deviation    Wrist radial deviation    Wrist pronation    Wrist supination  Grip strength 51 54   (Blank rows = not tested)  CERVICAL SPECIAL TESTS:  Upper limb tension test (ULTT): will assess next visit and Spurling's test: will assess next visit  FUNCTIONAL TESTS:  5 times sit to stand: will assess next visit 10 meter walk test: Will assess next visit  TREATMENT DATE: PT EVALUATION OF CERVICAL SPINE- Will NEED TO TO CONTINUE WITH LOW BACK EVALUATION NEXT VISIT                                                                                                                                 PATIENT EDUCATION:  Education details: PT plan of care Person educated: Patient Education method: Explanation Education comprehension: verbalized understanding and returned demonstration  HOME EXERCISE PROGRAM: To be initiated next visit.   ASSESSMENT:  CLINICAL IMPRESSION: Patient is a 58 y.o. male who was seen today for physical therapy evaluation and treatment for Cervical and low back pain. Patient presents with painful cervical mobility and tenderness throughout upper cervical spine and into Bilateral Upper trap musculature. Will need to continue to assess low back next visit and add goals as appropriate. Pt will benefit from skilled PT services to address deficits and return to pain-free function at home and work.   OBJECTIVE IMPAIRMENTS: decreased activity tolerance, decreased endurance, decreased mobility, difficulty walking, decreased ROM, decreased strength, hypomobility, impaired flexibility, postural dysfunction, and pain.   ACTIVITY LIMITATIONS: carrying, lifting, bending, sitting, standing, squatting, sleeping, bed mobility, and reach over head  PARTICIPATION LIMITATIONS: cleaning, laundry, shopping, community activity, and yard  work  PERSONAL FACTORS: Time since onset of injury/illness/exacerbation and 1-2 comorbidities: multiple surgeries  are also affecting patient's functional outcome.   REHAB POTENTIAL: Good  CLINICAL DECISION MAKING: Evolving/moderate complexity  EVALUATION COMPLEXITY: Moderate   GOALS: Goals reviewed with patient? Yes  SHORT TERM GOALS: Target date: 03/18/2023  Pt will be independent with HEP in order to improve strength and decrease back pain in order to improve pain-free function at home and work. Baseline: EVAL- No formal HEP in place Goal status: INITIAL   LONG TERM GOALS: Target date: 04/28/2023  Pt will improve FOTO to target score of 46 to display perceived improvements in ability to complete ADL's.  Baseline: EVAL = 44 Goal status: INITIAL  2.  Pt will decrease worst back pain as reported on NPRS by at least 2 points in order to demonstrate clinically significant reduction in back pain.  Baseline: EVAL: 9-10 cervical and Lumbar pain at worst Goal status: INITIAL  3.  Patient will present with 10 deg improvement in cervical Rotation/sidebending for improved head mobility Baseline: EVAL: Right Rot= 50; left=48; right SB=22 and Left SB=24 deg Goal status: INITIAL  4.  Patient will report improved ability to stand and perform ADL > 1 hour without resting for improved ability to work.  Baseline: EVAL- Patient reports diffiuclty with prolonged standing and performing work functions Goal status: INITIAL  PLAN:  PT FREQUENCY: 1-2x/week  PT DURATION: 12 weeks  PLANNED INTERVENTIONS: [id number]- PT Re-evaluation, 97110-Therapeutic exercises, 97530- Therapeutic activity, 97112- Neuromuscular re-education, 97535- Self Care, 09811- Manual therapy, [phone]- Gait training, [phone]- Electrical stimulation (manual), Patient/Family education, Balance training, Stair training, Taping, Dry Needling, Joint mobilization, Joint manipulation, Spinal manipulation, Spinal mobilization, Scar  mobilization, Vestibular training, DME instructions, Cryotherapy, and Moist heat  PLAN FOR NEXT SESSION: Continue with evaluation of c-spine and low back, DN as appopriate, Manual therapy for pain relief and hypomobility, ROM activities and add to HEP as appropriate.    Lenda Kelp, PT 02/04/2023, 10:32 AM

## 2023-02-05 ENCOUNTER — Ambulatory Visit: Payer: Medicaid Other

## 2023-02-05 ENCOUNTER — Telehealth: Payer: Self-pay

## 2023-02-05 NOTE — Telephone Encounter (Signed)
Patient Name: Rodney Cisneros [facility] MRN: [id number] DOB:26-Jul-1965, 58 y.o., male Today's Date: 02/05/2023  Pt contacted via telephone and author left voice mail informing of missed appointment and informed pt of future PT appointment date and time.       Lenda Kelp, PT 02/05/2023, 3:29 PM

## 2023-02-10 ENCOUNTER — Ambulatory Visit: Payer: Medicaid Other

## 2023-02-10 DIAGNOSIS — M5442 Lumbago with sciatica, left side: Secondary | ICD-10-CM | POA: Diagnosis not present

## 2023-02-10 DIAGNOSIS — G8929 Other chronic pain: Secondary | ICD-10-CM

## 2023-02-10 NOTE — Therapy (Signed)
OUTPATIENT PHYSICAL THERAPY CERVICAL and LUMBAR  TREATMENT   Patient Name: Rodney Cisneros MRN: [id number] DOB:[birth year], 58 y.o., male Today's Date: 02/11/2023  END OF SESSION:  PT End of Session - 02/10/23 1614     Visit Number 2    Number of Visits 24    Date for PT Re-Evaluation 04/28/23    Progress Note Due on Visit 10    PT Start Time 1615    PT Stop Time 1700    PT Time Calculation (min) 45 min    Activity Tolerance No increased pain;Patient limited by pain    Behavior During Therapy Anxious             Past Medical History:  Diagnosis Date   Anxiety    Chronic ulcerative rectosigmoiditis, with rectal bleeding (HCC) 2016   Colitis    Depression    Diverticulosis    Epididymitis    GERD (gastroesophageal reflux disease)    Hypertension    Prostatitis    Rhinitis, allergic    Tubular adenoma 2017   Past Surgical History:  Procedure Laterality Date   BACK SURGERY  1991   laminectomy-L5   CHOLECYSTECTOMY N/A 09/23/2017   Procedure: LAPAROSCOPIC CHOLECYSTECTOMY;  Surgeon: Carolan Shiver, MD;  Location: [facility];  Service: General;  Laterality: N/A;   COLONOSCOPY     2016 and 2017   edg  2008   FUNCTIONAL ENDOSCOPIC SINUS SURGERY     LUMBAR LAMINECTOMY/ DECOMPRESSION WITH MET-RX Left 03/13/2022   Procedure: LUMBAR LAMINECTOMY/ DECOMPRESSION WITH MET-RX;  Surgeon: Venetia Night, MD;  Location: [facility] OR[facility];  Service: Neurosurgery;  Laterality: Left;  lumbar laminectomy L2-3 for resection of epidural lesion   TONSILLECTOMY     Patient Active Problem List   Diagnosis Date Noted   Cauda equina syndrome (HCC) 03/13/2022   Epidural hematoma (HCC) 03/13/2022   Lumbar stenosis 03/13/2022   Lumbar epidural mass (HCC) 03/13/2022   Tubular adenoma 10/29/2015   Ulcerative rectosigmoiditis with rectal bleeding (HCC) 03/20/2014   Essential hypertension 02/01/2014   GERD without esophagitis 02/01/2014    PCP: Dr. Bethann Punches   REFERRING PROVIDER: Dr.  Venetia Night  REFERRING DIAG:  M54.16 (ICD-10-CM) - Lumbar radiculopathy  M54.12 (ICD-10-CM) - Cervical radiculopathy  M48.02 (ICD-10-CM) - Cervical stenosis of spinal canal  M48.062 (ICD-10-CM) - Spinal stenosis of lumbar region with neurogenic claudication    THERAPY DIAG:  Chronic bilateral low back pain with left-sided sciatica  Chronic bilateral low back pain with right-sided sciatica  Rationale for Evaluation and Treatment: Rehabilitation  ONSET DATE: > 6 months ago  SUBJECTIVE:  SUBJECTIVE STATEMENT: Today: Patient reports pain is the same and states he is having some numbness and tingling into each hand.    From initial Evaluation:Patient reports having cervical and lumbar pain that is progressive. Reports went to neurosurgeon and prescribed PT. States multiple spinal surgeries - most recently on 03/13/2022- L2-3 lumbar laminectomy for resection of spinal mass. He reports pain in neck that can radiate into hands and pain in low back that can radiate down each LE. He has reduced his hours at work from 40 down to approx 18 hours per week.     Hand dominance: Right  PERTINENT HISTORY:  Past spinal surgery- L2-3 lumbar laminectomy for resection of spinal mass. Previous lumbar surgery in 1991. See attached for PMH  PAIN:  Are you having pain? Yes: NPRS scale: 6/10 current LBP; 3/10 at best; 9/10 at worst;  Cervical- 6/10 current; 10/10 at worst; best- 4/10 Pain location: Cervical and Lumbar with sciatica down to knees and sometimes down to feet.  Pain description: Achy with sharp pain.  Aggravating factors: prolonged standing/leaning/  Relieving factors: rest  PRECAUTIONS: None  RED FLAGS: Spinal tumors: Yes: Removed in 02/2022      WEIGHT BEARING RESTRICTIONS:  No  FALLS:  Has patient fallen in last 6 months? No  LIVING ENVIRONMENT: Lives with: lives with their family Lives in: House/apartment Stairs: Yes: External: 8 in front  and 3 in back steps; none Has following equipment at home: None  OCCUPATION: Production designer, theatre/television/film at [employer]  PLOF: Independent  PATIENT GOALS: I want to feel better by 50-75%   NEXT MD VISIT: 02/12/2023- Dr. Myer Haff  OBJECTIVE:  Note: Objective measures were completed at Evaluation unless otherwise noted.  DIAGNOSTIC FINDINGS:  MRI CL spine 11/01/2022 IMPRESSION: 1. Successful decompression at L2-3 with no recurrent epidural lesion or thecal sac stenosis. Chronic left foraminal protrusion at this level with patent foramen. 2. L3-4 moderate narrowing of the thecal sac due to degeneration, epidural fat, and short pedicles. 3. L5-S1 chronic moderate left foraminal stenosis from degeneration.    MRI from [facility] dated 11/01/2022 both images and report were reviewed today.  EXAM:  MRI CERVICAL SPINE WITHOUT CONTRAST    Electronically Signed   By: Tiburcio Pea M.D.   On: 11/26/2022 18:11   IMPRESSION: 1. At least moderate severity bilateral degenerative foraminal impingement at C3-4 to C6-7, sparing the left foramen at C5-6. 2. Diffusely patent spinal canal.     Electronically Signed   By: Tiburcio Pea M.D.   On: 11/26/2022 18:05  PATIENT SURVEYS:  FOTO 44 with goal of 46  COGNITION: Overall cognitive status: Within functional limits for tasks assessed  SENSATION: WFL  POSTURE: rounded shoulders and forward head  PALPATION: (+) tenderness along occiput and B upper trap    CERVICAL ROM:   Active ROM A/PROM (deg) eval  Flexion Chin to chest * painful  Extension 62  Right lateral flexion 22  Left lateral flexion 24  Right rotation 50  Left rotation 48   (Blank rows = not tested)   UPPER EXTREMITY MMT:  MMT Right eval Left eval  Shoulder flexion 5 5  Shoulder extension     Shoulder abduction 5 5  Shoulder adduction    Shoulder extension 5 5  Shoulder internal rotation    Shoulder external rotation 5 5  Middle trapezius    Lower trapezius    Elbow flexion 5 5  Elbow extension 5 5  Wrist flexion 5 5  Wrist extension 5  5  Wrist ulnar deviation    Wrist radial deviation    Wrist pronation    Wrist supination    Grip strength 51 54   (Blank rows = not tested)  CERVICAL SPECIAL TESTS:  Upper limb tension test (ULTT): will assess next visit and Spurling's test: will assess next visit  FUNCTIONAL TESTS:  5 times sit to stand: will assess next visit 10 meter walk test: Will assess next visit  TREATMENT DATE:                                                                                                         Low back assessment:  Posture Lumbar lordosis: flat Iliac crest height: equal bilaterally Lumbar lateral shift: negative    AROM (degrees) R/L (all movements include overpressure unless otherwise stated) Lumbar forward flexion : Fingertips to tibial tuberosity *pain Lumbar extension: *pain with active attempt Lumbar lateral flexion: R: fingertip to fibular head L: fingertip to mid calf * painful with each       Repeated Movements No centralization or peripheralization of symptoms with repeated lumbar extension or flexion but (+) for pain with each   Strength (out of 5) R/L 4/3+ Hip flexion* 4/4 Hip ER* 4/3+ Hip IR* 4/3+ Hip abduction* 5/4 Knee extension* 5/5 Knee flexion 5/4 Ankle dorsiflexion *Indicates pain   Sensation Grossly intact to light touch bilateral LEs as determined by testing dermatomes L2-S2. Proprioception and hot/cold testing deferred on this date.     SPECIAL TESTS Lumbar Radiculopathy and Discogenic: Centralization and Peripheralization (SN 92, -LR 0.12): Negative Slump (SN 83, -LR 0.32): R: Positive L: Positive SLR (SN 92, -LR 0.29): R: Positive L:  Positive  Instructed in some beginner neck  ROM After measuring cervical Rot L= 45 and right 38 deg and SB- L= 20; R= 18 - Seated chin tuck- 10 reps (add to HEP) -Assisted cervical Rot-using towel- hold 20-30 sec x 2 each  -Seated UT stretch- hold 20 sec x 2 each Side.  Trigger Point Dry Needling  Initial Treatment: Pt instructed on Dry Needling rational, procedures, and possible side effects. Pt instructed to expect mild to moderate muscle soreness later in the day and/or into the next day.  Pt instructed in methods to reduce muscle soreness. Pt instructed to continue prescribed HEP. Because Dry Needling was performed over or adjacent to a lung field, pt was educated on S/S of pneumothorax and to seek immediate medical attention should they occur.  Patient was educated on signs and symptoms of infection and other risk factors and advised to seek medical attention should they occur.  Patient verbalized understanding of these instructions and education.   Patient Verbal Consent Given: Yes Education Handout Provided: Yes Muscles Treated: Right sided cervical paraspinals/UT region Electrical Stimulation Performed: No Treatment Response/Outcome: 1 LTR with right sided UT and no immediate adverse reactions.         PATIENT EDUCATION:  Education details: PT plan of care Person educated: Patient Education method: Explanation Education comprehension: verbalized understanding and returned demonstration  HOME EXERCISE PROGRAM:  Access  Code: [email or web address] URL: [email or web address] Date: 02/11/2023 Prepared by: Maureen Ralphs  Exercises - Seated Assisted Cervical Rotation with Towel  - 1 x daily - 3 sets - 10-30 sec hold - Supine Chin Tuck  - 1 x daily - 3 sets - 10 reps - 3-5 sec hold - Seated Cervical Sidebending Stretch  - 1 x daily - 3 sets - 20-30 hold  ASSESSMENT:  CLINICAL IMPRESSION: Patient presents with good motivation for visit 2. Conducted further assessment of low back today and patient presents  with constant pain with some radicular symptoms and hypomobility throughout lumbar spine and LE's. Treatment then returned to focus on cervical pain in immobility. Patient was agreeable to trigger point dry needling and several taut bands along right Upper trap. He did report some immediate relief after session. He was instructed in beginner stretching for c-spine and will benefit from further treatment of pain and dysfunction along spine. He completed the Modified Oswestry and goal added to improve his disability rating.  Pt will benefit from skilled PT services to address deficits and return to pain-free function at home and work.   OBJECTIVE IMPAIRMENTS: decreased activity tolerance, decreased endurance, decreased mobility, difficulty walking, decreased ROM, decreased strength, hypomobility, impaired flexibility, postural dysfunction, and pain.   ACTIVITY LIMITATIONS: carrying, lifting, bending, sitting, standing, squatting, sleeping, bed mobility, and reach over head  PARTICIPATION LIMITATIONS: cleaning, laundry, shopping, community activity, and yard work  PERSONAL FACTORS: Time since onset of injury/illness/exacerbation and 1-2 comorbidities: multiple surgeries  are also affecting patient's functional outcome.   REHAB POTENTIAL: Good  CLINICAL DECISION MAKING: Evolving/moderate complexity  EVALUATION COMPLEXITY: Moderate   GOALS: Goals reviewed with patient? Yes  SHORT TERM GOALS: Target date: 03/18/2023  Pt will be independent with HEP in order to improve strength and decrease back pain in order to improve pain-free function at home and work. Baseline: EVAL- No formal HEP in place Goal status: INITIAL   LONG TERM GOALS: Target date: 04/28/2023  Pt will improve FOTO to target score of 46 to display perceived improvements in ability to complete ADL's.  Baseline: EVAL = 44 Goal status: INITIAL  2.  Pt will decrease worst back pain as reported on NPRS by at least 2 points in order to  demonstrate clinically significant reduction in back pain.  Baseline: EVAL: 9-10 cervical and Lumbar pain at worst Goal status: INITIAL  3.  Patient will present with 10 deg improvement in cervical Rotation/sidebending for improved head mobility Baseline: EVAL: Right Rot= 50; left=48; right SB=22 and Left SB=24 deg Goal status: INITIAL  4.  Patient will report improved ability to stand and perform ADL > 1 hour without resting for improved ability to work.  Baseline: EVAL- Patient reports diffiuclty with prolonged standing and performing work functions Goal status: INITIAL   5. Pt will decrease mODI score by at least 13 points in order demonstrate clinically significant reduction in back pain/disability.     Baseline: 02/10/2023= 66%  Goal status: New   PLAN:  PT FREQUENCY: 1-2x/week  PT DURATION: 12 weeks  PLANNED INTERVENTIONS: 97164- PT Re-evaluation, 97110-Therapeutic exercises, 97530- Therapeutic activity, 97112- Neuromuscular re-education, 97535- Self Care, 84696- Manual therapy, [phone]- Gait training, [phone]- Electrical stimulation (manual), Patient/Family education, Balance training, Stair training, Taping, Dry Needling, Joint mobilization, Joint manipulation, Spinal manipulation, Spinal mobilization, Scar mobilization, Vestibular training, DME instructions, Cryotherapy, and Moist heat  PLAN FOR NEXT SESSION: DN as appopriate, Manual therapy for pain relief and hypomobility, ROM activities and add to HEP as appropriate.  Lenda Kelp, PT 02/11/2023, 9:09 AM

## 2023-02-11 ENCOUNTER — Encounter: Payer: Medicaid Other | Admitting: Physical Therapy

## 2023-02-12 ENCOUNTER — Ambulatory Visit: Payer: Medicaid Other | Admitting: Neurosurgery

## 2023-02-12 ENCOUNTER — Ambulatory Visit: Payer: Medicaid Other | Admitting: Physical Therapy

## 2023-02-12 ENCOUNTER — Telehealth: Payer: Self-pay | Admitting: Physical Therapy

## 2023-02-12 NOTE — Telephone Encounter (Signed)
Called pt regarding missed appointment. Pt reports his work schedule changed and he was unable to make the appointment.   Norman Herrlich  ,DPT Physical Therapist- [place]  [facility]

## 2023-02-12 NOTE — Therapy (Incomplete)
OUTPATIENT PHYSICAL THERAPY CERVICAL and LUMBAR  TREATMENT   Patient Name: Rodney Cisneros MRN: [id number] DOB:[birth year], 58 y.o., male Today's Date: 02/12/2023  END OF SESSION:    Past Medical History:  Diagnosis Date   Anxiety    Chronic ulcerative rectosigmoiditis, with rectal bleeding (HCC) 2016   Colitis    Depression    Diverticulosis    Epididymitis    GERD (gastroesophageal reflux disease)    Hypertension    Prostatitis    Rhinitis, allergic    Tubular adenoma 2017   Past Surgical History:  Procedure Laterality Date   BACK SURGERY  1991   laminectomy-L5   CHOLECYSTECTOMY N/A 09/23/2017   Procedure: LAPAROSCOPIC CHOLECYSTECTOMY;  Surgeon: Carolan Shiver, MD;  Location: [facility] ORS;  Service: General;  Laterality: N/A;   COLONOSCOPY     2016 and 2017   edg  2008   FUNCTIONAL ENDOSCOPIC SINUS SURGERY     LUMBAR LAMINECTOMY/ DECOMPRESSION WITH MET-RX Left 03/13/2022   Procedure: LUMBAR LAMINECTOMY/ DECOMPRESSION WITH MET-RX;  Surgeon: Venetia Night, MD;  Location: [facility] ORS;  Service: Neurosurgery;  Laterality: Left;  lumbar laminectomy L2-3 for resection of epidural lesion   TONSILLECTOMY     Patient Active Problem List   Diagnosis Date Noted   Cauda equina syndrome (HCC) 03/13/2022   Epidural hematoma (HCC) 03/13/2022   Lumbar stenosis 03/13/2022   Lumbar epidural mass (HCC) 03/13/2022   Tubular adenoma 10/29/2015   Ulcerative rectosigmoiditis with rectal bleeding (HCC) 03/20/2014   Essential hypertension 02/01/2014   GERD without esophagitis 02/01/2014    PCP: Dr. Bethann Punches   REFERRING PROVIDER: Dr. Venetia Night  REFERRING DIAG:  M54.16 (ICD-10-CM) - Lumbar radiculopathy  M54.12 (ICD-10-CM) - Cervical radiculopathy  M48.02 (ICD-10-CM) - Cervical stenosis of spinal canal  M48.062 (ICD-10-CM) - Spinal stenosis of lumbar region with neurogenic claudication    THERAPY DIAG:  Chronic bilateral low back pain with left-sided  sciatica  Chronic bilateral low back pain with right-sided sciatica  Rationale for Evaluation and Treatment: Rehabilitation  ONSET DATE: > 6 months ago  SUBJECTIVE:                                                                                                                                                                                                         SUBJECTIVE STATEMENT: Today: Patient reports pain is the same and states he is having some numbness and tingling into each hand.    From initial Evaluation:Patient reports having cervical and lumbar pain that is progressive. Reports went to neurosurgeon and prescribed PT. States multiple  spinal surgeries - most recently on 03/13/2022- L2-3 lumbar laminectomy for resection of spinal mass. He reports pain in neck that can radiate into hands and pain in low back that can radiate down each LE. He has reduced his hours at work from 40 down to approx 18 hours per week.     Hand dominance: Right  PERTINENT HISTORY:  Past spinal surgery- L2-3 lumbar laminectomy for resection of spinal mass. Previous lumbar surgery in 1991. See attached for PMH  PAIN:  Are you having pain? Yes: NPRS scale: 6/10 current LBP; 3/10 at best; 9/10 at worst;  Cervical- 6/10 current; 10/10 at worst; best- 4/10 Pain location: Cervical and Lumbar with sciatica down to knees and sometimes down to feet.  Pain description: Achy with sharp pain.  Aggravating factors: prolonged standing/leaning/  Relieving factors: rest  PRECAUTIONS: None  RED FLAGS: Spinal tumors: Yes: Removed in 02/2022      WEIGHT BEARING RESTRICTIONS: No  FALLS:  Has patient fallen in last 6 months? No  LIVING ENVIRONMENT: Lives with: lives with their family Lives in: House/apartment Stairs: Yes: External: 8 in front  and 3 in back steps; none Has following equipment at home: None  OCCUPATION: Production designer, theatre/television/film at [employer]  PLOF: Independent  PATIENT GOALS: I want to feel better by  50-75%   NEXT MD VISIT: 02/12/2023- Dr. Myer Haff  OBJECTIVE:  Note: Objective measures were completed at Evaluation unless otherwise noted.  DIAGNOSTIC FINDINGS:  MRI CL spine 11/01/2022 IMPRESSION: 1. Successful decompression at L2-3 with no recurrent epidural lesion or thecal sac stenosis. Chronic left foraminal protrusion at this level with patent foramen. 2. L3-4 moderate narrowing of the thecal sac due to degeneration, epidural fat, and short pedicles. 3. L5-S1 chronic moderate left foraminal stenosis from degeneration.    MRI from [facility] dated 11/01/2022 both images and report were reviewed today.  EXAM:  MRI CERVICAL SPINE WITHOUT CONTRAST    Electronically Signed   By: Tiburcio Pea M.D.   On: 11/26/2022 18:11   IMPRESSION: 1. At least moderate severity bilateral degenerative foraminal impingement at C3-4 to C6-7, sparing the left foramen at C5-6. 2. Diffusely patent spinal canal.     Electronically Signed   By: Tiburcio Pea M.D.   On: 11/26/2022 18:05  PATIENT SURVEYS:  FOTO 44 with goal of 46  COGNITION: Overall cognitive status: Within functional limits for tasks assessed  SENSATION: WFL  POSTURE: rounded shoulders and forward head  PALPATION: (+) tenderness along occiput and B upper trap    CERVICAL ROM:   Active ROM A/PROM (deg) eval  Flexion Chin to chest * painful  Extension 62  Right lateral flexion 22  Left lateral flexion 24  Right rotation 50  Left rotation 48   (Blank rows = not tested)   UPPER EXTREMITY MMT:  MMT Right eval Left eval  Shoulder flexion 5 5  Shoulder extension    Shoulder abduction 5 5  Shoulder adduction    Shoulder extension 5 5  Shoulder internal rotation    Shoulder external rotation 5 5  Middle trapezius    Lower trapezius    Elbow flexion 5 5  Elbow extension 5 5  Wrist flexion 5 5  Wrist extension 5 5  Wrist ulnar deviation    Wrist radial deviation    Wrist pronation    Wrist supination     Grip strength 51 54   (Blank rows = not tested)  CERVICAL SPECIAL TESTS:  Upper limb tension test (ULTT):  will assess next visit and Spurling's test: will assess next visit  FUNCTIONAL TESTS:  5 times sit to stand: will assess next visit 10 meter walk test: Will assess next visit  TREATMENT DATE:                                                                                                         Low back assessment:  Posture Lumbar lordosis: flat Iliac crest height: equal bilaterally Lumbar lateral shift: negative    AROM (degrees) R/L (all movements include overpressure unless otherwise stated) Lumbar forward flexion : Fingertips to tibial tuberosity *pain Lumbar extension: *pain with active attempt Lumbar lateral flexion: R: fingertip to fibular head L: fingertip to mid calf * painful with each       Repeated Movements No centralization or peripheralization of symptoms with repeated lumbar extension or flexion but (+) for pain with each   Strength (out of 5) R/L 4/3+ Hip flexion* 4/4 Hip ER* 4/3+ Hip IR* 4/3+ Hip abduction* 5/4 Knee extension* 5/5 Knee flexion 5/4 Ankle dorsiflexion *Indicates pain   Sensation Grossly intact to light touch bilateral LEs as determined by testing dermatomes L2-S2. Proprioception and hot/cold testing deferred on this date.     SPECIAL TESTS Lumbar Radiculopathy and Discogenic: Centralization and Peripheralization (SN 92, -LR 0.12): Negative Slump (SN 83, -LR 0.32): R: Positive L: Positive SLR (SN 92, -LR 0.29): R: Positive L:  Positive  Instructed in some beginner neck ROM After measuring cervical Rot L= 45 and right 38 deg and SB- L= 20; R= 18 - Seated chin tuck- 10 reps (add to HEP) -Assisted cervical Rot-using towel- hold 20-30 sec x 2 each  -Seated UT stretch- hold 20 sec x 2 each Side.  Trigger Point Dry Needling  Initial Treatment: Pt instructed on Dry Needling rational, procedures, and possible side  effects. Pt instructed to expect mild to moderate muscle soreness later in the day and/or into the next day.  Pt instructed in methods to reduce muscle soreness. Pt instructed to continue prescribed HEP. Because Dry Needling was performed over or adjacent to a lung field, pt was educated on S/S of pneumothorax and to seek immediate medical attention should they occur.  Patient was educated on signs and symptoms of infection and other risk factors and advised to seek medical attention should they occur.  Patient verbalized understanding of these instructions and education.   Patient Verbal Consent Given: Yes Education Handout Provided: Yes Muscles Treated: Right sided cervical paraspinals/UT region Electrical Stimulation Performed: No Treatment Response/Outcome: 1 LTR with right sided UT and no immediate adverse reactions.         PATIENT EDUCATION:  Education details: PT plan of care Person educated: Patient Education method: Explanation Education comprehension: verbalized understanding and returned demonstration  HOME EXERCISE PROGRAM:  Access Code: [email or web address] URL: [email or web address] Date: 02/11/2023 Prepared by: Maureen Ralphs  Exercises - Seated Assisted Cervical Rotation with Towel  - 1 x daily - 3 sets - 10-30 sec hold - Supine Chin Tuck  - 1 x daily - 3 sets - 10  reps - 3-5 sec hold - Seated Cervical Sidebending Stretch  - 1 x daily - 3 sets - 20-30 hold  ASSESSMENT:  CLINICAL IMPRESSION: Patient presents with good motivation for visit 2. Conducted further assessment of low back today and patient presents with constant pain with some radicular symptoms and hypomobility throughout lumbar spine and LE's. Treatment then returned to focus on cervical pain in immobility. Patient was agreeable to trigger point dry needling and several taut bands along right Upper trap. He did report some immediate relief after session. He was instructed in beginner stretching  for c-spine and will benefit from further treatment of pain and dysfunction along spine. He completed the Modified Oswestry and goal added to improve his disability rating.  Pt will benefit from skilled PT services to address deficits and return to pain-free function at home and work.   OBJECTIVE IMPAIRMENTS: decreased activity tolerance, decreased endurance, decreased mobility, difficulty walking, decreased ROM, decreased strength, hypomobility, impaired flexibility, postural dysfunction, and pain.   ACTIVITY LIMITATIONS: carrying, lifting, bending, sitting, standing, squatting, sleeping, bed mobility, and reach over head  PARTICIPATION LIMITATIONS: cleaning, laundry, shopping, community activity, and yard work  PERSONAL FACTORS: Time since onset of injury/illness/exacerbation and 1-2 comorbidities: multiple surgeries  are also affecting patient's functional outcome.   REHAB POTENTIAL: Good  CLINICAL DECISION MAKING: Evolving/moderate complexity  EVALUATION COMPLEXITY: Moderate   GOALS: Goals reviewed with patient? Yes  SHORT TERM GOALS: Target date: 03/18/2023  Pt will be independent with HEP in order to improve strength and decrease back pain in order to improve pain-free function at home and work. Baseline: EVAL- No formal HEP in place Goal status: INITIAL   LONG TERM GOALS: Target date: 04/28/2023  Pt will improve FOTO to target score of 46 to display perceived improvements in ability to complete ADL's.  Baseline: EVAL = 44 Goal status: INITIAL  2.  Pt will decrease worst back pain as reported on NPRS by at least 2 points in order to demonstrate clinically significant reduction in back pain.  Baseline: EVAL: 9-10 cervical and Lumbar pain at worst Goal status: INITIAL  3.  Patient will present with 10 deg improvement in cervical Rotation/sidebending for improved head mobility Baseline: EVAL: Right Rot= 50; left=48; right SB=22 and Left SB=24 deg Goal status: INITIAL  4.   Patient will report improved ability to stand and perform ADL > 1 hour without resting for improved ability to work.  Baseline: EVAL- Patient reports diffiuclty with prolonged standing and performing work functions Goal status: INITIAL   5. Pt will decrease mODI score by at least 13 points in order demonstrate clinically significant reduction in back pain/disability.     Baseline: 02/10/2023= 66%  Goal status: New   PLAN:  PT FREQUENCY: 1-2x/week  PT DURATION: 12 weeks  PLANNED INTERVENTIONS: 97164- PT Re-evaluation, 97110-Therapeutic exercises, 97530- Therapeutic activity, 97112- Neuromuscular re-education, 97535- Self Care, 16109- Manual therapy, [phone]- Gait training, [phone]- Electrical stimulation (manual), Patient/Family education, Balance training, Stair training, Taping, Dry Needling, Joint mobilization, Joint manipulation, Spinal manipulation, Spinal mobilization, Scar mobilization, Vestibular training, DME instructions, Cryotherapy, and Moist heat  PLAN FOR NEXT SESSION: DN as appopriate, Manual therapy for pain relief and hypomobility, ROM activities and add to HEP as appropriate.    Norman Herrlich, PT 02/12/2023, 8:39 AM

## 2023-02-17 ENCOUNTER — Ambulatory Visit: Payer: Medicaid Other | Attending: Neurosurgery

## 2023-02-17 DIAGNOSIS — G8929 Other chronic pain: Secondary | ICD-10-CM | POA: Diagnosis present

## 2023-02-17 DIAGNOSIS — M542 Cervicalgia: Secondary | ICD-10-CM | POA: Insufficient documentation

## 2023-02-17 DIAGNOSIS — M5442 Lumbago with sciatica, left side: Secondary | ICD-10-CM | POA: Insufficient documentation

## 2023-02-17 DIAGNOSIS — M5441 Lumbago with sciatica, right side: Secondary | ICD-10-CM | POA: Diagnosis present

## 2023-02-17 NOTE — Therapy (Signed)
 OUTPATIENT PHYSICAL THERAPY CERVICAL and LUMBAR  TREATMENT   Patient Name: Rodney Cisneros MRN: [id number] DOB:May 14, 1965, 58 y.o., male Today's Date: 02/17/2023  END OF SESSION:  PT End of Session - 02/17/23 1617     Visit Number 3    Number of Visits 24    Date for PT Re-Evaluation 04/28/23    Progress Note Due on Visit 10    PT Start Time 1620    PT Stop Time 1701    PT Time Calculation (min) 41 min    Activity Tolerance Patient limited by pain    Behavior During Therapy [facility] for tasks assessed/performed              Past Medical History:  Diagnosis Date   Anxiety    Chronic ulcerative rectosigmoiditis, with rectal bleeding (HCC) 2016   Colitis    Depression    Diverticulosis    Epididymitis    GERD (gastroesophageal reflux disease)    Hypertension    Prostatitis    Rhinitis, allergic    Tubular adenoma 2017   Past Surgical History:  Procedure Laterality Date   BACK SURGERY  1991   laminectomy-L5   CHOLECYSTECTOMY N/A 09/23/2017   Procedure: LAPAROSCOPIC CHOLECYSTECTOMY;  Surgeon: Rodolph Romano, MD;  Location: [facility] ORS;  Service: General;  Laterality: N/A;   COLONOSCOPY     2016 and 2017   edg  2008   FUNCTIONAL ENDOSCOPIC SINUS SURGERY     LUMBAR LAMINECTOMY/ DECOMPRESSION WITH MET-RX Left 03/13/2022   Procedure: LUMBAR LAMINECTOMY/ DECOMPRESSION WITH MET-RX;  Surgeon: Clois Fret, MD;  Location: [facility] ORS;  Service: Neurosurgery;  Laterality: Left;  lumbar laminectomy L2-3 for resection of epidural lesion   TONSILLECTOMY     Patient Active Problem List   Diagnosis Date Noted   Cauda equina syndrome (HCC) 03/13/2022   Epidural hematoma (HCC) 03/13/2022   Lumbar stenosis 03/13/2022   Lumbar epidural mass (HCC) 03/13/2022   Tubular adenoma 10/29/2015   Ulcerative rectosigmoiditis with rectal bleeding (HCC) 03/20/2014   Essential hypertension 02/01/2014   GERD without esophagitis 02/01/2014    PCP: Dr. Oneil Pinal   REFERRING PROVIDER:  Dr. Fret Clois  REFERRING DIAG:  M54.16 (ICD-10-CM) - Lumbar radiculopathy  M54.12 (ICD-10-CM) - Cervical radiculopathy  M48.02 (ICD-10-CM) - Cervical stenosis of spinal canal  M48.062 (ICD-10-CM) - Spinal stenosis of lumbar region with neurogenic claudication    THERAPY DIAG:  Cervicalgia  Chronic bilateral low back pain with right-sided sciatica  Rationale for Evaluation and Treatment: Rehabilitation  ONSET DATE: > 6 months ago  SUBJECTIVE:  SUBJECTIVE STATEMENT: Pt reports soreness in shoulders, neck, LLE and lower back. Pt rates it as moderate. Pt reports tingling in hands has increased. Pt says he's going to wait two more days to give it a full week to see if it stops, then will call Dr.  Clois.  Pt reports dry needling briefly helped the tingling, but then it came back.     From initial Evaluation:Patient reports having cervical and lumbar pain that is progressive. Reports went to neurosurgeon and prescribed PT. States multiple spinal surgeries - most recently on 03/13/2022- L2-3 lumbar laminectomy for resection of spinal mass. He reports pain in neck that can radiate into hands and pain in low back that can radiate down each LE. He has reduced his hours at work from 40 down to approx 18 hours per week.     Hand dominance: Right  PERTINENT HISTORY:  Past spinal surgery- L2-3 lumbar laminectomy for resection of spinal mass. Previous lumbar surgery in 1991. See attached for PMH  PAIN:  Are you having pain? Yes: NPRS scale: 6/10 current LBP; 3/10 at best; 9/10 at worst;  Cervical- 6/10 current; 10/10 at worst; best- 4/10 Pain location: Cervical and Lumbar with sciatica down to knees and sometimes down to feet.  Pain description: Achy with sharp pain.  Aggravating  factors: prolonged standing/leaning/  Relieving factors: rest  PRECAUTIONS: None  RED FLAGS: Spinal tumors: Yes: Removed in 02/2022      WEIGHT BEARING RESTRICTIONS: No  FALLS:  Has patient fallen in last 6 months? No  LIVING ENVIRONMENT: Lives with: lives with their family Lives in: House/apartment Stairs: Yes: External: 8 in front  and 3 in back steps; none Has following equipment at home: None  OCCUPATION: production designer, theatre/television/film at [employer]  PLOF: Independent  PATIENT GOALS: I want to feel better by 50-75%   NEXT MD VISIT: 02/12/2023- Dr. Clois  OBJECTIVE:  Note: Objective measures were completed at Evaluation unless otherwise noted.  DIAGNOSTIC FINDINGS:  MRI CL spine 11/01/2022 IMPRESSION: 1. Successful decompression at L2-3 with no recurrent epidural lesion or thecal sac stenosis. Chronic left foraminal protrusion at this level with patent foramen. 2. L3-4 moderate narrowing of the thecal sac due to degeneration, epidural fat, and short pedicles. 3. L5-S1 chronic moderate left foraminal stenosis from degeneration.    MRI from [facility] dated 11/01/2022 both images and report were reviewed today.  EXAM:  MRI CERVICAL SPINE WITHOUT CONTRAST    Electronically Signed   By: Dorn Roulette M.D.   On: 11/26/2022 18:11   IMPRESSION: 1. At least moderate severity bilateral degenerative foraminal impingement at C3-4 to C6-7, sparing the left foramen at C5-6. 2. Diffusely patent spinal canal.     Electronically Signed   By: Dorn Roulette M.D.   On: 11/26/2022 18:05  PATIENT SURVEYS:  FOTO 44 with goal of 46  COGNITION: Overall cognitive status: Within functional limits for tasks assessed  SENSATION: WFL  POSTURE: rounded shoulders and forward head  PALPATION: (+) tenderness along occiput and B upper trap    CERVICAL ROM:   Active ROM A/PROM (deg) eval  Flexion Chin to chest * painful  Extension 62  Right lateral flexion 22  Left lateral flexion 24   Right rotation 50  Left rotation 48   (Blank rows = not tested)   UPPER EXTREMITY MMT:  MMT Right eval Left eval  Shoulder flexion 5 5  Shoulder extension    Shoulder abduction 5 5  Shoulder adduction    Shoulder  extension 5 5  Shoulder internal rotation    Shoulder external rotation 5 5  Middle trapezius    Lower trapezius    Elbow flexion 5 5  Elbow extension 5 5  Wrist flexion 5 5  Wrist extension 5 5  Wrist ulnar deviation    Wrist radial deviation    Wrist pronation    Wrist supination    Grip strength 51 54   (Blank rows = not tested)  CERVICAL SPECIAL TESTS:  Upper limb tension test (ULTT): will assess next visit and Spurling's test: will assess next visit  FUNCTIONAL TESTS:  5 times sit to stand: will assess next visit 10 meter walk test: Will assess next visit  TREATMENT DATE:                                                                                                         TA: UT stretch 2x30 sec each side Seated chin tuck 1x - discontinued, pt reports spinning, took a couple seconds, no nystagmus observed, resolved once out of position. 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 Attempted STM to mm of occipital triangle, but this made pt feel dizzy, immediately discontinued Primary TrP present in R UT, this seems to elicit increased tingling bilaterally, pt otherwise reported manual felt good to mm - cuing to perform with gentle UT and and cervical rotation  stretch  Self-Care/home management: PT instructs pt to contact his physician due to ongoing and increasing bilat UE tingling, signs/sx with interventions and at rest. Pt agreeable to plan, reports will call Dr. Clois tomorrow.  PATIENT EDUCATION:  Education details: PT plan of care Person educated: Patient Education method: Explanation Education comprehension: verbalized understanding and returned demonstration  HOME EXERCISE PROGRAM:  Access Code: [email or web address] URL: [email or web address] Date: 02/11/2023 Prepared by: Reyes London  Exercises - Seated Assisted Cervical Rotation with Towel  - 1 x daily - 3 sets - 10-30 sec hold - Supine Chin Tuck  - 1 x daily - 3 sets - 10 reps - 3-5 sec hold - Seated Cervical Sidebending Stretch  - 1 x daily - 3 sets - 20-30 hold  ASSESSMENT:  CLINICAL IMPRESSION: Majority of interventions limited by pain and/or tingling in bilat UE. Due to ongoing and increasing bilat UE tingling per pt report, PT instructs pt to contact Dr. Clois for further assessment.  Pt will benefit from skilled PT services to address deficits and return to pain-free function at home and work.   OBJECTIVE IMPAIRMENTS: decreased activity tolerance, decreased endurance, decreased mobility, difficulty walking, decreased ROM, decreased  strength, hypomobility, impaired flexibility, postural dysfunction, and pain.   ACTIVITY LIMITATIONS: carrying, lifting, bending, sitting, standing, squatting, sleeping, bed mobility, and reach over head  PARTICIPATION LIMITATIONS: cleaning, laundry, shopping, community activity, and yard work  PERSONAL FACTORS: Time since onset of injury/illness/exacerbation and 1-2 comorbidities: multiple surgeries  are also affecting patient's functional outcome.   REHAB POTENTIAL: Good  CLINICAL DECISION MAKING: Evolving/moderate complexity  EVALUATION COMPLEXITY: Moderate   GOALS: Goals reviewed with patient? Yes  SHORT TERM  GOALS: Target date: 03/18/2023  Pt will be independent with HEP in order to improve strength and decrease back pain in order to improve pain-free function at home and work. Baseline: EVAL- No formal HEP in place Goal status: INITIAL   LONG TERM GOALS: Target date: 04/28/2023  Pt will improve FOTO to target score of 46 to display perceived improvements in ability to complete ADL's.  Baseline: EVAL = 44 Goal status: INITIAL  2.  Pt will decrease worst back pain as reported on NPRS by at least 2 points in order to demonstrate clinically significant reduction in back pain.  Baseline: EVAL: 9-10 cervical and Lumbar pain at worst Goal status: INITIAL  3.  Patient will present with 10 deg improvement in cervical Rotation/sidebending for improved head mobility Baseline: EVAL: Right Rot= 50; left=48; right SB=22 and Left SB=24 deg Goal status: INITIAL  4.  Patient will report improved ability to stand and perform ADL > 1 hour without resting for improved ability to work.  Baseline: EVAL- Patient reports diffiuclty with prolonged standing and performing work functions Goal status: INITIAL   5. Pt will decrease mODI score by at least 13 points in order demonstrate clinically significant reduction in back pain/disability.     Baseline: 02/10/2023= 66%  Goal status: New   PLAN:  PT FREQUENCY: 1-2x/week  PT DURATION: 12 weeks  PLANNED INTERVENTIONS: 97164- PT Re-evaluation, 97110-Therapeutic exercises, 97530- Therapeutic activity, 97112- Neuromuscular re-education, 97535- Self Care, 02859- Manual therapy, [phone]- Gait training, [phone]- Electrical stimulation (manual), Patient/Family education, Balance training, Stair training, Taping, Dry Needling, Joint mobilization, Joint manipulation, Spinal manipulation, Spinal mobilization, Scar mobilization, Vestibular training, DME instructions, Cryotherapy, and Moist heat  PLAN FOR NEXT SESSION: DN as appopriate, Manual therapy for pain relief and  hypomobility, ROM activities and add to HEP as appropriate.    Darryle JONELLE Patten, PT 02/17/2023, 6:47 PM

## 2023-02-18 ENCOUNTER — Encounter: Payer: Medicaid Other | Admitting: Physical Therapy

## 2023-02-23 ENCOUNTER — Ambulatory Visit: Payer: Medicaid Other | Admitting: Physical Therapy

## 2023-02-23 ENCOUNTER — Encounter: Payer: Medicaid Other | Admitting: Physical Therapy

## 2023-02-25 ENCOUNTER — Ambulatory Visit: Payer: Medicaid Other | Admitting: Physical Therapy

## 2023-02-25 ENCOUNTER — Encounter: Payer: Medicaid Other | Admitting: Physical Therapy

## 2023-03-02 ENCOUNTER — Ambulatory Visit: Payer: Medicaid Other | Admitting: Physical Therapy

## 2023-03-02 ENCOUNTER — Encounter: Payer: Medicaid Other | Admitting: Physical Therapy

## 2023-03-02 DIAGNOSIS — M542 Cervicalgia: Secondary | ICD-10-CM | POA: Diagnosis not present

## 2023-03-02 DIAGNOSIS — G8929 Other chronic pain: Secondary | ICD-10-CM

## 2023-03-02 NOTE — Therapy (Signed)
OUTPATIENT PHYSICAL THERAPY CERVICAL and LUMBAR  TREATMENT   Patient Name: Rodney Cisneros MRN: [id number] DOB:1965/09/26, 58 y.o., male Today's Date: 03/02/2023  END OF SESSION:  PT End of Session - 03/02/23 1621     Visit Number 4    Number of Visits 24    Date for PT Re-Evaluation 04/28/23    Progress Note Due on Visit 10    PT Start Time 1618    PT Stop Time 1656    PT Time Calculation (min) 38 min    Activity Tolerance Patient limited by pain    Behavior During Therapy [facility] for tasks assessed/performed               Past Medical History:  Diagnosis Date   Anxiety    Chronic ulcerative rectosigmoiditis, with rectal bleeding (HCC) 2016   Colitis    Depression    Diverticulosis    Epididymitis    GERD (gastroesophageal reflux disease)    Hypertension    Prostatitis    Rhinitis, allergic    Tubular adenoma 2017   Past Surgical History:  Procedure Laterality Date   BACK SURGERY  1991   laminectomy-L5   CHOLECYSTECTOMY N/A 09/23/2017   Procedure: LAPAROSCOPIC CHOLECYSTECTOMY;  Surgeon: Carolan Shiver, MD;  Location: [facility] ORS;  Service: General;  Laterality: N/A;   COLONOSCOPY     2016 and 2017   edg  2008   FUNCTIONAL ENDOSCOPIC SINUS SURGERY     LUMBAR LAMINECTOMY/ DECOMPRESSION WITH MET-RX Left 03/13/2022   Procedure: LUMBAR LAMINECTOMY/ DECOMPRESSION WITH MET-RX;  Surgeon: Venetia Night, MD;  Location: [facility] ORS;  Service: Neurosurgery;  Laterality: Left;  lumbar laminectomy L2-3 for resection of epidural lesion   TONSILLECTOMY     Patient Active Problem List   Diagnosis Date Noted   Cauda equina syndrome (HCC) 03/13/2022   Epidural hematoma (HCC) 03/13/2022   Lumbar stenosis 03/13/2022   Lumbar epidural mass (HCC) 03/13/2022   Tubular adenoma 10/29/2015   Ulcerative rectosigmoiditis with rectal bleeding (HCC) 03/20/2014   Essential hypertension 02/01/2014   GERD without esophagitis 02/01/2014    PCP: Dr. Bethann Punches   REFERRING  PROVIDER: Dr. Venetia Night  REFERRING DIAG:  M54.16 (ICD-10-CM) - Lumbar radiculopathy  M54.12 (ICD-10-CM) - Cervical radiculopathy  M48.02 (ICD-10-CM) - Cervical stenosis of spinal canal  M48.062 (ICD-10-CM) - Spinal stenosis of lumbar region with neurogenic claudication    THERAPY DIAG:  Cervicalgia  Chronic bilateral low back pain with right-sided sciatica  Chronic bilateral low back pain with left-sided sciatica  Rationale for Evaluation and Treatment: Rehabilitation  ONSET DATE: > 6 months ago  SUBJECTIVE:  SUBJECTIVE STATEMENT: Pt reports continued discomfort in his neck and worsening tingling in his hands. Reports his MD will need a written notice of ineffectiveness of PT in order to proceed with surgical intervention.      From initial Evaluation:Patient reports having cervical and lumbar pain that is progressive. Reports went to neurosurgeon and prescribed PT. States multiple spinal surgeries - most recently on 03/13/2022- L2-3 lumbar laminectomy for resection of spinal mass. He reports pain in neck that can radiate into hands and pain in low back that can radiate down each LE. He has reduced his hours at work from 40 down to approx 18 hours per week.     Hand dominance: Right  PERTINENT HISTORY:  Past spinal surgery- L2-3 lumbar laminectomy for resection of spinal mass. Previous lumbar surgery in 1991. See attached for PMH  PAIN:  Are you having pain? Yes: NPRS scale: 6/10 current LBP; 3/10 at best; 9/10 at worst;  Cervical- 6/10 current; 10/10 at worst; best- 4/10 Pain location: Cervical and Lumbar with sciatica down to knees and sometimes down to feet.  Pain description: Achy with sharp pain.  Aggravating factors: prolonged standing/leaning/  Relieving factors:  rest  PRECAUTIONS: None  RED FLAGS: Spinal tumors: Yes: Removed in 02/2022      WEIGHT BEARING RESTRICTIONS: No  FALLS:  Has patient fallen in last 6 months? No  LIVING ENVIRONMENT: Lives with: lives with their family Lives in: House/apartment Stairs: Yes: External: 8 in front  and 3 in back steps; none Has following equipment at home: None  OCCUPATION: Production designer, theatre/television/film at [employer]  PLOF: Independent  PATIENT GOALS: I want to feel better by 50-75%   NEXT MD VISIT: 02/12/2023- Dr. Myer Haff  OBJECTIVE:  Note: Objective measures were completed at Evaluation unless otherwise noted.  DIAGNOSTIC FINDINGS:  MRI CL spine 11/01/2022 IMPRESSION: 1. Successful decompression at L2-3 with no recurrent epidural lesion or thecal sac stenosis. Chronic left foraminal protrusion at this level with patent foramen. 2. L3-4 moderate narrowing of the thecal sac due to degeneration, epidural fat, and short pedicles. 3. L5-S1 chronic moderate left foraminal stenosis from degeneration.    MRI from [facility] dated 11/01/2022 both images and report were reviewed today.  EXAM:  MRI CERVICAL SPINE WITHOUT CONTRAST    Electronically Signed   By: Tiburcio Pea M.D.   On: 11/26/2022 18:11   IMPRESSION: 1. At least moderate severity bilateral degenerative foraminal impingement at C3-4 to C6-7, sparing the left foramen at C5-6. 2. Diffusely patent spinal canal.     Electronically Signed   By: Tiburcio Pea M.D.   On: 11/26/2022 18:05  PATIENT SURVEYS:  FOTO 44 with goal of 46  COGNITION: Overall cognitive status: Within functional limits for tasks assessed  SENSATION: WFL  POSTURE: rounded shoulders and forward head  PALPATION: (+) tenderness along occiput and B upper trap    CERVICAL ROM:   Active ROM A/PROM (deg) eval  Flexion Chin to chest * painful  Extension 62  Right lateral flexion 22  Left lateral flexion 24  Right rotation 50  Left rotation 48   (Blank rows =  not tested)   UPPER EXTREMITY MMT:  MMT Right eval Left eval  Shoulder flexion 5 5  Shoulder extension    Shoulder abduction 5 5  Shoulder adduction    Shoulder extension 5 5  Shoulder internal rotation    Shoulder external rotation 5 5  Middle trapezius    Lower trapezius    Elbow flexion 5  5  Elbow extension 5 5  Wrist flexion 5 5  Wrist extension 5 5  Wrist ulnar deviation    Wrist radial deviation    Wrist pronation    Wrist supination    Grip strength 51 54   (Blank rows = not tested)  CERVICAL SPECIAL TESTS:  Upper limb tension test (ULTT): will assess next visit and Spurling's test: will assess next visit  FUNCTIONAL TESTS:  5 times sit to stand: will assess next visit 10 meter walk test: Will assess next visit  TREATMENT DATE:                                                                                                         Pt transitions to supine - suboccipital release: reproduces pain in spine, discontinued secondary to pain.   Attempted light UT stretch then tried using GH depression for UT stretch but this also worsened pain bilaterally  - Tried heat to L UT region in supine but too uncomfortable so transitioned to sitting for heat to work on releasing UT  Moist Heat- 5 min :  Applied in supine, pt symptoms worsen, pt reports due to positioning. Transitioned to seated then completed another attempt. Pt reports no change with application of heat for 5 min so this was discontinued.   TE: Seated shrug with focussed relaxation of UT musculaure each rep x 10  Seated scap retraction with focussed relaxation of shoulder musculature each rep   Nustep level 2 x 5 min B UE and LE reciprocal movement, pain improvement in shoulder but increased numbness and tingling in the hands with this activity.    Manual therapy seated: STM and TrP release provided over L UT and R levator scapulae Ischemic TP release with motion x 45 sec, pt reports improvement  following.    PATIENT EDUCATION:  Education details: PT plan of care Person educated: Patient Education method: Explanation Education comprehension: verbalized understanding and returned demonstration  HOME EXERCISE PROGRAM:  Access Code: [email or web address] URL: [email or web address] Date: 02/11/2023 Prepared by: Maureen Ralphs  Exercises - Seated Assisted Cervical Rotation with Towel  - 1 x daily - 3 sets - 10-30 sec hold - Supine Chin Tuck  - 1 x daily - 3 sets - 10 reps - 3-5 sec hold - Seated Cervical Sidebending Stretch  - 1 x daily - 3 sets - 20-30 hold  ASSESSMENT:  CLINICAL IMPRESSION:  Majority of interventions limited by pain and/or tingling in bilat UE.  Throughout therapy thus far patient and multiple therapist have tried many avenues of treatment all of which have been ineffective or have caused worsening of his signs and symptoms most concerning is his progressive B UE numbness and worsening of symptoms ( dropping items, etc). Informed pt this PT would need to speak with other treating therapists in order to make informed decision moving forward.   Physical therapist has discussed with colleagues that patient is more appropriate for another form of intervention and we are recommending him to go back to his doctor at this time prior to coming  back to any more physical therapy. Will contact pt and inform of this via telephone.   OBJECTIVE IMPAIRMENTS: decreased activity tolerance, decreased endurance, decreased mobility, difficulty walking, decreased ROM, decreased strength, hypomobility, impaired flexibility, postural dysfunction, and pain.   ACTIVITY LIMITATIONS: carrying, lifting, bending, sitting, standing, squatting, sleeping, bed mobility, and reach over head  PARTICIPATION LIMITATIONS: cleaning, laundry, shopping, community activity, and yard work  PERSONAL FACTORS: Time since onset of injury/illness/exacerbation and 1-2 comorbidities: multiple surgeries   are also affecting patient's functional outcome.   REHAB POTENTIAL: Good  CLINICAL DECISION MAKING: Evolving/moderate complexity  EVALUATION COMPLEXITY: Moderate   GOALS: Goals reviewed with patient? Yes  SHORT TERM GOALS: Target date: 03/18/2023  Pt will be independent with HEP in order to improve strength and decrease back pain in order to improve pain-free function at home and work. Baseline: EVAL- No formal HEP in place Goal status: INITIAL   LONG TERM GOALS: Target date: 04/28/2023  Pt will improve FOTO to target score of 46 to display perceived improvements in ability to complete ADL's.  Baseline: EVAL = 44 Goal status: INITIAL  2.  Pt will decrease worst back pain as reported on NPRS by at least 2 points in order to demonstrate clinically significant reduction in back pain.  Baseline: EVAL: 9-10 cervical and Lumbar pain at worst Goal status: INITIAL  3.  Patient will present with 10 deg improvement in cervical Rotation/sidebending for improved head mobility Baseline: EVAL: Right Rot= 50; left=48; right SB=22 and Left SB=24 deg Goal status: INITIAL  4.  Patient will report improved ability to stand and perform ADL > 1 hour without resting for improved ability to work.  Baseline: EVAL- Patient reports diffiuclty with prolonged standing and performing work functions Goal status: INITIAL   5. Pt will decrease mODI score by at least 13 points in order demonstrate clinically significant reduction in back pain/disability.     Baseline: 02/10/2023= 66%  Goal status: New   PLAN:  PT FREQUENCY: 1-2x/week  PT DURATION: 12 weeks  PLANNED INTERVENTIONS: 97164- PT Re-evaluation, 97110-Therapeutic exercises, 97530- Therapeutic activity, 97112- Neuromuscular re-education, 97535- Self Care, [address]- Manual therapy, [phone]- Gait training, [phone]- Electrical stimulation (manual), Patient/Family education, Balance training, Stair training, Taping, Dry Needling, Joint mobilization, Joint  manipulation, Spinal manipulation, Spinal mobilization, Scar mobilization, Vestibular training, DME instructions, Cryotherapy, and Moist heat  PLAN FOR NEXT SESSION: DN as appopriate, Manual therapy for pain relief and hypomobility, ROM activities and add to HEP as appropriate.    Norman Herrlich, PT 03/02/2023, 4:22 PM

## 2023-03-03 ENCOUNTER — Telehealth: Payer: Self-pay

## 2023-03-03 NOTE — Telephone Encounter (Signed)
-----   Message from [facility] sent at 03/03/2023 11:04 AM EST ----- Looks like we might need to move up his appointment ----- Message ----- From: Norman Herrlich, PT Sent: 03/02/2023   5:31 PM EST To: Venetia Night, MD  Dr. Myer Haff,  Patient informed me that your staff wanted some form of communication that physical therapy has not been effective.  Please see clinical impression statement as well as other physical therapy notes.  Everything we have tried in physical therapy has been minimally effective if not exacerbating of his symptoms, contrary to usual results.  Patient's numbness and tingling in his hands as well as his frequency of dropping items his seems to be increasing as well which is a concern for [place]. Thank you for your time.   Best,   Thresa Ross PT, DPT

## 2023-03-04 ENCOUNTER — Ambulatory Visit: Payer: Medicaid Other | Admitting: Physical Therapy

## 2023-03-04 ENCOUNTER — Encounter: Payer: Medicaid Other | Admitting: Physical Therapy

## 2023-03-04 NOTE — Telephone Encounter (Signed)
 Left message to call the office.

## 2023-03-05 NOTE — Telephone Encounter (Signed)
 Left message to call back

## 2023-03-06 NOTE — Telephone Encounter (Signed)
03/17/2023. 

## 2023-03-09 ENCOUNTER — Encounter: Payer: Medicaid Other | Admitting: Physical Therapy

## 2023-03-11 ENCOUNTER — Encounter: Payer: Medicaid Other | Admitting: Physical Therapy

## 2023-03-16 ENCOUNTER — Encounter: Payer: Medicaid Other | Admitting: Physical Therapy

## 2023-03-17 ENCOUNTER — Ambulatory Visit (INDEPENDENT_AMBULATORY_CARE_PROVIDER_SITE_OTHER): Payer: Medicaid Other | Admitting: Neurosurgery

## 2023-03-17 ENCOUNTER — Other Ambulatory Visit: Payer: Self-pay

## 2023-03-17 ENCOUNTER — Encounter: Payer: Self-pay | Admitting: Neurosurgery

## 2023-03-17 VITALS — BP 128/80 | Ht 72.0 in | Wt 269.0 lb

## 2023-03-17 DIAGNOSIS — M5416 Radiculopathy, lumbar region: Secondary | ICD-10-CM | POA: Diagnosis not present

## 2023-03-17 DIAGNOSIS — M4802 Spinal stenosis, cervical region: Secondary | ICD-10-CM

## 2023-03-17 DIAGNOSIS — M5412 Radiculopathy, cervical region: Secondary | ICD-10-CM | POA: Diagnosis not present

## 2023-03-17 DIAGNOSIS — M48061 Spinal stenosis, lumbar region without neurogenic claudication: Secondary | ICD-10-CM | POA: Diagnosis not present

## 2023-03-17 DIAGNOSIS — Z01818 Encounter for other preprocedural examination: Secondary | ICD-10-CM

## 2023-03-17 NOTE — Addendum Note (Signed)
 Addended by: Sharlot Gowda on: 03/17/2023 02:28 PM   Modules accepted: Orders

## 2023-03-17 NOTE — Progress Notes (Signed)
 REFERRING PHYSICIAN:  Danella Penton, Md [address] [facility] [place],  [place] [address]  DOS: 03/13/22  L2-3 lumbar laminectomy for resection of spinal mass, including use of microscope   HISTORY OF PRESENT ILLNESS: 03/17/2023  L C5/6 ESI 12/24/2022  Rodney Cisneros continues to have significant pain, numbness, and tingling in his arms. He tried physical therapy without improvement.  The injection did not help. 12/09/2022 Rodney Cisneros continues to have symptoms.  He has had significant flares with his psoriasis.  He continues to have numbness in his left hand.  He is having back and leg discomfort particularly in his left foot extending down to the top of his foot.  10/07/2022 Rodney Cisneros is status post L2-3 lumbar decompression and resection of mass (likely disc herniation).  Over the past week, he has had severe pain in his left lower back and down the back of his left leg.  He denies pain on his anterior thigh.  He denies numbness.  Coughing or sneezing makes it worse.  He is having some difficulty with work due to this level of pain.  He previously was doing quite well.  He also has had some pain in his left shoulder blade.  He reports some problems with his balance.    PHYSICAL EXAMINATION:   Awake, alert, oriented to person, place, and time.  Speech is clear and fluent. Fund of knowledge is appropriate.   Cranial Nerves: Pupils equal round and reactive to light.  Facial tone is symmetric.    No posterior cervical tenderness. No tenderness in bilateral trapezial region.   He has thoracic tenderness between the shoulder blades and in the scapular region bilaterally.   No posterior lumbar tenderness.  Lumbar incision is well healed.   No abnormal lesions on exposed skin.   Strength: Side Biceps Triceps Deltoid Interossei Grip Wrist Ext. Wrist Flex.  R 5 5 5 5  4+ 5 5  L 5 5 5 5  4+ 5 5   Side Iliopsoas Quads Hamstring PF DF EHL  R 5 5 5  5 5 5   L 4+ 5 5 5 5 5    Reflexes are 2+ and symmetric at the biceps, triceps, brachioradialis, patella and achilles.   Hoffman's is present on the right.  Clonus is not present.   Bilateral upper and lower extremity sensation is intact to light touch.       ROS (Neurologic):  Negative except as noted above  IMAGING: MRI CL spine 11/01/2022 IMPRESSION: 1. Successful decompression at L2-3 with no recurrent epidural lesion or thecal sac stenosis. Chronic left foraminal protrusion at this level with patent foramen. 2. L3-4 moderate narrowing of the thecal sac due to degeneration, epidural fat, and short pedicles. 3. L5-S1 chronic moderate left foraminal stenosis from degeneration.     Electronically Signed   By: Tiburcio Pea M.D.   On: 11/26/2022 18:11  IMPRESSION: 1. At least moderate severity bilateral degenerative foraminal impingement at C3-4 to C6-7, sparing the left foramen at C5-6. 2. Diffusely patent spinal canal.     Electronically Signed   By: Tiburcio Pea M.D.   On: 11/26/2022 18:05   ASSESSMENT/PLAN:  Rodney Cisneros has had a setback after his surgery.  He has some symptoms of left L5 radiculopathy.  He has stenosis at L3-4.  He could have some neurogenic claudication.  In his neck, he has multilevel foraminal stenosis from C3-C7.  This is at least moderate in nature at every  level.  He has tried and failed conservative management.  At this point, no further conservative management is indicated.  He continues to suffer from cervical radiculopathy due to cervical stenosis.  I have recommended C3-7 anterior cervical discectomy and fusion.  I discussed the planned procedure at length with the patient, including the risks, benefits, alternatives, and indications. The risks discussed include but are not limited to bleeding, infection, need for reoperation, spinal fluid leak, stroke, vision loss, anesthetic complication, coma, paralysis, and even death. We also  discussed the possibility of post-operative dysphagia, vocal cord paralysis, and the risk of adjacent segment disease in the future. I also described in detail that improvement was not guaranteed.  The patient expressed understanding of these risks, and asked that we proceed with surgery. I described the surgery in layman's terms, and gave ample opportunity for questions, which were answered to the best of my ability.     I spent a total of 20 minutes in this patient's care today. This time was spent reviewing pertinent records including imaging studies, obtaining and confirming history, performing a directed evaluation, formulating and discussing my recommendations, and documenting the visit within the medical record.     Venetia Night Department of neurosurgery

## 2023-03-17 NOTE — Patient Instructions (Signed)
 Please see below for information in regards to your upcoming surgery:   Planned surgery: C3-7 anterior cervical discectomy and fusion   Surgery date: 04/22/23 at [facility] (Medical Mall: [address] [place], [place] [address]) - you will find out your arrival time the business day before your surgery.    Pre-op appointment at [facility] Pre-admit Testing: we will call you with a date/time for this. If you are scheduled for an in person appointment, Pre-admit Testing is located on the first floor of the [facility] building, [address], [address]. Please bring all prescriptions in the original prescription bottles to your appointment. During this appointment, they will advise you which medications you can take the morning of surgery, and which medications you will need to hold for surgery. Labs (such as blood work, EKG) may be done at your pre-op appointment. You are not required to fast for these labs. Should you need to change your pre-op appointment, please call Pre-admit testing at [phone].      Brace: You will need to bring the brace to the hospital on the day of surgery. [facility] will contact you regarding an appointment for the brace you will use after surgery. If it is getting close to your surgery date and you have not received an appointment with [facility], please reach out to [place].  Their number is [phone] should you miss their call or have an issue with your brace after surgery.     NSAIDS (Non-steroidal anti-inflammatory drugs): because you are having a fusion, please avoid taking any NSAIDS (examples: ibuprofen, motrin, aleve, naproxen, meloxicam, diclofenac) for 3 months after surgery. Celebrex is an exception and is OK to take, if prescribed. Tylenol is not an NSAID.    Common restrictions after surgery: No bending, lifting, or twisting ("BLT"). Avoid lifting objects heavier than 10 pounds for the first 6 weeks after  surgery. Where possible, avoid household activities that involve lifting, bending, reaching, pushing, or pulling such as laundry, vacuuming, grocery shopping, and childcare. Try to arrange for help from friends and family for these activities while you heal. Do not drive while taking prescription pain medication. Weeks 6 through 12 after surgery: avoid lifting more than 25 pounds.    X-rays after surgery: Because you are having a fusion or arthroplasty: for appointments after your 2 week follow-up: please arrive at the [facility] outpatient imaging center ([address], [address], [place]) or [facility] one hour prior to your appointment for x-rays. This applies to every appointment after your 2 week follow-up. Failure to do so may result in your appointment being rescheduled.   How to contact us:  If you have any questions/concerns before or after surgery, you can reach [place] at [phone], or you can send a mychart message. We can be reached by phone or mychart 8am-4pm, Monday-Friday.  *Please note: Calls after 4pm are forwarded to a third party answering service. Mychart messages are not routinely monitored during evenings, weekends, and holidays. Please call our office to contact the answering service for urgent concerns during non-business hours.   If you have FMLA/disability paperwork, please drop it off or fax it to [phone], attention Patty.   Appointments/FMLA & disability paperwork: Joycelyn Rua, & Flonnie Hailstone Registered Nurse/Surgery scheduler: Royston Cowper Medical Assistants: Nash Mantis Physician Assistants: Joan Flores, PA-C, Manning Charity, PA-C & Drake Leach, PA-C Surgeons: Venetia Night, MD & Ernestine Mcmurray, MD

## 2023-03-18 ENCOUNTER — Encounter: Payer: Medicaid Other | Admitting: Physical Therapy

## 2023-03-23 ENCOUNTER — Encounter: Payer: Medicaid Other | Admitting: Physical Therapy

## 2023-03-25 ENCOUNTER — Encounter: Payer: Medicaid Other | Admitting: Physical Therapy

## 2023-03-25 ENCOUNTER — Telehealth: Payer: Self-pay | Admitting: Neurosurgery

## 2023-03-25 NOTE — Telephone Encounter (Signed)
 Patient is calling to request something for his pain until he has surgery. He states that he is having pain and spasms in his legs.   [facility] [address]

## 2023-03-26 ENCOUNTER — Other Ambulatory Visit: Payer: Self-pay | Admitting: Neurosurgery

## 2023-03-26 MED ORDER — OXYCODONE HCL 5 MG PO TABS: 5.0000 mg | ORAL_TABLET | Freq: Two times a day (BID) | ORAL | 0 refills | Status: DC | PRN

## 2023-03-26 NOTE — Progress Notes (Signed)
 Sent in Oxycodone BID PRN

## 2023-03-26 NOTE — Telephone Encounter (Signed)
 Medication has been sent in, please let him know.

## 2023-03-30 ENCOUNTER — Encounter: Payer: Medicaid Other | Admitting: Physical Therapy

## 2023-04-01 ENCOUNTER — Encounter: Payer: Medicaid Other | Admitting: Physical Therapy

## 2023-04-02 ENCOUNTER — Telehealth: Payer: Self-pay | Admitting: Neurosurgery

## 2023-04-02 NOTE — Telephone Encounter (Signed)
 C3-7 ACDF on 04/22/23  Patient is requesting refill for oxycodone. He states that the rx from 03/26/2023, he was only given 14 pills enough for 7 days. [facility] has a policy that they will not dispense more than 7 days the first time. So he has 1 pill left. [facility] [address]

## 2023-04-03 ENCOUNTER — Other Ambulatory Visit: Payer: Self-pay | Admitting: Neurosurgery

## 2023-04-03 ENCOUNTER — Telehealth: Payer: Self-pay | Admitting: Neurosurgery

## 2023-04-03 ENCOUNTER — Telehealth: Payer: Self-pay

## 2023-04-03 MED ORDER — METHYLPREDNISOLONE 4 MG PO TBPK: ORAL_TABLET | ORAL | 0 refills | Status: DC

## 2023-04-03 MED ORDER — OXYCODONE HCL 5 MG PO TABS: 5.0000 mg | ORAL_TABLET | Freq: Two times a day (BID) | ORAL | 0 refills | Status: DC | PRN

## 2023-04-03 NOTE — Telephone Encounter (Signed)
 Request was denied. I contacted Covermymeds to discuss this and they suggested resubmitting and re-answering clinical questions. I have taken care of this. It is pending clinical review. [place] is [id number]

## 2023-04-03 NOTE — Telephone Encounter (Signed)
 Patient notified via voicemail.

## 2023-04-03 NOTE — Telephone Encounter (Signed)
 C3-7 ACDF on 04/22/23  Patient calling that for the past 3 days he has had pain similar to a cramp in his right hip. He can barely walk or stand. The oxycodone does not touch this pain. Is there something that Dr.Yarbrough can prescribe or suggest he do?

## 2023-04-03 NOTE — Progress Notes (Signed)
 PDMP reviewed and appropriate. Sent refill for Oxycodone BID to get to his 4/9 surgery date

## 2023-04-03 NOTE — Telephone Encounter (Signed)
 I spoke with Rodney Cisneros. He reports the pain is in the side of his right of his hip and shooting down the back of his right leg to slightly past the back of his knee to the top of his calf. It feels like a cramp that doesn't go away. He has to hold on to something when he stands back up. This started about 4-5 days ago. He is taking oxycodone without relief.  He has tried tylenol and advil without relief.  He has also tried a heating pad. He has not had any lumbar ESI's. He is scheduled for his ACDF with Dr Myer Haff on 04/22/23. He is willing to try whatever Dr Myer Haff recommends.  [facility] [address]

## 2023-04-03 NOTE — Telephone Encounter (Addendum)
 Discussed with Dr Myer Haff. He recommends a medrol dosepack. I spoke with Mr Rodney Cisneros and he was agreeable to this. Rx has been sent. He confirms he has taken steroids before and is aware of the common side effects.

## 2023-04-03 NOTE — Telephone Encounter (Signed)
 Cover my meds authorization submitted for Oxycodone 5mg  refill sent with appropriate clinicals.

## 2023-04-06 ENCOUNTER — Encounter: Payer: Medicaid Other | Admitting: Physical Therapy

## 2023-04-06 NOTE — Telephone Encounter (Signed)
 Left secure message on secure voice mail.

## 2023-04-06 NOTE — Telephone Encounter (Signed)
 Please let the patient know that his Oxycodone has been approved   This is valid from 04/04/23 to 10/05/23.   I have faxed this to the pharmacy as well.

## 2023-04-08 ENCOUNTER — Encounter: Payer: Medicaid Other | Admitting: Physical Therapy

## 2023-04-09 ENCOUNTER — Other Ambulatory Visit: Payer: Self-pay

## 2023-04-09 ENCOUNTER — Encounter
Admission: RE | Admit: 2023-04-09 | Discharge: 2023-04-09 | Disposition: A | Discharge status: Home / Self Care | Source: Ambulatory Visit | Attending: Neurosurgery | Admitting: Neurosurgery

## 2023-04-09 VITALS — BP 157/85 | HR 90 | Resp 16 | Ht 72.0 in | Wt 272.9 lb

## 2023-04-09 DIAGNOSIS — Z01818 Encounter for other preprocedural examination: Secondary | ICD-10-CM | POA: Insufficient documentation

## 2023-04-09 DIAGNOSIS — I10 Essential (primary) hypertension: Secondary | ICD-10-CM | POA: Insufficient documentation

## 2023-04-09 DIAGNOSIS — Z01812 Encounter for preprocedural laboratory examination: Secondary | ICD-10-CM

## 2023-04-09 HISTORY — DX: Gout, unspecified: M10.9

## 2023-04-09 LAB — BASIC METABOLIC PANEL WITH GFR
Anion gap: 9 (ref 5–15)
BUN: 17 mg/dL (ref 6–20)
CO2: 26 mmol/L (ref 22–32)
Calcium: 9.1 mg/dL (ref 8.9–10.3)
Chloride: 102 mmol/L (ref 98–111)
Creatinine, Ser: 0.83 mg/dL (ref 0.61–1.24)
GFR, Estimated: >60 mL/min (ref >60)
Glucose, Bld: 107 mg/dL — ABNORMAL HIGH (ref 70–99)
Potassium: 4.1 mmol/L (ref 3.5–5.1)
Sodium: 137 mmol/L (ref 135–145)

## 2023-04-09 LAB — TYPE AND SCREEN
ABO/RH(D): A POS
Antibody Screen: NEGATIVE

## 2023-04-09 LAB — CBC
HCT: 42 % (ref 39.0–52.0)
Hemoglobin: 14.5 g/dL (ref 13.0–17.0)
MCH: 31.9 pg (ref 26.0–34.0)
MCHC: 34.5 g/dL (ref 30.0–36.0)
MCV: 92.3 fL (ref 80.0–100.0)
Platelets: 298 10*3/uL (ref 150–400)
RBC: 4.55 MIL/uL (ref 4.22–5.81)
RDW: 13.2 % (ref 11.5–15.5)
WBC: 8 10*3/uL (ref 4.0–10.5)
nRBC: 0 % (ref 0.0–0.2)

## 2023-04-09 LAB — SURGICAL PCR SCREEN
MRSA, PCR: NEGATIVE
Staphylococcus aureus: POSITIVE — AB

## 2023-04-09 NOTE — Patient Instructions (Addendum)
 Your procedure is scheduled on:04/22/23 - Wednesday Report to the Registration Desk on the 1st floor of the [facility]. To find out your arrival time, please call [phone] between 1PM - 3PM on: 04/21/23 - Tuesday If your arrival time is 6:00 am, do not arrive before that time as the [facility] entrance doors do not open until 6:00 am.  REMEMBER: Instructions that are not followed completely may result in serious medical risk, up to and including death; or upon the discretion of your surgeon and anesthesiologist your surgery may need to be rescheduled.  Do not eat food after midnight the night before surgery.  No gum chewing or hard candies.  You may however, drink CLEAR liquids up to 2 hours before you are scheduled to arrive for your surgery. Do not drink anything within 2 hours of your scheduled arrival time.  Clear liquids include: - water  - apple juice without pulp - gatorade (not RED colors) - black coffee or tea (Do NOT add milk or creamers to the coffee or tea) Do NOT drink anything that is not on this list.  NSAIDS (Non-steroidal anti-inflammatory drugs): because you are having a fusion, please avoid taking any NSAIDS (examples: ibuprofen, motrin, aleve, naproxen, meloxicam, diclofenac) for 3 months after surgery. Celebrex is an exception and is OK to take, if prescribed. Tylenol is not an NSAID.    Stop ANY OVER THE COUNTER supplements until after surgery : Multiple Vitamin,VITAMIN D .  ON THE DAY OF SURGERY ONLY TAKE THESE MEDICATIONS WITH SIPS OF WATER:  DULoxetine (CYMBALTA)  lamoTRIgine (LAMICTAL)  metoprolol (LOPRESSOR)  omeprazole (PRILOSEC)  oxyCODONE (ROXICODONE) if needed.   No Alcohol for 24 hours before or after surgery.  No Smoking including e-cigarettes for 24 hours before surgery.  No chewable tobacco products for at least 6 hours before surgery.  No nicotine patches on the day of surgery.  Do not use any "recreational" drugs for at least a  week (preferably 2 weeks) before your surgery.  Please be advised that the combination of cocaine and anesthesia may have negative outcomes, up to and including death. If you test positive for cocaine, your surgery will be cancelled.  On the morning of surgery brush your teeth with toothpaste and water, you may rinse your mouth with mouthwash if you wish. Do not swallow any toothpaste or mouthwash.  Use CHG Soap or wipes as directed on instruction sheet.  Do not wear jewelry, make-up, hairpins, clips or nail polish.  For welded (permanent) jewelry: bracelets, anklets, waist bands, etc.  Please have this removed prior to surgery.  If it is not removed, there is a chance that hospital personnel will need to cut it off on the day of surgery.  Do not wear lotions, powders, or perfumes.   Do not shave body hair from the neck down 48 hours before surgery.  Contact lenses, hearing aids and dentures may not be worn into surgery.  Do not bring valuables to the hospital. [facility] is not responsible for any missing/lost belongings or valuables.   Notify your doctor if there is any change in your medical condition (cold, fever, infection).  Wear comfortable clothing (specific to your surgery type) to the hospital.  After surgery, you can help prevent lung complications by doing breathing exercises.  Take deep breaths and cough every 1-2 hours. Your doctor may order a device called an Incentive Spirometer to help you take deep breaths.  When coughing or sneezing, hold a pillow firmly  against your incision with both hands. This is called "splinting." Doing this helps protect your incision. It also decreases belly discomfort.  If you are being admitted to the hospital overnight, leave your suitcase in the car. After surgery it may be brought to your room.  In case of increased patient census, it may be necessary for you, the patient, to continue your postoperative care in the Same Day Surgery  department.  If you are being discharged the day of surgery, you will not be allowed to drive home. You will need a responsible individual to drive you home and stay with you for 24 hours after surgery.   If you are taking public transportation, you will need to have a responsible individual with you.  Please call the Pre-admissions Testing Dept. at [phone] if you have any questions about these instructions.  Surgery Visitation Policy:  Patients having surgery or a procedure may have two visitors.  Children under the age of 90 must have an adult with them who is not the patient.  Temporary Visitor Restrictions Due to increasing cases of flu, RSV and COVID-19: Children ages 61 and under will not be able to visit patients in [facility] hospitals under most circumstances.  Inpatient Visitation:    Visiting hours are 7 a.m. to 8 p.m. Up to four visitors are allowed at one time in a patient room. The visitors may rotate out with other people during the day.  One visitor age 71 or older may stay with the patient overnight and must be in the room by 8 p.m.    Pre-operative 5 CHG Bath Instructions   You can play a key role in reducing the risk of infection after surgery. Your skin needs to be as free of germs as possible. You can reduce the number of germs on your skin by washing with CHG (chlorhexidine gluconate) soap before surgery. CHG is an antiseptic soap that kills germs and continues to kill germs even after washing.   DO NOT use if you have an allergy to chlorhexidine/CHG or antibacterial soaps. If your skin becomes reddened or irritated, stop using the CHG and notify one of our RNs at [phone].   Please shower with the CHG soap starting 4 days before surgery using the following schedule: 04/05 - 04/09.    Please keep in mind the following:  DO NOT shave, including legs and underarms, starting the day of your first shower.   You may shave your face at any point  before/day of surgery.  Place clean sheets on your bed the day you start using CHG soap. Use a clean washcloth (not used since being washed) for each shower. DO NOT sleep with pets once you start using the CHG.   CHG Shower Instructions:  If you choose to wash your hair and private area, wash first with your normal shampoo/soap.  After you use shampoo/soap, rinse your hair and body thoroughly to remove shampoo/soap residue.  Turn the water OFF and apply about 3 tablespoons (45 ml) of CHG soap to a CLEAN washcloth.  Apply CHG soap ONLY FROM YOUR NECK DOWN TO YOUR TOES (washing for 3-5 minutes)  DO NOT use CHG soap on face, private areas, open wounds, or sores.  Pay special attention to the area where your surgery is being performed.  If you are having back surgery, having someone wash your back for you may be helpful. Wait 2 minutes after CHG soap is applied, then you may rinse off the  CHG soap.  Pat dry with a clean towel  Put on clean clothes/pajamas   If you choose to wear lotion, please use ONLY the CHG-compatible lotions on the back of this paper.     Additional instructions for the day of surgery: DO NOT APPLY any lotions, deodorants, cologne, or perfumes.   Put on clean/comfortable clothes.  Brush your teeth.  Ask your nurse before applying any prescription medications to the skin.      CHG Compatible Lotions   Aveeno Moisturizing lotion  Cetaphil Moisturizing Cream  Cetaphil Moisturizing Lotion  Clairol Herbal Essence Moisturizing Lotion, Dry Skin  Clairol Herbal Essence Moisturizing Lotion, Extra Dry Skin  Clairol Herbal Essence Moisturizing Lotion, Normal Skin  Curel Age Defying Therapeutic Moisturizing Lotion with Alpha Hydroxy  Curel Extreme Care Body Lotion  Curel Soothing Hands Moisturizing Hand Lotion  Curel Therapeutic Moisturizing Cream, Fragrance-Free  Curel Therapeutic Moisturizing Lotion, Fragrance-Free  Curel Therapeutic Moisturizing Lotion, Original  Formula  Eucerin Daily Replenishing Lotion  Eucerin Dry Skin Therapy Plus Alpha Hydroxy Crme  Eucerin Dry Skin Therapy Plus Alpha Hydroxy Lotion  Eucerin Original Crme  Eucerin Original Lotion  Eucerin Plus Crme Eucerin Plus Lotion  Eucerin TriLipid Replenishing Lotion  Keri Anti-Bacterial Hand Lotion  Keri Deep Conditioning Original Lotion Dry Skin Formula Softly Scented  Keri Deep Conditioning Original Lotion, Fragrance Free Sensitive Skin Formula  Keri Lotion Fast Absorbing Fragrance Free Sensitive Skin Formula  Keri Lotion Fast Absorbing Softly Scented Dry Skin Formula  Keri Original Lotion  Keri Skin Renewal Lotion Keri Silky Smooth Lotion  Keri Silky Smooth Sensitive Skin Lotion  Nivea Body Creamy Conditioning Oil  Nivea Body Extra Enriched Teacher, adult education Moisturizing Lotion Nivea Crme  Nivea Skin Firming Lotion  NutraDerm 30 Skin Lotion  NutraDerm Skin Lotion  NutraDerm Therapeutic Skin Cream  NutraDerm Therapeutic Skin Lotion  ProShield Protective Hand Cream  Provon moisturizing lotion

## 2023-04-13 ENCOUNTER — Encounter: Payer: Medicaid Other | Admitting: Physical Therapy

## 2023-04-15 ENCOUNTER — Encounter: Payer: Medicaid Other | Admitting: Physical Therapy

## 2023-04-20 ENCOUNTER — Encounter: Payer: Medicaid Other | Admitting: Physical Therapy

## 2023-04-22 ENCOUNTER — Inpatient Hospital Stay
Admission: RE | Admit: 2023-04-22 | Discharge: 2023-04-23 | DRG: 473 | Disposition: A | Discharge status: Home / Self Care | Source: Ambulatory Visit | Attending: Neurosurgery | Admitting: Neurosurgery

## 2023-04-22 ENCOUNTER — Encounter: Payer: Self-pay | Admitting: Neurosurgery

## 2023-04-22 ENCOUNTER — Inpatient Hospital Stay: Admitting: Anesthesiology

## 2023-04-22 ENCOUNTER — Encounter
Admission: RE | Disposition: A | Discharge status: Home / Self Care | Payer: Self-pay | Source: Ambulatory Visit | Attending: Neurosurgery

## 2023-04-22 ENCOUNTER — Other Ambulatory Visit: Payer: Self-pay

## 2023-04-22 ENCOUNTER — Encounter: Payer: Medicaid Other | Admitting: Physical Therapy

## 2023-04-22 ENCOUNTER — Inpatient Hospital Stay

## 2023-04-22 ENCOUNTER — Inpatient Hospital Stay: Payer: Self-pay | Admitting: Urgent Care

## 2023-04-22 DIAGNOSIS — Z79899 Other long term (current) drug therapy: Secondary | ICD-10-CM

## 2023-04-22 DIAGNOSIS — Z981 Arthrodesis status: Principal | ICD-10-CM

## 2023-04-22 DIAGNOSIS — M5412 Radiculopathy, cervical region: Secondary | ICD-10-CM | POA: Diagnosis present

## 2023-04-22 DIAGNOSIS — Z87891 Personal history of nicotine dependence: Secondary | ICD-10-CM | POA: Diagnosis not present

## 2023-04-22 DIAGNOSIS — L409 Psoriasis, unspecified: Secondary | ICD-10-CM | POA: Diagnosis present

## 2023-04-22 DIAGNOSIS — M4802 Spinal stenosis, cervical region: Secondary | ICD-10-CM | POA: Diagnosis present

## 2023-04-22 DIAGNOSIS — Z882 Allergy status to sulfonamides status: Secondary | ICD-10-CM | POA: Diagnosis not present

## 2023-04-22 DIAGNOSIS — Z01818 Encounter for other preprocedural examination: Secondary | ICD-10-CM

## 2023-04-22 HISTORY — PX: ANTERIOR CERVICAL DECOMPRESSION/DISCECTOMY FUSION 4 LEVELS: SHX5556

## 2023-04-22 HISTORY — DX: Failed or difficult intubation, initial encounter: T88.4XXA

## 2023-04-22 SURGERY — ANTERIOR CERVICAL DECOMPRESSION/DISCECTOMY FUSION 4 LEVELS
Anesthesia: General

## 2023-04-22 MED ORDER — ONDANSETRON HCL 4 MG PO TABS: 4.0000 mg | ORAL_TABLET | Freq: Four times a day (QID) | ORAL | Status: DC | PRN

## 2023-04-22 MED ORDER — SORBITOL 70 % SOLN: 30.0000 mL | Freq: Every day | Status: DC | PRN

## 2023-04-22 MED ORDER — ACETAMINOPHEN 500 MG PO TABS
1000.0000 mg | ORAL_TABLET | Freq: Four times a day (QID) | ORAL | Status: DC
  Administered 2023-04-22 – 2023-04-23 (×3): 1000 mg via ORAL
  Filled 2023-04-22 (×3): qty 2

## 2023-04-22 MED ORDER — OXYCODONE HCL 5 MG PO TABS
5.0000 mg | ORAL_TABLET | Freq: Once | ORAL | Status: AC | PRN
  Administered 2023-04-22: 5 mg via ORAL

## 2023-04-22 MED ORDER — REMIFENTANIL HCL 1 MG IV SOLR
INTRAVENOUS | Status: AC
  Filled 2023-04-22: qty 1000

## 2023-04-22 MED ORDER — FENTANYL CITRATE (PF) 100 MCG/2ML IJ SOLN
25.0000 ug | INTRAMUSCULAR | Status: DC | PRN
  Administered 2023-04-22: 50 ug via INTRAVENOUS
  Administered 2023-04-22: 25 ug via INTRAVENOUS
  Administered 2023-04-22: 50 ug via INTRAVENOUS
  Administered 2023-04-22: 25 ug via INTRAVENOUS
  Administered 2023-04-22: 50 ug via INTRAVENOUS

## 2023-04-22 MED ORDER — PHENYLEPHRINE HCL-NACL 20-0.9 MG/250ML-% IV SOLN
INTRAVENOUS | Status: DC | PRN
  Administered 2023-04-22: 50 ug/min via INTRAVENOUS

## 2023-04-22 MED ORDER — METOPROLOL TARTRATE 50 MG PO TABS
50.0000 mg | ORAL_TABLET | Freq: Two times a day (BID) | ORAL | Status: DC
  Administered 2023-04-22 – 2023-04-23 (×2): 50 mg via ORAL
  Filled 2023-04-22: qty 1

## 2023-04-22 MED ORDER — LAMOTRIGINE 25 MG PO TABS
100.0000 mg | ORAL_TABLET | Freq: Every day | ORAL | Status: DC
Start: 2023-04-22 — End: 2023-04-23
  Administered 2023-04-22 – 2023-04-23 (×2): 100 mg via ORAL
  Filled 2023-04-22 (×2): qty 4

## 2023-04-22 MED ORDER — PANTOPRAZOLE SODIUM 40 MG PO TBEC
40.0000 mg | DELAYED_RELEASE_TABLET | Freq: Every day | ORAL | Status: DC
  Administered 2023-04-23: 40 mg via ORAL
  Filled 2023-04-22: qty 1

## 2023-04-22 MED ORDER — EPHEDRINE SULFATE-NACL 50-0.9 MG/10ML-% IV SOSY
PREFILLED_SYRINGE | INTRAVENOUS | Status: DC | PRN
  Administered 2023-04-22: 5 mg via INTRAVENOUS
  Administered 2023-04-22 (×2): 10 mg via INTRAVENOUS

## 2023-04-22 MED ORDER — METHOCARBAMOL 500 MG PO TABS
ORAL_TABLET | ORAL | Status: AC
  Filled 2023-04-22: qty 1

## 2023-04-22 MED ORDER — METHOCARBAMOL 1000 MG/10ML IJ SOLN: 500.0000 mg | Freq: Four times a day (QID) | INTRAMUSCULAR | Status: DC | PRN

## 2023-04-22 MED ORDER — KETOROLAC TROMETHAMINE 15 MG/ML IJ SOLN
15.0000 mg | Freq: Four times a day (QID) | INTRAMUSCULAR | Status: AC
  Administered 2023-04-22 – 2023-04-23 (×4): 15 mg via INTRAVENOUS
  Filled 2023-04-22 (×3): qty 1

## 2023-04-22 MED ORDER — ACETAMINOPHEN 10 MG/ML IV SOLN
INTRAVENOUS | Status: DC | PRN
  Administered 2023-04-22: 1000 mg via INTRAVENOUS

## 2023-04-22 MED ORDER — POLYETHYLENE GLYCOL 3350 17 G PO PACK: 17.0000 g | PACK | Freq: Every day | ORAL | Status: DC | PRN

## 2023-04-22 MED ORDER — ACETAMINOPHEN 325 MG PO TABS: 650.0000 mg | ORAL_TABLET | ORAL | Status: DC | PRN

## 2023-04-22 MED ORDER — DEXMEDETOMIDINE HCL IN NACL 80 MCG/20ML IV SOLN
INTRAVENOUS | Status: DC | PRN
  Administered 2023-04-22: 12 ug via INTRAVENOUS

## 2023-04-22 MED ORDER — SUCCINYLCHOLINE CHLORIDE 200 MG/10ML IV SOSY
PREFILLED_SYRINGE | INTRAVENOUS | Status: DC | PRN
  Administered 2023-04-22: 160 mg via INTRAVENOUS

## 2023-04-22 MED ORDER — SODIUM CHLORIDE 0.9 % IV SOLN: 250.0000 mL | INTRAVENOUS | Status: DC

## 2023-04-22 MED ORDER — SENNA 8.6 MG PO TABS
1.0000 | ORAL_TABLET | Freq: Two times a day (BID) | ORAL | Status: DC
  Administered 2023-04-23: 8.6 mg via ORAL
  Filled 2023-04-22: qty 1

## 2023-04-22 MED ORDER — LACTATED RINGERS IV SOLN: INTRAVENOUS | Status: DC | PRN

## 2023-04-22 MED ORDER — MIDAZOLAM HCL 2 MG/2ML IJ SOLN
1.0000 mg | Freq: Once | INTRAMUSCULAR | Status: AC
  Administered 2023-04-22: 1 mg via INTRAVENOUS

## 2023-04-22 MED ORDER — HYDROMORPHONE HCL 1 MG/ML IJ SOLN
INTRAMUSCULAR | Status: AC
  Filled 2023-04-22: qty 1

## 2023-04-22 MED ORDER — SODIUM CHLORIDE 0.9% FLUSH: 3.0000 mL | INTRAVENOUS | Status: DC | PRN

## 2023-04-22 MED ORDER — LORATADINE 10 MG PO TABS
10.0000 mg | ORAL_TABLET | Freq: Every day | ORAL | Status: DC | PRN
  Administered 2023-04-23: 10 mg via ORAL
  Filled 2023-04-22: qty 1

## 2023-04-22 MED ORDER — MIDAZOLAM HCL 2 MG/2ML IJ SOLN
INTRAMUSCULAR | Status: AC
  Filled 2023-04-22: qty 2

## 2023-04-22 MED ORDER — LIDOCAINE HCL (CARDIAC) PF 100 MG/5ML IV SOSY
PREFILLED_SYRINGE | INTRAVENOUS | Status: DC | PRN
  Administered 2023-04-22: 40 mg via INTRAVENOUS

## 2023-04-22 MED ORDER — CEFAZOLIN SODIUM-DEXTROSE 2-4 GM/100ML-% IV SOLN
INTRAVENOUS | Status: AC
  Filled 2023-04-22: qty 100

## 2023-04-22 MED ORDER — COLCHICINE 0.6 MG PO TABS: 0.6000 mg | ORAL_TABLET | Freq: Two times a day (BID) | ORAL | Status: DC | PRN

## 2023-04-22 MED ORDER — PROPOFOL 10 MG/ML IV BOLUS
INTRAVENOUS | Status: DC | PRN
  Administered 2023-04-22: 200 mg via INTRAVENOUS

## 2023-04-22 MED ORDER — GLYCOPYRROLATE 0.2 MG/ML IJ SOLN
INTRAMUSCULAR | Status: DC | PRN
  Administered 2023-04-22: .2 mg via INTRAVENOUS

## 2023-04-22 MED ORDER — ENOXAPARIN SODIUM 40 MG/0.4ML IJ SOSY
40.0000 mg | PREFILLED_SYRINGE | INTRAMUSCULAR | Status: DC
  Administered 2023-04-23: 40 mg via SUBCUTANEOUS
  Filled 2023-04-22: qty 0.4

## 2023-04-22 MED ORDER — OXYCODONE HCL 5 MG PO TABS
10.0000 mg | ORAL_TABLET | ORAL | Status: DC | PRN
  Administered 2023-04-22 – 2023-04-23 (×3): 10 mg via ORAL
  Filled 2023-04-22 (×3): qty 2

## 2023-04-22 MED ORDER — LORAZEPAM 2 MG/ML IJ SOLN
2.0000 mg | Freq: Once | INTRAMUSCULAR | Status: AC
Start: 2023-04-22 — End: 2023-04-22
  Administered 2023-04-22: 2 mg via INTRAVENOUS

## 2023-04-22 MED ORDER — ONDANSETRON HCL 4 MG/2ML IJ SOLN
INTRAMUSCULAR | Status: DC | PRN
  Administered 2023-04-22: 4 mg via INTRAVENOUS

## 2023-04-22 MED ORDER — DULOXETINE HCL 30 MG PO CPEP
60.0000 mg | ORAL_CAPSULE | Freq: Every day | ORAL | Status: DC
  Administered 2023-04-23: 60 mg via ORAL
  Filled 2023-04-22: qty 2

## 2023-04-22 MED ORDER — PHENOL 1.4 % MT LIQD: 1.0000 | OROMUCOSAL | Status: DC | PRN

## 2023-04-22 MED ORDER — ONDANSETRON HCL 4 MG/2ML IJ SOLN: 4.0000 mg | Freq: Four times a day (QID) | INTRAMUSCULAR | Status: DC | PRN

## 2023-04-22 MED ORDER — SALINE SPRAY 0.65 % NA SOLN: 2.0000 | NASAL | Status: DC | PRN

## 2023-04-22 MED ORDER — MAGNESIUM CITRATE PO SOLN: 1.0000 | Freq: Once | ORAL | Status: DC | PRN

## 2023-04-22 MED ORDER — LACTATED RINGERS IV SOLN: INTRAVENOUS | Status: DC

## 2023-04-22 MED ORDER — METOPROLOL TARTRATE 25 MG PO TABS
ORAL_TABLET | ORAL | Status: AC
  Filled 2023-04-22: qty 2

## 2023-04-22 MED ORDER — OXYCODONE HCL 5 MG/5ML PO SOLN: 5.0000 mg | Freq: Once | ORAL | Status: AC | PRN

## 2023-04-22 MED ORDER — PHENYLEPHRINE 80 MCG/ML (10ML) SYRINGE FOR IV PUSH (FOR BLOOD PRESSURE SUPPORT)
PREFILLED_SYRINGE | INTRAVENOUS | Status: DC | PRN
  Administered 2023-04-22 (×2): 160 ug via INTRAVENOUS
  Administered 2023-04-22: 80 ug via INTRAVENOUS
  Administered 2023-04-22: 160 ug via INTRAVENOUS

## 2023-04-22 MED ORDER — KETOROLAC TROMETHAMINE 15 MG/ML IJ SOLN
INTRAMUSCULAR | Status: AC
  Filled 2023-04-22: qty 1

## 2023-04-22 MED ORDER — CHLORHEXIDINE GLUCONATE 0.12 % MT SOLN
OROMUCOSAL | Status: AC
Start: 2023-04-22 — End: ?
  Filled 2023-04-22: qty 15

## 2023-04-22 MED ORDER — HYDROMORPHONE HCL 1 MG/ML IJ SOLN
INTRAMUSCULAR | Status: DC | PRN
  Administered 2023-04-22 (×2): .5 mg via INTRAVENOUS

## 2023-04-22 MED ORDER — SODIUM CHLORIDE 0.9 % IV SOLN
INTRAVENOUS | Status: DC | PRN
  Administered 2023-04-22: .1 ug/kg/min via INTRAVENOUS

## 2023-04-22 MED ORDER — CEFAZOLIN SODIUM-DEXTROSE 3-4 GM/150ML-% IV SOLN
3.0000 g | INTRAVENOUS | Status: AC
  Administered 2023-04-22: 2 g via INTRAVENOUS
  Filled 2023-04-22: qty 150

## 2023-04-22 MED ORDER — CHLORHEXIDINE GLUCONATE 0.12 % MT SOLN
15.0000 mL | Freq: Once | OROMUCOSAL | Status: AC
  Administered 2023-04-22: 15 mL via OROMUCOSAL

## 2023-04-22 MED ORDER — OXYCODONE HCL 5 MG PO TABS: 5.0000 mg | ORAL_TABLET | ORAL | Status: DC | PRN

## 2023-04-22 MED ORDER — MUPIROCIN 2 % EX OINT
1.0000 | TOPICAL_OINTMENT | Freq: Two times a day (BID) | CUTANEOUS | 0 refills | Status: AC
Start: 2023-04-22 — End: 2023-05-22

## 2023-04-22 MED ORDER — 0.9 % SODIUM CHLORIDE (POUR BTL) OPTIME
TOPICAL | Status: DC | PRN
Start: 2023-04-22 — End: 2023-04-22
  Administered 2023-04-22: 500 mL

## 2023-04-22 MED ORDER — SODIUM CHLORIDE 0.9% FLUSH
3.0000 mL | Freq: Two times a day (BID) | INTRAVENOUS | Status: DC
  Administered 2023-04-22 – 2023-04-23 (×2): 3 mL via INTRAVENOUS

## 2023-04-22 MED ORDER — OXYCODONE HCL 5 MG PO TABS
ORAL_TABLET | ORAL | Status: AC
  Filled 2023-04-22: qty 1

## 2023-04-22 MED ORDER — METHOCARBAMOL 500 MG PO TABS
500.0000 mg | ORAL_TABLET | Freq: Four times a day (QID) | ORAL | Status: DC | PRN
  Administered 2023-04-22 – 2023-04-23 (×3): 500 mg via ORAL
  Filled 2023-04-22 (×2): qty 1

## 2023-04-22 MED ORDER — DOCUSATE SODIUM 100 MG PO CAPS
100.0000 mg | ORAL_CAPSULE | Freq: Two times a day (BID) | ORAL | Status: DC
  Administered 2023-04-23: 100 mg via ORAL
  Filled 2023-04-22: qty 1

## 2023-04-22 MED ORDER — PROPOFOL 10 MG/ML IV BOLUS
INTRAVENOUS | Status: AC
  Filled 2023-04-22: qty 20

## 2023-04-22 MED ORDER — ORAL CARE MOUTH RINSE: 15.0000 mL | Freq: Once | OROMUCOSAL | Status: AC

## 2023-04-22 MED ORDER — HYDROMORPHONE HCL 1 MG/ML IJ SOLN: 1.0000 mg | INTRAMUSCULAR | Status: AC | PRN

## 2023-04-22 MED ORDER — BUPIVACAINE-EPINEPHRINE (PF) 0.5% -1:200000 IJ SOLN
INTRAMUSCULAR | Status: DC | PRN
  Administered 2023-04-22: 10 mL

## 2023-04-22 MED ORDER — CHLORHEXIDINE GLUCONATE 4 % EX SOLN
1.0000 | CUTANEOUS | 1 refills | Status: AC
Start: 2023-04-22 — End: ?

## 2023-04-22 MED ORDER — FENTANYL CITRATE (PF) 100 MCG/2ML IJ SOLN
INTRAMUSCULAR | Status: DC | PRN
  Administered 2023-04-22: 100 ug via INTRAVENOUS

## 2023-04-22 MED ORDER — MIDAZOLAM HCL 2 MG/2ML IJ SOLN
INTRAMUSCULAR | Status: DC | PRN
  Administered 2023-04-22: 2 mg via INTRAVENOUS

## 2023-04-22 MED ORDER — CEFAZOLIN IN SODIUM CHLORIDE 2-0.9 GM/100ML-% IV SOLN
3.0000 g | Freq: Once | INTRAVENOUS | Status: DC
  Filled 2023-04-22: qty 200

## 2023-04-22 MED ORDER — MENTHOL 3 MG MT LOZG: 1.0000 | LOZENGE | OROMUCOSAL | Status: DC | PRN

## 2023-04-22 MED ORDER — DEXAMETHASONE SODIUM PHOSPHATE 10 MG/ML IJ SOLN
INTRAMUSCULAR | Status: DC | PRN
  Administered 2023-04-22: 10 mg via INTRAVENOUS

## 2023-04-22 MED ORDER — ACETAMINOPHEN 10 MG/ML IV SOLN
INTRAVENOUS | Status: AC
  Filled 2023-04-22: qty 100

## 2023-04-22 MED ORDER — HYDROMORPHONE HCL 1 MG/ML IJ SOLN
0.5000 mg | INTRAMUSCULAR | Status: AC | PRN
  Administered 2023-04-22 (×4): 0.5 mg via INTRAVENOUS

## 2023-04-22 MED ORDER — FENTANYL CITRATE (PF) 100 MCG/2ML IJ SOLN
INTRAMUSCULAR | Status: AC
  Filled 2023-04-22: qty 2

## 2023-04-22 MED ORDER — LORAZEPAM 2 MG/ML IJ SOLN
INTRAMUSCULAR | Status: AC
  Filled 2023-04-22: qty 1

## 2023-04-22 MED ORDER — ACETAMINOPHEN 650 MG RE SUPP: 650.0000 mg | RECTAL | Status: DC | PRN

## 2023-04-22 MED ORDER — SURGIFLO WITH THROMBIN (HEMOSTATIC MATRIX KIT) OPTIME
TOPICAL | Status: DC | PRN
  Administered 2023-04-22: 1 via TOPICAL

## 2023-04-22 SURGICAL SUPPLY — 42 items
ALLOGRAFT BONE FIBER KORE 1CC (Bone Implant) IMPLANT
BASIN KIT SINGLE STR (MISCELLANEOUS) ×1 IMPLANT
BUR NEURO DRILL SOFT 3.0X3.8M (BURR) ×1 IMPLANT
DERMABOND ADVANCED .7 DNX12 (GAUZE/BANDAGES/DRESSINGS) ×1 IMPLANT
DRAIN CHANNEL JP 10F RND 20C F (MISCELLANEOUS) IMPLANT
DRAPE C ARM PK CFD 31 SPINE (DRAPES) ×1 IMPLANT
DRAPE LAPAROTOMY 77X122 PED (DRAPES) ×1 IMPLANT
DRAPE MICROSCOPE SPINE 48X150 (DRAPES) ×1 IMPLANT
DRSG TEGADERM 4X4.75 (GAUZE/BANDAGES/DRESSINGS) IMPLANT
ELECT REM PT RETURN 9FT ADLT (ELECTROSURGICAL) ×1 IMPLANT
ELECTRODE REM PT RTRN 9FT ADLT (ELECTROSURGICAL) ×1 IMPLANT
EVACUATOR 1/8 PVC DRAIN (DRAIN) IMPLANT
EVACUATOR SILICONE 100CC (DRAIN) IMPLANT
GAUZE SPONGE 2X2 STRL 8-PLY (GAUZE/BANDAGES/DRESSINGS) IMPLANT
GLOVE BIOGEL PI IND STRL 6.5 (GLOVE) ×1 IMPLANT
GLOVE SURG SYN 6.5 ES PF (GLOVE) ×6 IMPLANT
GLOVE SURG SYN 6.5 PF PI (GLOVE) ×1 IMPLANT
GLOVE SURG SYN 8.5 E (GLOVE) ×3 IMPLANT
GLOVE SURG SYN 8.5 PF PI (GLOVE) ×3 IMPLANT
GOWN SRG LRG LVL 4 IMPRV REINF (GOWNS) ×1 IMPLANT
GOWN SRG XL LVL 3 NONREINFORCE (GOWNS) ×1 IMPLANT
KIT TURNOVER KIT A (KITS) ×1 IMPLANT
MANIFOLD NEPTUNE II (INSTRUMENTS) ×1 IMPLANT
NDL SAFETY ECLIPSE 18X1.5 (NEEDLE) ×1 IMPLANT
NS IRRIG 500ML POUR BTL (IV SOLUTION) ×1 IMPLANT
PACK LAMINECTOMY ARMC (PACKS) ×1 IMPLANT
PAD ARMBOARD POSITIONER FOAM (MISCELLANEOUS) ×2 IMPLANT
PIN CASPAR 14 (PIN) ×1 IMPLANT
PIN CASPAR 14MM (PIN) ×1 IMPLANT
PLATE SPINAL ACP 78 4L 2.1H (Plate) IMPLANT
SCREW ACP 3.5X17 S/D VARIA (Screw) IMPLANT
SPACER C HEDRON 12X14 7M 7D (Spacer) IMPLANT
SPONGE KITTNER 5P (MISCELLANEOUS) ×1 IMPLANT
STAPLER SKIN PROX 35W (STAPLE) IMPLANT
SURGIFLO W/THROMBIN 8M KIT (HEMOSTASIS) ×1 IMPLANT
SUT ETHILON 3-0 FS-10 30 BLK (SUTURE) IMPLANT
SUT STRATA 3-0 15 PS-2 (SUTURE) ×1 IMPLANT
SUT VIC AB 3-0 SH 8-18 (SUTURE) ×1 IMPLANT
SUTURE EHLN 3-0 FS-10 30 BLK (SUTURE) IMPLANT
SYR 20ML LL LF (SYRINGE) ×1 IMPLANT
TAPE CLOTH 3X10 WHT NS LF (GAUZE/BANDAGES/DRESSINGS) ×2 IMPLANT
TRAP FLUID SMOKE EVACUATOR (MISCELLANEOUS) ×1 IMPLANT

## 2023-04-22 NOTE — Discharge Instructions (Signed)
 Your surgeon has performed an operation on your cervical spine (neck) to relieve pressure on the spinal cord and/or nerves. This involved making an incision in the front of your neck and removing one or more of the discs that support your spine. Next, a small piece of bone, a titanium plate, and screws were used to fuse two or more of the vertebrae (bones) together.  The following are instructions to help in your recovery once you have been discharged from the hospital. Even if you feel well, it is important that you follow these activity guidelines. If you do not let your neck heal properly from the surgery, you can increase the chance of return of your symptoms and other complications.  * Do not take anti-inflammatory medications for 3 months after surgery (naproxen [Aleve], ibuprofen [Advil, Motrin], etc.). These medications can prevent your bones from healing properly.  Celebrex, if prescribed, is ok to take.  Activity    No bending, lifting, or twisting ("BLT"). Avoid lifting objects heavier than 10 pounds (gallon milk jug).  Where possible, avoid household activities that involve lifting, bending, reaching, pushing, or pulling such as laundry, vacuuming, grocery shopping, and childcare. Try to arrange for help from friends and family for these activities while your back heals.  Increase physical activity slowly as tolerated.  Taking short walks is encouraged, but avoid strenuous exercise. Do not jog, run, bicycle, lift weights, or participate in any other exercises unless specifically allowed by your doctor.  Talk to your doctor before resuming sexual activity.  You should not drive until cleared by your doctor.  Until released by your doctor, you should not return to work or school.  You should rest at home and let your body heal.   You may shower three days after your surgery.  After showering, lightly dab your incision dry. Do not take a tub bath or go swimming until approved by your  doctor at your follow-up appointment.  If your doctor ordered a cervical collar (neck brace) for you, you should wear it whenever you are out of bed. You may remove it when lying down or sleeping, but you should wear it at all other times. Not all neck surgeries require a cervical collar.  If you smoke, we strongly recommend that you quit.  Smoking has been proven to interfere with normal bone healing and will dramatically reduce the success rate of your surgery. Please contact [facility] (800-QUIT-NOW) and use the resources at www.[facility].com for assistance in stopping smoking.  Surgical Incision   If you have a dressing on your incision, you may remove it two days after your surgery. Keep your incision area clean and dry.  If you have staples or stitches on your incision, you should have a follow up scheduled for removal. If you do not have staples or stitches, you will have steri-strips (small pieces of surgical tape) or Dermabond glue. The steri-strips/glue should begin to peel away within about a week (it is fine if the steri-strips fall off before then). If the strips are still in place one week after your surgery, you may gently remove them.  Diet           You may return to your usual diet. However, you may experience discomfort when swallowing in the first month after your surgery. This is normal. You may find that softer foods are more comfortable for you to swallow. Be sure to stay hydrated.  When to Contact us  You may experience pain in your  neck and/or pain between your shoulder blades. This is normal and should improve in the next few weeks with the help of pain medication, muscle relaxers, and rest. Some patients report that a warm compress on the back of the neck or between the shoulder blades helps.  However, should you experience any of the following, contact us immediately: New numbness or weakness Pain that is progressively getting worse, and is not relieved by your pain  medication, muscle relaxers, rest, and warm compresses Bleeding, redness, swelling, pain, or drainage from surgical incision Chills or flu-like symptoms Fever greater than 101.0 F (38.3 C) Inability to eat, drink fluids, or take medications Problems with bowel or bladder functions Difficulty breathing or shortness of breath Warmth, tenderness, or swelling in your calf Contact Information How to contact us:  If you have any questions/concerns before or after surgery, you can reach [place] at [phone], or you can send a mychart message. We can be reached by phone or mychart 8am-4pm, Monday-Friday.  *Please note: Calls after 4pm are forwarded to a third party answering service. Mychart messages are not routinely monitored during evenings, weekends, and holidays. Please call our office to contact the answering service for urgent concerns during non-business hours.

## 2023-04-22 NOTE — Anesthesia Procedure Notes (Signed)
 Procedure Name: Intubation Date/Time: 04/22/2023 10:30 AM  Performed by: Edmund Hilda, CRNAPre-anesthesia Checklist: Patient identified, Patient being monitored, Timeout performed, Emergency Drugs available and Suction available Patient Re-evaluated:Patient Re-evaluated prior to induction Oxygen Delivery Method: Circle system utilized Preoxygenation: Pre-oxygenation with 100% oxygen Induction Type: IV induction Ventilation: Mask ventilation without difficulty Laryngoscope Size: Mac, 3 and McGrath Grade View: Grade I Tube type: Oral Tube size: 7.5 mm Number of attempts: 1 Airway Equipment and Method: Stylet Placement Confirmation: ETT inserted through vocal cords under direct vision, positive ETCO2 and breath sounds checked- equal and bilateral Secured at: 21 cm Tube secured with: Tape Dental Injury: Teeth and Oropharynx as per pre-operative assessment

## 2023-04-22 NOTE — Anesthesia Preprocedure Evaluation (Signed)
 Anesthesia Evaluation  Patient identified by MRN, date of birth, ID band Patient awake    Reviewed: Allergy & Precautions, NPO status , Patient's Chart, lab work & pertinent test results  History of Anesthesia Complications Negative for: history of anesthetic complications  Airway Mallampati: III  TM Distance: <3 FB Neck ROM: full    Dental  (+) Chipped   Pulmonary neg shortness of breath, former smoker   Pulmonary exam normal        Cardiovascular Exercise Tolerance: Good hypertension, (-) angina (-) Past MI Normal cardiovascular exam     Neuro/Psych  PSYCHIATRIC DISORDERS       Neuromuscular disease    GI/Hepatic Neg liver ROS, PUD,GERD  Controlled,,  Endo/Other  negative endocrine ROS    Renal/GU      Musculoskeletal   Abdominal   Peds  Hematology negative hematology ROS (+)   Anesthesia Other Findings Past Medical History: No date: Anxiety 2025: Cervical radiculopathy 2025: Cervical stenosis of spinal 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, with rectal bleeding (HCC) No date: Colitis No date: Depression No date: Diverticulosis No date: Epididymitis No date: GERD (gastroesophageal reflux disease) No date: Gout No date: Hypertension No date: Prostatitis No date: Rhinitis, allergic 2017: Tubular adenoma  Past Surgical History: 1991: BACK SURGERY     Comment:  laminectomy-L5 09/23/2017: CHOLECYSTECTOMY; N/A     Comment:  Procedure: LAPAROSCOPIC CHOLECYSTECTOMY;  Surgeon:               Carolan Shiver, MD;  Location: [facility] ORS;  Service:              General;  Laterality: N/A; No date: COLONOSCOPY     Comment:  2016 and 2017 2008: edg No date: FUNCTIONAL ENDOSCOPIC SINUS SURGERY 03/13/2022: LUMBAR LAMINECTOMY/ DECOMPRESSION WITH MET-RX; Left     Comment:  Procedure: LUMBAR LAMINECTOMY/ DECOMPRESSION WITH               MET-RX;  Surgeon: Venetia Night, MD;  Location: [facility]               ORS;  Service: Neurosurgery;  Laterality: Left;  lumbar               laminectomy L2-3 for resection of epidural lesion No date: TONSILLECTOMY  BMI    Body Mass Index: 37.02 kg/m      Reproductive/Obstetrics negative OB ROS                             Anesthesia Physical Anesthesia Plan  ASA: 3  Anesthesia Plan: General ETT   Post-op Pain Management:    Induction: Intravenous  PONV Risk Score and Plan: Ondansetron, Dexamethasone, Midazolam and Treatment may vary due to age or medical condition  Airway Management Planned: Oral ETT  Additional Equipment:   Intra-op Plan:   Post-operative Plan: Extubation in OR  Informed Consent: I have reviewed the patients History and Physical, chart, labs and discussed the procedure including the risks, benefits and alternatives for the proposed anesthesia with the patient or authorized representative who has indicated his/her understanding and acceptance.     Dental Advisory Given  Plan Discussed with: Anesthesiologist, CRNA and Surgeon  Anesthesia Plan Comments: (Patient consented for risks of anesthesia including but not limited to:  - adverse reactions to medications - damage to eyes, teeth, lips or other oral mucosa - nerve damage due to positioning  - sore throat or hoarseness - Damage to heart, brain, nerves,  lungs, other parts of body or loss of life  Patient voiced understanding and assent.)       Anesthesia Quick Evaluation

## 2023-04-22 NOTE — Transfer of Care (Signed)
 Immediate Anesthesia Transfer of Care Note  Patient: Rodney Cisneros [facility]  Procedure(s) Performed: ANTERIOR CERVICAL DECOMPRESSION/DISCECTOMY FUSION 4 LEVELS  Patient Location: PACU  Anesthesia Type:General  Level of Consciousness: awake and alert   Airway & Oxygen Therapy: Patient Spontanous Breathing and Patient connected to face mask oxygen  Post-op Assessment: Report given to RN and Post -op Vital signs reviewed and stable  Post vital signs: Reviewed and stable  Last Vitals:  Vitals Value Taken Time  BP 153/106 04/22/23 1402  Temp 36.2 C 04/22/23 1402  Pulse 118 04/22/23 1408  Resp 19 04/22/23 1407  SpO2 97 % 04/22/23 1408  Vitals shown include unfiled device data.  Last Pain:  Vitals:   04/22/23 0821  TempSrc: Tympanic  PainSc: 7          Complications: No notable events documented.

## 2023-04-22 NOTE — Progress Notes (Signed)
 Dr. Ronni Rumble notified of continued tremulousness in extremities. Moving extremities x 4. A&O x 4. States, "this morning before I got here I felt like I was going to have a panic attack, and this sort of feels like that." Partner updated by patient via phone. Ativan ordered.

## 2023-04-22 NOTE — Addendum Note (Signed)
 Addendum  created 04/22/23 1524 by Otho Perl, CRNA   Intraprocedure Meds edited

## 2023-04-22 NOTE — Op Note (Signed)
 Indications: [name] is a 58 y.o. male with M54.12 cervical radiculopathy, M48.02 cervical spinal stenosis   He failed conservative management prompting surgical intervention.  Findings: stenosis, successful decompression  Preoperative Diagnosis: M54.12 cervical radiculopathy, M48.02 cervical spinal stenosis  Postoperative Diagnosis: same   EBL: 50 ml IVF: see AR Drains: one Disposition: Extubated and Stable to PACU Complications: none  A foley catheter was placed.   Preoperative Note:     Preoperative Note:   Risks of surgery discussed include: infection, bleeding, stroke, coma, death, paralysis, CSF leak, nerve/spinal cord injury, numbness, tingling, weakness, complex regional pain syndrome, recurrent stenosis and/or disc herniation, vascular injury, development of instability, neck/back pain, need for further surgery, persistent symptoms, development of deformity, and the risks of anesthesia. The patient understood these risks and agreed to proceed.  Operative Note:   Procedure: 1. Anterior Cervical Discectomy C3-4 including bilateral foraminotomies and end plate preparation  2. Anterior Cervical Discectomy C4-5 including bilateral foraminotomies and end plate preparation  3. Anterior Cervical Discectomy C5-6 including bilateral foraminotomies and end plate preparation  4. Anterior Cervical Discectomy C6-7 including bilateral foraminotomies and end plate preparation 5. Anterior Spinal Instrumentation C3 to 7  6. Anterior arthrodesis from C3 to C7 with placement of biomechanical devices at C3-4, C4-5, C5-6, and C6-7 7. Use of the operative microscope 8. Use of intraoperative flouroscopy  PROCEDURE IN DETAIL: After obtaining informed consent, the patient taken to the operating room, placed in supine position, general anesthesia induced.  The patient had a small shoulder roll placed behind their shoulders.  The patient received preop antibiotics and IV Decadron.  The  patient had a neck incision outlined, was prepped and draped in usual sterile fashion. A timeout was performed. The incision was injected with local anesthetic.   An incision was opened, dissection taken down medial to the carotid artery and jugular vein, lateral to the trachea and esophagus.  The prevertebral fascia identified and a localizing x-ray demonstrated the correct level.  The longus colli were dissected laterally, and self-retaining retractors placed to open the operative field. The microscope was then brought into the field.    Caspar pins were placed into the vertebral bodies at C3 and C5. The distractor was then placed at C3-5, and the annuli at C3-4 and C4-5 were opened using a bovie.  Curettes and pituitary rongeurs used to remove the majority of disk, then the drill was used to remove the posterior osteophyte and begin the foraminotomies. The nerve hook was used to elevate the posterior longitudinal ligament, which was then removed with Kerrison rongeurs. The microblunt nerve hook could be passed out the foramen bilaterally at each level.   Meticulous hemostasis was obtained. A biomechanical device (Globus Hedron 7 mm height x 14 mm width by 12 mm depth) was placed at C3-4. A second biomechanical device (Globus Hedron 7 mm height x 14 mm width by 12 mm depth) was placed at C4-5. Each device had been filled with allograft for aid in arthrodesis.      The distractor was removed, and the Blackbelt retractor repositioned. The pin at C3 was removed and bone wax was used for hemostasis. An additional pin was placed at C7.  The distractor was placed from C5-7, and the annuli at C5-6 and C6-7 were opened using a bovie.  Curettes and pituitary rongeurs used to remove the majority of disk, then the drill was used to remove the posterior osteophyte and begin the foraminotomies. The nerve hook was used to elevate  the posterior longitudinal ligament, which was then removed with Kerrison rongeurs. The  microblunt nerve hook could be passed out the foramen bilaterally at each level.   Meticulous hemostasis was obtained. A biomechanical device (Globus Hedron 7 mm height x 14 mm width by 12 mm depth) was placed at C5-6. A second biomechanical device (Globus Hedron 7 mm height x 14 mm width by 12 mm depth) was placed at C6-7. Each device had been filled with allograft for aid in arthrodesis.  The caspar distractor was removed and the pins at C5 and C7 were removed.  Bone wax was used for hemostasis.    A separate five segment, four level plate (74 mm Nuvasive ACP) was chosen.  Two screws placed in the vertebral bodies of all four segments, respectively making sure the screws were behind the locking mechanism.  Final AP and lateral radiographs were taken.   Please note that the plate is not inclusive to the biomechanical devices.  The anchoring mechanism of the plate is completely separate from the biomechanical devices.  With everything in good position, the wound was irrigated copiously with bacitracin-containing solution and meticulous hemostasis obtained.  Wound was closed in 2 layers using interrupted inverted 3-0 Vicryl sutures in the platysma and 3-0 monocryl in the skin.  The wound was dressed with dermabond, the head of bed at 30 degrees, taken to recovery room in stable condition.  No new postop neurological deficits were identified.  All counts were correct at the end of the case.  I performed the entire procedure with the assistance of Manning Charity PA as an Designer, television/film set. An assistant was required for this procedure due to the complexity.  The assistant provided assistance in tissue manipulation and suction, and was required for the successful and safe performance of the procedure. I performed the critical portions of the procedure.   Venetia Night MD

## 2023-04-22 NOTE — H&P (Signed)
 REFERRING PHYSICIAN:  Venetia Night, Md [address] [place],  [place] [address]  DOS: 03/13/22  L2-3 lumbar laminectomy for resection of spinal mass, including use of microscope   HISTORY OF PRESENT ILLNESS: 04/22/2023 Rodney Cisneros returns with continued symptoms.    03/17/2023 L C5/6 ESI 12/24/2022  Rodney Cisneros continues to have significant pain, numbness, and tingling in his arms. He tried physical therapy without improvement.  The injection did not help. 12/09/2022 Rodney Cisneros continues to have symptoms.  He has had significant flares with his psoriasis.  He continues to have numbness in his left hand.  He is having back and leg discomfort particularly in his left foot extending down to the top of his foot.  10/07/2022 Rodney Cisneros is status post L2-3 lumbar decompression and resection of mass (likely disc herniation).  Over the past week, he has had severe pain in his left lower back and down the back of his left leg.  He denies pain on his anterior thigh.  He denies numbness.  Coughing or sneezing makes it worse.  He is having some difficulty with work due to this level of pain.  He previously was doing quite well.  He also has had some pain in his left shoulder blade.  He reports some problems with his balance.   History reviewed. No pertinent family history.  Social History   Socioeconomic History   Marital status: Significant Other    Spouse name: Rodney Cisneros,Rodney Cisneros (Significant other)   Number of children: Not on file   Years of education: Not on file   Highest education level: Not on file  Occupational History   Not on file  Tobacco Use   Smoking status: Former   Smokeless tobacco: Never  Vaping Use   Vaping status: Never Used  Substance and Sexual Activity   Alcohol use: Yes    Comment: occasionally   Drug use: Never   Sexual activity: Not on file  Other Topics Concern   Not on file  Social History Narrative   Not on file   Social  Drivers of Health   Financial Resource Strain: Not on file  Food Insecurity: No Food Insecurity (03/13/2022)   Hunger Vital Sign    Worried About Running Out of Food in the Last Year: Never true    Ran Out of Food in the Last Year: Never true  Transportation Needs: No Transportation Needs (03/13/2022)   PRAPARE - Administrator, Civil Service (Medical): No    Lack of Transportation (Non-Medical): No  Physical Activity: Not on file  Stress: Not on file  Social Connections: Not on file  Intimate Partner Violence: Not At Risk (03/13/2022)   Humiliation, Afraid, Rape, and Kick questionnaire    Fear of Current or Ex-Partner: No    Emotionally Abused: No    Physically Abused: No    Sexually Abused: No    Current Outpatient Medications  Medication Instructions   cholecalciferol (VITAMIN D) 1,000 Units, Daily   colchicine 0.6 mg, Oral, 2 times daily PRN   DULoxetine (CYMBALTA) 60 mg, Daily   lamoTRIgine (LAMICTAL) 100 mg, Oral, Daily   loratadine (CLARITIN) 10 mg, Daily PRN   metoprolol tartrate (LOPRESSOR) 50 mg, 2 times daily   Multiple Vitamin (MULTIVITAMIN WITH MINERALS) TABS tablet 1 tablet, Daily   omeprazole (PRILOSEC) 20 mg, Oral, 2 times daily before meals   oxyCODONE (ROXICODONE) 5 mg, Oral, 2 times daily PRN   sodium chloride (OCEAN) 0.65 %  SOLN nasal spray 2 sprays, Each Nare, Every 2 hours while awake   Allergies  Allergen Reactions   Sulfa Antibiotics Hives      PHYSICAL EXAMINATION:   Heart sounds normal no MRG. Chest Clear to Auscultation Bilaterally.  Awake, alert, oriented to person, place, and time.  Speech is clear and fluent. Fund of knowledge is appropriate.   Cranial Nerves: Pupils equal round and reactive to light.  Facial tone is symmetric.    No posterior cervical tenderness. No tenderness in bilateral trapezial region.   He has thoracic tenderness between the shoulder blades and in the scapular region bilaterally.   No posterior lumbar  tenderness.  Lumbar incision is well healed.   No abnormal lesions on exposed skin.   Strength: Side Biceps Triceps Deltoid Interossei Grip Wrist Ext. Wrist Flex.  R 5 5 5 5  4+ 5 5  L 5 5 5 5  4+ 5 5   Side Iliopsoas Quads Hamstring PF DF EHL  R 5 5 5 5 5 5   L 4+ 5 5 5 5 5     Bilateral upper and lower extremity sensation is intact to light touch.       ROS (Neurologic):  Negative except as noted above  IMAGING: MRI CL spine 11/01/2022 IMPRESSION: 1. Successful decompression at L2-3 with no recurrent epidural lesion or thecal sac stenosis. Chronic left foraminal protrusion at this level with patent foramen. 2. L3-4 moderate narrowing of the thecal sac due to degeneration, epidural fat, and short pedicles. 3. L5-S1 chronic moderate left foraminal stenosis from degeneration.     Electronically Signed   By: Tiburcio Pea M.D.   On: 11/26/2022 18:11  IMPRESSION: 1. At least moderate severity bilateral degenerative foraminal impingement at C3-4 to C6-7, sparing the left foramen at C5-6. 2. Diffusely patent spinal canal.     Electronically Signed   By: Tiburcio Pea M.D.   On: 11/26/2022 18:05   ASSESSMENT/PLAN:  Rodney Cisneros has had a setback after his surgery.    In his neck, he has multilevel foraminal stenosis from C3-C7.  This is at least moderate in nature at every level.  He has tried and failed conservative management.  We will proceed with C3-7 anterior cervical discectomy and fusion.  Venetia Night Department of neurosurgery

## 2023-04-22 NOTE — Anesthesia Postprocedure Evaluation (Signed)
 Anesthesia Post Note  Patient: Rodney Cisneros  Procedure(s) Performed: ANTERIOR CERVICAL DECOMPRESSION/DISCECTOMY FUSION 4 LEVELS  Patient location during evaluation: PACU Anesthesia Type: General Level of consciousness: awake and alert Pain management: pain level controlled Vital Signs Assessment: post-procedure vital signs reviewed and stable Respiratory status: spontaneous breathing, nonlabored ventilation, respiratory function stable and patient connected to nasal cannula oxygen Cardiovascular status: blood pressure returned to baseline and stable Postop Assessment: no apparent nausea or vomiting Anesthetic complications: no  No notable events documented.   Last Vitals:  Vitals:   04/22/23 1445 04/22/23 1500  BP: (!) 149/78 (!) 147/68  Pulse: (!) 114 (!) 115  Resp: (!) 22 (!) 23  Temp:    SpO2: 94% 95%    Last Pain:  Vitals:   04/22/23 1503  TempSrc:   PainSc: 8                  Stephanie Coup

## 2023-04-23 ENCOUNTER — Encounter: Payer: Self-pay | Admitting: Neurosurgery

## 2023-04-23 ENCOUNTER — Other Ambulatory Visit: Payer: Self-pay

## 2023-04-23 MED ORDER — HYDROMORPHONE HCL 2 MG PO TABS
2.0000 mg | ORAL_TABLET | ORAL | Status: DC | PRN
  Administered 2023-04-23: 2 mg via ORAL
  Filled 2023-04-23: qty 1

## 2023-04-23 MED ORDER — SENNA 8.6 MG PO TABS
1.0000 | ORAL_TABLET | Freq: Two times a day (BID) | ORAL | 0 refills | Status: AC
  Filled 2023-04-23: qty 30, 15d supply, fill #0

## 2023-04-23 MED ORDER — CELECOXIB 100 MG PO CAPS
100.0000 mg | ORAL_CAPSULE | Freq: Two times a day (BID) | ORAL | 0 refills | Status: DC
  Filled 2023-04-23: qty 60, 30d supply, fill #0

## 2023-04-23 MED ORDER — HYDROMORPHONE HCL 2 MG PO TABS
2.0000 mg | ORAL_TABLET | ORAL | 0 refills | Status: DC | PRN
  Filled 2023-04-23: qty 30, 5d supply, fill #0

## 2023-04-23 MED ORDER — METHOCARBAMOL 500 MG PO TABS
500.0000 mg | ORAL_TABLET | Freq: Four times a day (QID) | ORAL | 0 refills | Status: DC | PRN
  Filled 2023-04-23: qty 120, 30d supply, fill #0

## 2023-04-23 MED ORDER — ACETAMINOPHEN 500 MG PO TABS: 1000.0000 mg | ORAL_TABLET | Freq: Four times a day (QID) | ORAL | Status: AC | PRN

## 2023-04-23 MED ORDER — METHYLPREDNISOLONE 4 MG PO TBPK
ORAL_TABLET | ORAL | 0 refills | Status: DC
  Filled 2023-04-23: qty 21, 6d supply, fill #0

## 2023-04-23 NOTE — Discharge Summary (Signed)
 Discharge Summary  Patient ID: Rodney Cisneros [facility] MRN: [id number] DOB/AGE: 1965/02/13 58 y.o.  Admit date: 04/22/2023 Discharge date: 04/23/2023  Admission Diagnoses: Cervical stenosis Discharge Diagnoses:  Principal Problem:   S/P cervical spinal fusion Active Problems:   Cervical radiculopathy   Cervical spinal stenosis   Discharged Condition: stable  Hospital Course: Mr. [place]ir[place] is a pleasant 58 year old gentleman with cervical spinal stenosis who failed conservative management prompting surgical intervention.  On day of surgery patient underwent C3-7 ACDF without any intraoperative complications.  Postop day 1 patient was doing well.  He experienced some right arm weakness compared to pre-op. His drain output decreased to an acceptable level and was removed on the afternoon of POD1. He was given prescriptions for MDP, Celebrex, Senna, Robaxin and Dilaudid to take as needed for pain  Consults: None  Significant Diagnostic Studies: None  Treatments: IV hydration, antibiotics: Ancef, analgesia: Dilaudid and oxycodone, and therapies: PT and OT  Discharge Exam: Blood pressure (!) 159/70, pulse 91, temperature 98.2 F (36.8 C), temperature source Oral, resp. rate 17, height 6' (1.829 m), weight 123.8 kg, SpO2 93%.      Side Biceps Triceps Deltoid Interossei Grip Wrist Ext. Wrist Flex.  R 4 4 4 5  4+ 5 5  L 5 5 5 5  4+ 5 5     Disposition: Discharge disposition: 01-Home or Self Care       Discharge Instructions     Incentive spirometry RT   Complete by: As directed       Allergies as of 04/23/2023       Reactions   Sulfa Antibiotics Hives        Medication List     STOP taking these medications    oxyCODONE 5 MG immediate release tablet Commonly known as: Roxicodone       TAKE these medications    acetaminophen 500 MG tablet Commonly known as: TYLENOL Take 2 tablets (1,000 mg total) by mouth every 6 (six) hours as needed for mild pain (pain score  1-3).   celecoxib 100 MG capsule Commonly known as: CeleBREX Take 1 capsule (100 mg total) by mouth 2 (two) times daily. Start after completing steroids Start taking on: April 30, 2023   chlorhexidine 4 % external liquid Commonly known as: HIBICLENS Apply 15 mLs (1 Application total) topically as directed for 30 doses. Use as directed daily for 5 days every other week for 6 weeks.   cholecalciferol 1000 units tablet Commonly known as: VITAMIN D Take 1,000 Units by mouth daily.   colchicine 0.6 MG tablet Take 0.6 mg by mouth 2 (two) times daily as needed (gout flare).   DULoxetine 60 MG capsule Commonly known as: CYMBALTA Take 60 mg by mouth daily.   HYDROmorphone 2 MG tablet Commonly known as: DILAUDID Take 1 tablet (2 mg total) by mouth every 4 (four) hours as needed for severe pain (pain score 7-10).   lamoTRIgine 100 MG tablet Commonly known as: LAMICTAL Take 100 mg by mouth daily.   loratadine 10 MG tablet Commonly known as: CLARITIN Take 10 mg by mouth daily as needed for allergies.   methocarbamol 500 MG tablet Commonly known as: ROBAXIN Take 1 tablet (500 mg total) by mouth every 6 (six) hours as needed for muscle spasms.   methylPREDNISolone 4 MG Tbpk tablet Commonly known as: MEDROL DOSEPAK Take as directed on box   metoprolol tartrate 50 MG tablet Commonly known as: LOPRESSOR Take 50 mg by mouth 2 (two) times daily.  multivitamin with minerals Tabs tablet Take 1 tablet by mouth daily.   mupirocin ointment 2 % Commonly known as: BACTROBAN Place 1 Application into the nose 2 (two) times daily for 60 doses. Use as directed 2 times daily for 5 days every other week for 6 weeks.   omeprazole 20 MG capsule Commonly known as: PRILOSEC Take 1 capsule (20 mg total) by mouth 2 (two) times daily before a meal. What changed: when to take this   senna 8.6 MG Tabs tablet Commonly known as: SENOKOT Take 1 tablet (8.6 mg total) by mouth 2 (two) times daily.    sodium chloride 0.65 % Soln nasal spray Commonly known as: OCEAN Place 2 sprays into both nostrils every 2 (two) hours while awake. What changed:  when to take this reasons to take this        Follow-up Information     Susanne Borders, PA Follow up on 05/07/2023.   Specialty: Neurosurgery Contact information: [address] [place] [address] [phone]                 Signed: Susanne Borders 04/23/2023, 12:29 PM

## 2023-04-23 NOTE — Evaluation (Signed)
 Physical Therapy Evaluation Patient Details Name: Rodney Cisneros MRN: [id number] DOB: [birth year] Today's Date: 04/23/2023  History of Present Illness  Pt is a pleasant 58 y.o. male s/p ACDF C3-C7 on 04/22/23.  Clinical Impression  Pt admitted with above diagnosis. Pt currently with functional limitations due to the deficits listed below (see PT Problem List). Pt received ambulating with OT providing handoff. PTA pt reports being indep with mobility and denying falls or balance issues but just severe cervical radiculopathy pain indicating surgery. Pt completing stairs and ~300' of gait. 150' of gait with RW with normal, reciprocal gait with no need of RW. Pt mildly unsteady with descent on stairs with step through pattern with poor eccentric control. Improved quality on re-attempt with step to pattern. Pt returning gait without AD performing 2.5'/sec and normal reciprocal gait indicative of no need for AD. Pt returned in recliner with all needs in reach. Mild VC's throughout session for limiting cervical rotation with fair carryover. Noted cervical brace in room but pt declining need on education stating he has already been educated on how to use properly. Pt will benefit from f/u recs to ensure safe transition out of hospital and maintained reduce falls risk.       If plan is discharge home, recommend the following: Help with stairs or ramp for entrance;Assist for transportation   Can travel by private vehicle        Equipment Recommendations None recommended by PT  Recommendations for Other Services       Functional Status Assessment Patient has had a recent decline in their functional status and demonstrates the ability to make significant improvements in function in a reasonable and predictable amount of time.     Precautions / Restrictions Precautions Precautions: Cervical;Fall Precaution Booklet Issued: Yes (comment) Recall of Precautions/Restrictions: Intact Precaution/Restrictions  Comments: limit BLT. Cervical brace. Restrictions Weight Bearing Restrictions Per Provider Order: No      Mobility  Bed Mobility               General bed mobility comments: NT Patient Response: Cooperative  Transfers                   General transfer comment: NT    Ambulation/Gait Ambulation/Gait assistance: Supervision Gait Distance (Feet): 300 Feet Assistive device: Rolling walker (2 wheels), None Gait Pattern/deviations: Step-through pattern Gait velocity: 10' gait speed in 4 seconds = 2.5'/sec Gait velocity interpretation: 1.31 - 2.62 ft/sec, indicative of limited community ambulator   General Gait Details: Began session ambulating with RW with little to no use of AD. After stairs ambulating no AD with excellent cadence as listed below. No LOB or unsteadiness.  Stairs Stairs: Yes Stairs assistance: Contact guard assist Stair Management: No rails, Alternating pattern, Step to pattern, Forwards Number of Stairs: 4 (2 trials ascending and descending) General stair comments: began with reciprocal pattern. Initially shaky with eccentric portion with mild imbalance. Encouraged to perform again with step to pattern with improved safety with descent.  Wheelchair Mobility     Tilt Bed Tilt Bed Patient Response: Cooperative  Modified Rankin (Stroke Patients Only)       Balance Overall balance assessment: Mild deficits observed, not formally tested                                           Pertinent Vitals/Pain Pain Assessment Pain Assessment: 0-10 Pain  Score: 7  Pain Location: operative site Pain Descriptors / Indicators: Operative site guarding Pain Intervention(s): Limited activity within patient's tolerance, Monitored during session, Repositioned    Home Living Family/patient expects to be discharged to:: Private residence Living Arrangements: Spouse/significant other Available Help at Discharge: Family Type of Home:  House Home Access: Stairs to enter Entrance Stairs-Rails: None Entrance Stairs-Number of Steps: 5-6   Home Layout: One level Home Equipment: None      Prior Function Prior Level of Function : Independent/Modified Independent;Working/employed;Driving                     Extremity/Trunk Assessment   Upper Extremity Assessment Upper Extremity Assessment: Defer to OT evaluation    Lower Extremity Assessment Lower Extremity Assessment: Generalized weakness    Cervical / Trunk Assessment Cervical / Trunk Assessment: Neck Surgery  Communication   Communication Communication: No apparent difficulties    Cognition Arousal: Alert Behavior During Therapy: WFL for tasks assessed/performed, Anxious   PT - Cognitive impairments: No apparent impairments                         Following commands: Intact       Cueing       General Comments      Exercises Other Exercises Other Exercises: Role of PT in acute setting, discussed log roll and BLT precautions. Pt declining need to review donning/doffing cervical collar stating he has already been educated how to don and doff.   Assessment/Plan    PT Assessment Patient needs continued PT services  PT Problem List Decreased strength;Decreased balance       PT Treatment Interventions DME instruction;Therapeutic exercise;Gait training;Balance training;Stair training;Neuromuscular re-education;Functional mobility training;Therapeutic activities;Patient/family education    PT Goals (Current goals can be found in the Care Plan section)  Acute Rehab PT Goals Patient Stated Goal: improve pain PT Goal Formulation: With patient Time For Goal Achievement: 05/07/23 Potential to Achieve Goals: Fair    Frequency 7X/week     Co-evaluation               AM-PAC PT "6 Clicks" Mobility  Outcome Measure Help needed turning from your back to your side while in a flat bed without using bedrails?: A Little Help  needed moving from lying on your back to sitting on the side of a flat bed without using bedrails?: A Little Help needed moving to and from a bed to a chair (including a wheelchair)?: A Little Help needed standing up from a chair using your arms (e.g., wheelchair or bedside chair)?: A Little Help needed to walk in hospital room?: None Help needed climbing 3-5 steps with a railing? : None 6 Click Score: 20    End of Session Equipment Utilized During Treatment: Gait belt Activity Tolerance: Patient tolerated treatment well Patient left: in chair;with call bell/phone within reach;with family/visitor present Nurse Communication: Mobility status PT Visit Diagnosis: Unsteadiness on feet (R26.81);Muscle weakness (generalized) (M62.81)    Time: 8657-8469 PT Time Calculation (min) (ACUTE ONLY): 8 min   Charges:   PT Evaluation $PT Eval Low Complexity: 1 Low   PT General Charges $$ ACUTE PT VISIT: 1 Visit         Delphia Grates. Fairly IV, PT, DPT Physical Therapist- [place]  [facility]  04/23/2023, 9:43 AM

## 2023-04-23 NOTE — Progress Notes (Signed)
 DISCHARGE NOTE:  Pt dc with IV removed and dc education given. Pt voices no questions or concerns at this time. Pt medication delivered to hospital room. Pt wheeled down to medical mall entrance by staff and pt's partner provided transportation.

## 2023-04-23 NOTE — Progress Notes (Signed)
   Neurosurgery Progress Note  History: Rodney Cisneros is here for C3-7 ACDF  Postop day 1: Patient overall is doing well.  Experiencing some pain.  He feels as though the oxycodone is keeping him awake and does not like how it makes him feel.  Physical Exam: Vitals:   04/23/23 0338 04/23/23 0727  BP: 139/77 (!) 159/70  Pulse: 92 91  Resp: 16 17  Temp: 98.4 F (36.9 C) 98.2 F (36.8 C)  SpO2: 93% 93%    AA Ox3 CNI   Side Biceps Triceps Deltoid Interossei Grip Wrist Ext. Wrist Flex.  R 4 4 4 5  4+ 5 5  L 5 5 5 5  4+ 5 5     Data:  Output by Drain (mL) 04/21/23 0701 - 04/21/23 1900 04/21/23 1901 - 04/22/23 0700 04/22/23 0701 - 04/22/23 1900 04/22/23 1901 - 04/23/23 0700 04/23/23 0701 - 04/23/23 1019  Closed System Drain 1 Anterior Neck Bulb (JP) 10 Fr.   45 30      Assessment/Plan:  Rodney Cisneros is postop day 1 status post C3-7 ACDF  - mobilize - pain control-oxycodone changed to Dilaudid - DVT prophylaxis - PT/OT - JP drain with continued high output, will recheck this afternoon. - Plan for discharge later today.  Joan Flores PA-C Department of Neurosurgery

## 2023-04-23 NOTE — Progress Notes (Signed)
 Occupational Therapy Evaluation Patient Details Name: Rodney Cisneros [facility] MRN: [id number] DOB: May 26, 1965 Today's Date: 04/23/2023   History of Present Illness   Pt is a pleasant 58 y.o. male s/p ACDF C3-C7 on 04/22/23.     Clinical Impressions Pt seen for OT evaluation this date, POD#1 from the above operation. Prior to hospital admission, pt was independent with mobility, ADL, and IADL. No falls in past 12 months. Pt lives with spouse in a single family home with 6 steps to enter with spouse able to provide 24/7 assist/support as needed for pt. Pt endorses he has not been able to sleep while in the hospital due to adverse affects from pain medication, RN in room to assess the Pt's current pain levels of 7/10. Currently pt is supervision with all aspects of mobility and self care tasks. [name] completed dressing tasks while seated on EOB with supervision, fair adherence to cervial precautions througout. Amb to BR with RW + supervision, toilet t/f STS with use of side rails. Pt drain and operative site D/C/I pre/post session. Pt handoff to PT within hallway. Pt educated in cervical precautions, self care skills, AE, and home/routines modifications to maximize safety and functional independence while minimizing falls risk and maintaining precautions. Pt verbalized understanding of all education/training provided. Able to return demonstration safe techniques while maintaining cervical precautions throughout. Handout provided to support recall and carry over of learned precautions/techniques for bed mobility, functional transfers, and self care skills. No additional skilled OT needs at this time. Will discharge in house. Upon hospital discharge, pt safe to discharge home. OT will sign off.      If plan is discharge home, recommend the following:   A little help with bathing/dressing/bathroom;Assistance with cooking/housework     Functional Status Assessment   Patient has not had a recent decline in  their functional status     Equipment Recommendations   None recommended by OT     Recommendations for Other Services         Precautions/Restrictions   Precautions Precautions: Cervical;Fall Precaution Booklet Issued: Yes (comment) Recall of Precautions/Restrictions: Intact Precaution/Restrictions Comments: limit BLT. Cervical brace. Required Braces or Orthoses: Cervical Brace Cervical Brace: Hard collar (When out of bed and ambulating. Ok to take off in bed and while eating) Restrictions Weight Bearing Restrictions Per Provider Order: No     Mobility Bed Mobility Overal bed mobility: Needs Assistance Bed Mobility: Supine to Sit, Sidelying to Sit, Rolling Rolling: Supervision Sidelying to sit: Supervision Supine to sit: Supervision          Transfers Overall transfer level: Needs assistance Equipment used: Rolling walker (2 wheels) Transfers: Sit to/from Stand, Bed to chair/wheelchair/BSC Sit to Stand: Supervision           General transfer comment: Supervision from various surfaces      Balance Overall balance assessment: Needs assistance Sitting-balance support: Feet supported, No upper extremity supported Sitting balance-Leahy Scale: Normal Sitting balance - Comments: Good reach outside BOS with ahereance to cervical precautions   Standing balance support: During functional activity, Single extremity supported Standing balance-Leahy Scale: Good                             ADL either performed or assessed with clinical judgement   ADL Overall ADL's : Needs assistance/impaired                 Upper Body Dressing : Supervision/safety;Cueing for safety  Lower Body Dressing: Sitting/lateral leans;Cueing for safety   Toilet Transfer: Cueing for safety;Supervision/safety;Rolling walker (2 wheels)   Toileting- Clothing Manipulation and Hygiene: Cueing for safety;Sit to/from stand       Functional mobility during ADLs:  Rolling walker (2 wheels);Supervision/safety General ADL Comments: Pt completed dressing tasks while seated on EOB with supervision, fair adherence to cervial precautions througout. Amb to BR with RW + supervision, toilet t/f STS with use of side rails.     Vision         Perception         Praxis         Pertinent Vitals/Pain Pain Assessment Pain Assessment: 0-10 Pain Score: 7  Pain Location: operative site Pain Descriptors / Indicators: Operative site guarding Pain Intervention(s): Limited activity within patient's tolerance, Monitored during session, Patient requesting pain meds-RN notified     Extremity/Trunk Assessment Upper Extremity Assessment Upper Extremity Assessment: Overall WFL for tasks assessed   Lower Extremity Assessment Lower Extremity Assessment: Overall WFL for tasks assessed   Cervical / Trunk Assessment Cervical / Trunk Assessment: Neck Surgery   Communication Communication Communication: No apparent difficulties   Cognition Arousal: Alert Behavior During Therapy: WFL for tasks assessed/performed, Anxious Cognition: No apparent impairments             OT - Cognition Comments: A/O x4                 Following commands: Intact       Cueing  General Comments   Cueing Techniques: Verbal cues  Drain intact pre/post session as well as operative incision D/C/I   Exercises Exercises: Other exercises Other Exercises Other Exercises: Edu: Role of OT, cervical precations (BLT), review log rolling tech, pt confident with donning/doffing cervical collar stated previously educated on technique.   Shoulder Instructions      Home Living Family/patient expects to be discharged to:: Private residence Living Arrangements: Spouse/significant other Available Help at Discharge: Family Type of Home: House Home Access: Stairs to enter Secretary/administrator of Steps: 5-6 Entrance Stairs-Rails: None Home Layout: One level     Bathroom  Shower/Tub: Chief Strategy Officer: Standard Bathroom Accessibility: Yes How Accessible: Accessible via walker Home Equipment: None          Prior Functioning/Environment Prior Level of Function : Independent/Modified Independent;Working/employed;Driving                      OT Goals(Current goals can be found in the care plan section)   Acute Rehab OT Goals Patient Stated Goal: to be pain free OT Goal Formulation: With patient/family Time For Goal Achievement: 05/07/23 Potential to Achieve Goals: Good   AM-PAC OT "6 Clicks" Daily Activity     Outcome Measure Help from another person eating meals?: None Help from another person taking care of personal grooming?: A Little Help from another person toileting, which includes using toliet, bedpan, or urinal?: None Help from another person bathing (including washing, rinsing, drying)?: A Little Help from another person to put on and taking off regular upper body clothing?: A Little Help from another person to put on and taking off regular lower body clothing?: A Little 6 Click Score: 20   End of Session Equipment Utilized During Treatment: Gait belt;Rolling walker (2 wheels) Nurse Communication: Mobility status;Patient requests pain meds  Activity Tolerance: Patient tolerated treatment well Patient left: Other (comment) (Hand off to PT within hallway)  Time: 0454-0981 OT Time Calculation (min): 22 min Charges:  OT General Charges $OT Visit: 1 Visit OT Evaluation $OT Eval Low Complexity: 1 Low OT Treatments $Self Care/Home Management : 8-22 mins  Glenard Haring M.S. OTR/L  04/23/23, 10:55 AM

## 2023-04-27 ENCOUNTER — Encounter: Payer: Medicaid Other | Admitting: Physical Therapy

## 2023-04-29 ENCOUNTER — Other Ambulatory Visit: Payer: Self-pay | Admitting: Neurosurgery

## 2023-04-29 ENCOUNTER — Encounter: Payer: Medicaid Other | Admitting: Physical Therapy

## 2023-04-29 ENCOUNTER — Telehealth: Payer: Self-pay | Admitting: Neurosurgery

## 2023-04-29 MED ORDER — HYDROMORPHONE HCL 2 MG PO TABS: 2.0000 mg | ORAL_TABLET | Freq: Four times a day (QID) | ORAL | 0 refills | Status: AC | PRN

## 2023-04-29 NOTE — Telephone Encounter (Signed)
 C3-7 ACDF on 04/22/23  Hydromorphone 2mg  every 4 hours [facility] [employer]

## 2023-04-29 NOTE — Telephone Encounter (Signed)
 Patient notified

## 2023-04-30 ENCOUNTER — Ambulatory Visit: Payer: Medicaid Other | Admitting: Neurosurgery

## 2023-05-07 ENCOUNTER — Ambulatory Visit
Admission: RE | Admit: 2023-05-07 | Discharge: 2023-05-07 | Disposition: A | Discharge status: Home / Self Care | Source: Ambulatory Visit | Attending: Neurosurgery | Admitting: Neurosurgery

## 2023-05-07 ENCOUNTER — Encounter: Payer: Self-pay | Admitting: Neurosurgery

## 2023-05-07 ENCOUNTER — Ambulatory Visit (INDEPENDENT_AMBULATORY_CARE_PROVIDER_SITE_OTHER): Admitting: Neurosurgery

## 2023-05-07 VITALS — BP 150/90 | Temp 98.1°F | Ht 72.0 in | Wt 272.0 lb

## 2023-05-07 DIAGNOSIS — R29898 Other symptoms and signs involving the musculoskeletal system: Secondary | ICD-10-CM

## 2023-05-07 DIAGNOSIS — R0602 Shortness of breath: Secondary | ICD-10-CM

## 2023-05-07 DIAGNOSIS — Z981 Arthrodesis status: Secondary | ICD-10-CM

## 2023-05-07 DIAGNOSIS — G959 Disease of spinal cord, unspecified: Secondary | ICD-10-CM

## 2023-05-07 MED ORDER — TRAMADOL HCL 50 MG PO TABS: 50.0000 mg | ORAL_TABLET | ORAL | 0 refills | Status: AC | PRN

## 2023-05-07 NOTE — Progress Notes (Signed)
   REFERRING PHYSICIAN:  Sari Cunning, Md [address] [facility] [place],  [place] [address]  DOS: 04/22/23 C3-7 ACDF   HISTORY OF PRESENT ILLNESS: [facility] is about 2 weeks status post ACDF. Overall, he is doing fair after his recent ACDF. He reports significant improvement of his arm pain but is still have right shoulder and triceps weakness similar to what he experienced inpatient although he admits it improved some. He also reports some intermittent shortness of breath with deep breathing and a productive cough  PHYSICAL EXAMINATION:  NEUROLOGICAL:  General: In no acute distress.   Awake, alert, oriented to person, place, and time.  Pupils equal round and reactive to light.  Strength:  Side Biceps Triceps Deltoid Interossei Grip Wrist Ext. Wrist Flex.  R 4 4 4+ 5 4 5 5   L 5 5 5 5  4+ 5 5   Incision c/d/I and healing well   Imaging:  No interval imaging to review  Assessment / Plan: Martin Belling [facility] is doing fair after his recently ACDF. 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 We will continue to monitor this and I have sent a referral to PT. He requested a less strong pain medication as the Hydromorphone  is causing significant drowsiness. I have sent in Tramadol  to replace it. In regards to his SOB and cough, he has been afebrile but given the productive nature we will obtain a chest xray to rule out a pneumonia. I will have the office call him with the results of this.   We discussed activity escalation and I have advised the patient to lift up to 10 pounds until 6 weeks after surgery, then increase up to 25 pounds until 12 weeks after surgery.  After 12 weeks post-op, the patient advised to increase activity as tolerated. he will return to clinic in approximately 4 weeks with cervical xrays prior.  Advised to contact the office if any questions or concerns arise.   Anastacio Karvonen  PA-C Dept of Neurosurgery

## 2023-05-13 ENCOUNTER — Telehealth: Payer: Self-pay | Admitting: Neurosurgery

## 2023-05-13 NOTE — Telephone Encounter (Signed)
 Patient to call back to go over chest X-ray results. Please let me know if he calls back.

## 2023-05-13 NOTE — Telephone Encounter (Signed)
 Patient indicates understanding of his Chest X-ray results. He will contact his PCP for further evaluation.

## 2023-05-13 NOTE — Telephone Encounter (Signed)
 Patient is calling to let our office know that his coughing has gotten much worse since seeing Danielle and would like to know if she can take a look at his x-ray images. He states that he is only sleeping about an hour for the last three nights due to all the coughing and pain from coughing. Please advise.

## 2023-05-25 ENCOUNTER — Telehealth: Payer: Self-pay | Admitting: Neurosurgery

## 2023-05-25 MED ORDER — METHOCARBAMOL 500 MG PO TABS: 500.0000 mg | ORAL_TABLET | Freq: Four times a day (QID) | ORAL | 0 refills | Status: DC | PRN

## 2023-05-25 NOTE — Telephone Encounter (Signed)
 Last seen 05/07/23. Has follow up appointment 06/02/23. Refill has been sent to [facility].

## 2023-05-25 NOTE — Telephone Encounter (Signed)
Left a detailed voicemail for the patient.  

## 2023-05-25 NOTE — Telephone Encounter (Signed)
 Prescription Request  05/25/2023  LOV: 03/17/2023  What is the name of the medication or equipment? methocarbamol  (ROBAXIN ) 500 MG tablet    Have you contacted your pharmacy to request a refill? No   Which pharmacy would you like this sent to?  [facility] [address]), [place] - [address] [place]) [place] [address] Phone: [phone] Fax: [phone]  [facility] [address] [place] [address] Phone: [phone] Fax: [phone]    Patient notified that their request is being sent to the clinical staff for review and that they should receive a response within 2 business days.   Please advise at [facility] [phone]  Patient requested [facility]

## 2023-06-01 ENCOUNTER — Other Ambulatory Visit: Payer: Self-pay

## 2023-06-01 DIAGNOSIS — G959 Disease of spinal cord, unspecified: Secondary | ICD-10-CM

## 2023-06-02 ENCOUNTER — Encounter: Admitting: Neurosurgery

## 2023-06-04 ENCOUNTER — Encounter: Admitting: Neurosurgery

## 2023-06-09 ENCOUNTER — Ambulatory Visit (INDEPENDENT_AMBULATORY_CARE_PROVIDER_SITE_OTHER): Admitting: Neurosurgery

## 2023-06-09 ENCOUNTER — Ambulatory Visit
Admission: RE | Admit: 2023-06-09 | Discharge: 2023-06-09 | Disposition: A | Discharge status: Home / Self Care | Attending: Neurosurgery | Admitting: Neurosurgery

## 2023-06-09 ENCOUNTER — Ambulatory Visit
Admission: RE | Admit: 2023-06-09 | Discharge: 2023-06-09 | Disposition: A | Discharge status: Home / Self Care | Source: Ambulatory Visit | Attending: Neurosurgery | Admitting: Neurosurgery

## 2023-06-09 ENCOUNTER — Encounter: Payer: Self-pay | Admitting: Neurosurgery

## 2023-06-09 VITALS — BP 148/96 | Temp 98.3°F | Ht 72.0 in | Wt 272.0 lb

## 2023-06-09 DIAGNOSIS — Z981 Arthrodesis status: Secondary | ICD-10-CM

## 2023-06-09 DIAGNOSIS — G959 Disease of spinal cord, unspecified: Secondary | ICD-10-CM | POA: Insufficient documentation

## 2023-06-09 NOTE — Progress Notes (Signed)
   REFERRING PHYSICIAN:  No referring provider defined for this encounter.  DOS: 04/22/23 C3-7 ACDF   HISTORY OF PRESENT ILLNESS: Rodney Cisneros is status post ACDF.   He has made some improvements.  His balance is better.  He does still have some issues with swallowing though it is improving.  His voice is not as strong as it was.   PHYSICAL EXAMINATION:  NEUROLOGICAL:  General: In no acute distress.   Awake, alert, oriented to person, place, and time.  Pupils equal round and reactive to light.  Strength:  Side Biceps Triceps Deltoid Interossei Grip Wrist Ext. Wrist Flex.  R 4+ 5 5 5  4+ 5 5  L 5 5 5 5  4+ 5 5   Incision c/d/I and healing well   Imaging:  No complications noted  Assessment / Plan: Rodney Cisneros is doing fair after his recently ACDF.   His strength has improved.  He does have some numbness on the back of his right leg.  It is not bothering him too much currently.  He has no buttock pain.  He may have a peripheral nerve issue, but he would like to wait.  If he is still having this issue in 6 weeks, we will discuss nerve conduction study.  I discussed activity limitations.  I have given him exercises for his neck.  Jodeen Munch Dept of Neurosurgery

## 2023-06-22 ENCOUNTER — Other Ambulatory Visit: Payer: Self-pay | Admitting: Neurosurgery

## 2023-06-22 ENCOUNTER — Telehealth: Payer: Self-pay | Admitting: Neurosurgery

## 2023-06-22 MED ORDER — CELECOXIB 100 MG PO CAPS: 100.0000 mg | ORAL_CAPSULE | Freq: Two times a day (BID) | ORAL | 0 refills | Status: DC

## 2023-06-22 NOTE — Telephone Encounter (Signed)
 I spoke with Rodney Cisneros to obtain clarification on the refill, as celebrex  doesn't come in 25mg  tablets and isn't taken 3 times per day.  He states he read the wrong bottle and is need of a refill of celebrex  100mg  capsules twice per day and would like it sent to [facility] [address]

## 2023-06-22 NOTE — Telephone Encounter (Signed)
 04/22/23 C3-7 ACDF  Celebrex  25mg  1 tablets 3x aday [facility] [address]

## 2023-07-03 ENCOUNTER — Other Ambulatory Visit: Payer: Self-pay | Admitting: Orthopedic Surgery

## 2023-07-03 DIAGNOSIS — G959 Disease of spinal cord, unspecified: Secondary | ICD-10-CM

## 2023-07-03 DIAGNOSIS — Z981 Arthrodesis status: Secondary | ICD-10-CM

## 2023-07-03 NOTE — Progress Notes (Deleted)
   REFERRING PHYSICIAN:  Sari Cunning, Md [address] [facility] [place],  [place] [address]  DOS: 04/22/23 C3-7 ACDF   HISTORY OF PRESENT ILLNESS:  He was doing well at his last visit. Swallowing was improving. Voice as still not as strong as it was.       He does have some numbness on the back of his right leg.  It is not bothering him too much currently.  He has no buttock pain.  He may have a peripheral nerve issue, but he would like to wait.  If he is still having this issue in 6 weeks, we will discuss nerve conduction study.   PHYSICAL EXAMINATION:  NEUROLOGICAL:  General: In no acute distress.   Awake, alert, oriented to person, place, and time.  Pupils equal round and reactive to light.  Strength:  Side Biceps Triceps Deltoid Interossei Grip Wrist Ext. Wrist Flex.  R 4+*** 5 5 5  4+*** 5 5  L 5 5 5 5  4+*** 5 5   Incision c/d/I and healing well   Imaging:  Cervical xrays dated ***:  No complications noted. ***  Report for above xrays not yet available.   Assessment / Plan: [facility] is doing fair after his recently ACDF. Treatment options reviewed with patient and following plan made:   - He can slowly return to activity as tolerated.  - Follow up in 6 months with repeat xrays.    I discussed activity limitations.  I have given him exercises for his neck.  Lucetta Russel PA-C Dept of Neurosurgery

## 2023-07-14 ENCOUNTER — Encounter: Admitting: Orthopedic Surgery

## 2023-07-14 DIAGNOSIS — G959 Disease of spinal cord, unspecified: Secondary | ICD-10-CM

## 2023-07-14 DIAGNOSIS — Z981 Arthrodesis status: Secondary | ICD-10-CM

## 2023-07-19 ENCOUNTER — Other Ambulatory Visit: Payer: Self-pay | Admitting: Neurosurgery

## 2023-07-24 NOTE — Progress Notes (Unsigned)
   REFERRING PHYSICIAN:  Cleotilde Oneil FALCON, Md [address] [facility] [place],  [place] [address]  DOS: 04/22/23 C3-7 ACDF   HISTORY OF PRESENT ILLNESS:  He was doing well at his last visit. Swallowing was improving. Voice as still not as strong as it was.   He has gotten worse since his last visit. He continues with constant neck pain. He has left shoulder pain when moving the arm and continues with left arm weakness. Since surgery, he notes constant pain in right posterior leg pain with tingling in his right calf and foot. Having trouble sleeping due to pain- cannot get comfortable. Feels like his balance is off.    PHYSICAL EXAMINATION:  NEUROLOGICAL:  General: In no acute distress.   Awake, alert, oriented to person, place, and time.  Pupils equal round and reactive to light.  Strength:  Side Biceps Triceps Deltoid Interossei Grip Wrist Ext. Wrist Flex.  R 4+ 5 5 5 5 5 5   L 4 4 5 5  4+ 4 4   Side Iliopsoas Quads Hamstring PF DF EHL  R 5 5 5 5 5 5   L 5 5 5 5 5 5    Cervical incision is well healed.   He has painful limited ROM of left shoulder. Good ROM of right shoulder.    Imaging:  Cervical xrays dated 07/28/23:  No complications noted.   Report for above xrays not yet available.   Assessment / Plan: Rodney Cisneros is doing fair after his recently ACDF. Treatment options reviewed with patient and following plan made:   - Will discuss increased pain/weakness in left arm with Dr. Clois and message him with further recommendations.  - EMG/NCS of bilateral lower extremities to evaluate right leg pain/tingling. Orders to neurology at [facility].  - New prescription for neurontin  to help with symptoms. Will start q hs and increase to bid as tolerated. Reviewed dosing and side effects.  - Referral to ortho at [facility] for left shoulder pain.  - Will schedule follow up once I review with Dr. Clois.   I spent a total of 30 minutes in face-to-face and  non-face-to-face activities related to this patient's care today including review of outside records, review of imaging, review of symptoms, physical exam, discussion of differential diagnosis, discussion of treatment options, and documentation.   Glade Boys PA-C Dept of Neurosurgery

## 2023-07-28 ENCOUNTER — Ambulatory Visit
Admission: RE | Admit: 2023-07-28 | Discharge: 2023-07-28 | Disposition: A | Discharge status: Home / Self Care | Source: Ambulatory Visit | Attending: Orthopedic Surgery | Admitting: Orthopedic Surgery

## 2023-07-28 ENCOUNTER — Ambulatory Visit
Admission: RE | Admit: 2023-07-28 | Discharge: 2023-07-28 | Disposition: A | Discharge status: Home / Self Care | Attending: Orthopedic Surgery | Admitting: Orthopedic Surgery

## 2023-07-28 DIAGNOSIS — G959 Disease of spinal cord, unspecified: Secondary | ICD-10-CM

## 2023-07-28 DIAGNOSIS — Z981 Arthrodesis status: Secondary | ICD-10-CM | POA: Insufficient documentation

## 2023-07-29 ENCOUNTER — Encounter: Payer: Self-pay | Admitting: Orthopedic Surgery

## 2023-07-29 ENCOUNTER — Telehealth: Payer: Self-pay | Admitting: Orthopedic Surgery

## 2023-07-29 ENCOUNTER — Ambulatory Visit: Admitting: Orthopedic Surgery

## 2023-07-29 VITALS — BP 136/84 | Ht 72.0 in | Wt 272.0 lb

## 2023-07-29 DIAGNOSIS — M5412 Radiculopathy, cervical region: Secondary | ICD-10-CM

## 2023-07-29 DIAGNOSIS — M5416 Radiculopathy, lumbar region: Secondary | ICD-10-CM

## 2023-07-29 DIAGNOSIS — M25512 Pain in left shoulder: Secondary | ICD-10-CM

## 2023-07-29 DIAGNOSIS — Z981 Arthrodesis status: Secondary | ICD-10-CM

## 2023-07-29 MED ORDER — GABAPENTIN 300 MG PO CAPS: ORAL_CAPSULE | ORAL | 0 refills | Status: DC

## 2023-07-29 NOTE — Telephone Encounter (Signed)
EMG order faxed.

## 2023-07-29 NOTE — Patient Instructions (Signed)
 It was so nice to see you today. Thank you so much for coming in.    I will review things with Dr. Clois and message you with his further recommendations.   I think pain in your left shoulder is from the shoulder. I want you to see ortho at the [facility] clinic for evaluation of this. They should call you to schedule an appointment or you can call them at [phone].   I want you to see neurology at the [facility] clinic for an EMG/nerve test. I want you to see Dr. Maree. should call you to schedule an appointment or you can call them at [phone].   I sent gabapentin  to your pharmacy to help with nerve pain. Take at night x 7 days, then can take at lunch and at night. Common side effects are feeling sleepy, dizzy, or drunk. Call me if any issues.   Please do not hesitate to call if you have any questions or concerns. You can also message me in MyChart.   Glade Boys PA-C [phone]     The physicians and staff at [facility] Neurosurgery at [facility] are committed to providing excellent care. You may receive a survey asking for feedback about your experience at our office. We value you your feedback and appreciate you taking the time to to fill it out. The [facility] leadership team is also available to discuss your experience in person, feel free to contact us  [phone].

## 2023-07-29 NOTE — Telephone Encounter (Signed)
 Sent order for EMG Of bilateral lower extremities to neurology at [facility] Anastacio).   Thanks!

## 2023-07-31 NOTE — Addendum Note (Signed)
 Addended byBETHA HILMA HASTINGS on: 07/31/2023 11:02 AM   Modules accepted: Orders

## 2023-08-13 ENCOUNTER — Ambulatory Visit
Admission: RE | Admit: 2023-08-13 | Discharge: 2023-08-13 | Disposition: A | Discharge status: Home / Self Care | Source: Ambulatory Visit | Attending: Orthopedic Surgery | Admitting: Orthopedic Surgery

## 2023-08-13 DIAGNOSIS — Z981 Arthrodesis status: Secondary | ICD-10-CM | POA: Insufficient documentation

## 2023-08-13 DIAGNOSIS — M5416 Radiculopathy, lumbar region: Secondary | ICD-10-CM | POA: Diagnosis present

## 2023-08-13 DIAGNOSIS — M5412 Radiculopathy, cervical region: Secondary | ICD-10-CM | POA: Diagnosis present

## 2023-08-13 DIAGNOSIS — M25512 Pain in left shoulder: Secondary | ICD-10-CM | POA: Insufficient documentation

## 2023-08-16 ENCOUNTER — Other Ambulatory Visit: Payer: Self-pay | Admitting: Neurosurgery

## 2023-08-17 NOTE — Telephone Encounter (Signed)
 Celebrex  refilled by [facility] today.

## 2023-08-20 ENCOUNTER — Encounter: Payer: Self-pay | Admitting: Orthopedic Surgery

## 2023-08-20 NOTE — Progress Notes (Signed)
 Cervical MRI dated 08/13/23:  FINDINGS: Alignment: Physiologic.   Vertebrae: Anterior cervical fusion from C3 through C7. No acute fracture is identified.   Cord: Normal signal and morphology.   Posterior Fossa, vertebral arteries, paraspinal tissues: The visualized portions of the skull base and the posterior fossa are normal. No soft tissue abnormality is identified.   Disc levels:   C2-C3: The disk is normal in configuration. Mild left facet arthropathy. No uncovertebral joint disease. No neuroforaminal stenosis. No spinal canal stenosis.   C3-C4: Disc osteophyte complex. Mild bilateral facet arthropathy. Moderate bilateral uncovertebral joint disease. Moderate bilateral neuroforaminal stenosis. No spinal canal stenosis.   C4-C5: Disc osteophyte complex. Mild bilateral facet arthropathy. Moderate left uncovertebral joint disease. Moderate left neuroforaminal stenosis. No spinal canal stenosis.   C5-C6: Disc osteophyte complex. No facet arthropathy. Severe right and mild left uncovertebral joint disease. Severe right and mild left neuroforaminal stenosis. No spinal canal stenosis.   C6-C7: Disc osteophyte complex. Mild bilateral facet arthropathy. Severe bilateral uncovertebral joint disease. Severe bilateral neuroforaminal stenosis. No spinal canal stenosis.   C7-T1: The disk is normal in configuration. Mild right facet arthropathy. Moderate right uncovertebral joint disease. Moderate right neuroforaminal stenosis. No spinal canal stenosis.   IMPRESSION: 1. Anterior cervical fusion from C3 through C7. 2. 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 3. Moderate foraminal stenoses bilaterally at C3-C4, on the left at C4-C5 and on the right at C7-T1.     Electronically Signed   By: Clem Savory M.D.   On: 08/19/2023 15:55  Above MRI reviewed with Dr. Clois. He recommends CT of cervical spine  and follow up with him to review. Will discuss with patient when we review his results.

## 2023-08-26 ENCOUNTER — Other Ambulatory Visit: Payer: Self-pay | Admitting: Orthopedic Surgery

## 2023-08-26 DIAGNOSIS — Z981 Arthrodesis status: Secondary | ICD-10-CM

## 2023-08-26 DIAGNOSIS — M25512 Pain in left shoulder: Secondary | ICD-10-CM

## 2023-08-26 DIAGNOSIS — M5412 Radiculopathy, cervical region: Secondary | ICD-10-CM

## 2023-08-26 DIAGNOSIS — M5416 Radiculopathy, lumbar region: Secondary | ICD-10-CM

## 2023-09-03 NOTE — Progress Notes (Signed)
   Telephone Visit- Progress Note:  He was scheduled for telephone visit and did not answer his phone. I left a VM for him to call back. We will reach out to him to reschedule.    Visit not done.   Glade Boys PA-C Neurosurgery

## 2023-09-04 ENCOUNTER — Encounter: Admitting: Orthopedic Surgery

## 2023-09-10 NOTE — Progress Notes (Unsigned)
 Telephone Visit- Progress Note: Referring Physician:  Cleotilde Oneil FALCON, MD [address] [facility] [place],  [place] [address]  Primary Physician:  Cleotilde Oneil FALCON, MD  This visit was performed via telephone.  Patient location: home Provider location: working from home  I spent a total of 15 minutes non-face-to-face activities for this visit on the date of this encounter including review of current clinical condition and response to treatment.    Patient has given verbal consent to this telephone visits and we reviewed the limitations of a telephone visit. Patient wishes to proceed.    Chief Complaint:  review imaging  History of Present Illness: Rodney Cisneros is a 58 y.o. male has a history of HTN, GERD.   DOS: 04/22/23 C3-7 ACDF   He was doing worse at his last visit with me on 07/29/23. He had constant neck pain along with left arm weakness. He had pain in left shoulder along with feeling like his balance was off.   He continued with constant right posterior leg pain with tingling in right calf and foot.   He was sent to ortho for his shoulder- was seen on 08/25/23 and had left shoulder injection. Scheduled for lower extremity EMG on 09/23/23.   Phone visit scheduled to review his cervical MRI.   His left shoulder pain is better since shoulder injection with ortho. He continues with constant neck pain that has improved just a little. He feels like weakness in left arm has improved, but is still there. He still feels like his balance is off.   He also continues with constant pain in right posterior leg pain with tingling in his right calf and foot.   Exam: No exam done as this was a telephone encounter.     Imaging: Cervical MRI dated 08/13/23:  FINDINGS: Alignment: Physiologic.   Vertebrae: Anterior cervical fusion from C3 through C7. No acute fracture is identified.   Cord: Normal signal and morphology.   Posterior Fossa, vertebral  arteries, paraspinal tissues: The visualized portions of the skull base and the posterior fossa are normal. No soft tissue abnormality is identified.   Disc levels:   C2-C3: The disk is normal in configuration. Mild left facet arthropathy. No uncovertebral joint disease. No neuroforaminal stenosis. No spinal canal stenosis.   C3-C4: Disc osteophyte complex. Mild bilateral facet arthropathy. Moderate bilateral uncovertebral joint disease. Moderate bilateral neuroforaminal stenosis. No spinal canal stenosis.   C4-C5: Disc osteophyte complex. Mild bilateral facet arthropathy. Moderate left uncovertebral joint disease. Moderate left neuroforaminal stenosis. No spinal canal stenosis.   C5-C6: Disc osteophyte complex. No facet arthropathy. Severe right and mild left uncovertebral joint disease. Severe right and mild left neuroforaminal stenosis. No spinal canal stenosis.   C6-C7: Disc osteophyte complex. Mild bilateral facet arthropathy. Severe bilateral uncovertebral joint disease. Severe bilateral neuroforaminal stenosis. No spinal canal stenosis.   C7-T1: The disk is normal in configuration. Mild right facet arthropathy. Moderate right uncovertebral joint disease. Moderate right neuroforaminal stenosis. No spinal canal stenosis.   IMPRESSION: 1. Anterior cervical fusion from C3 through C7. 2. 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 3. Moderate foraminal stenoses bilaterally at C3-C4, on the left at C4-C5 and on the right at C7-T1.     Electronically Signed   By: Clem Savory M.D.   On: 08/19/2023 15:55  I have personally reviewed the images and agree with the above interpretation. Reviewed with Dr.  Clois prior to this visit.   Assessment and Plan: Rodney Cisneros [facility] is doing fair after his recently ACDF.  He did see improvement in left shoulder pain after injection with ortho. He continues with  constant neck pain with left arm weakness and balance issues.   MRI reviewed with patient.   Treatment options reviewed with patient and following plan made:   - CT of cervical spine to further address symptoms.  - Keep scheduled EMG/NCS of lower extremities scheduled on 09/23/23.  - Has seen some improvement with neurontin . No side effects. He is taking 300mg  bid, will increase to tid. Reviewed side effects. Call if any issues.  - Follow up with Dr. Clois to review CT once we have results. May be able to review EMG at that time as well.   Glade Boys PA-C Neurosurgery

## 2023-09-11 ENCOUNTER — Ambulatory Visit (INDEPENDENT_AMBULATORY_CARE_PROVIDER_SITE_OTHER): Admitting: Orthopedic Surgery

## 2023-09-11 ENCOUNTER — Encounter: Payer: Self-pay | Admitting: Orthopedic Surgery

## 2023-09-11 DIAGNOSIS — Z09 Encounter for follow-up examination after completed treatment for conditions other than malignant neoplasm: Secondary | ICD-10-CM | POA: Diagnosis not present

## 2023-09-11 DIAGNOSIS — R29898 Other symptoms and signs involving the musculoskeletal system: Secondary | ICD-10-CM | POA: Diagnosis not present

## 2023-09-11 DIAGNOSIS — R2681 Unsteadiness on feet: Secondary | ICD-10-CM | POA: Diagnosis not present

## 2023-09-11 DIAGNOSIS — M5412 Radiculopathy, cervical region: Secondary | ICD-10-CM

## 2023-09-11 DIAGNOSIS — Z981 Arthrodesis status: Secondary | ICD-10-CM

## 2023-09-11 DIAGNOSIS — G959 Disease of spinal cord, unspecified: Secondary | ICD-10-CM

## 2023-09-11 DIAGNOSIS — M542 Cervicalgia: Secondary | ICD-10-CM

## 2023-09-16 ENCOUNTER — Other Ambulatory Visit: Payer: Self-pay | Admitting: Neurosurgery

## 2023-10-05 ENCOUNTER — Telehealth: Payer: Self-pay | Admitting: Orthopedic Surgery

## 2023-10-05 NOTE — Telephone Encounter (Signed)
 I ordered CT of cervical spine almost a month ago. Please see if he needs help scheduling this.   Once this is done, he will need a f/u with Clois to review.   I have his EMG results back. He can review with Clois at that visit or he can do a phone visit with me.

## 2023-10-05 NOTE — Telephone Encounter (Signed)
 Pt provided # for CT scan, pt also wants to review his results for the ct scan and emg with Dr.Y

## 2023-10-14 ENCOUNTER — Ambulatory Visit
Admission: RE | Admit: 2023-10-14 | Discharge: 2023-10-14 | Disposition: A | Discharge status: Home / Self Care | Source: Ambulatory Visit | Attending: Orthopedic Surgery | Admitting: Orthopedic Surgery

## 2023-10-14 DIAGNOSIS — Z981 Arthrodesis status: Secondary | ICD-10-CM | POA: Insufficient documentation

## 2023-10-14 DIAGNOSIS — G959 Disease of spinal cord, unspecified: Secondary | ICD-10-CM | POA: Insufficient documentation

## 2023-10-14 DIAGNOSIS — M5412 Radiculopathy, cervical region: Secondary | ICD-10-CM | POA: Diagnosis present

## 2023-10-18 ENCOUNTER — Telehealth: Payer: Self-pay | Admitting: Orthopedic Surgery

## 2023-10-18 NOTE — Telephone Encounter (Signed)
 Please schedule him 15 minute f/u with Clois to review his cervical CT and EMG results.     Cervical spine CT dated 10/14/23:  FINDINGS:   CERVICAL SPINE: BONES AND ALIGNMENT: Anterior cervical fusion from C3 through C7. No hardware adverse features. No acute fracture or traumatic malalignment.   DEGENERATIVE CHANGES: No significant degenerative changes.   SOFT TISSUES: No prevertebral soft tissue swelling.   IMPRESSION: 1. No acute abnormality of the cervical spine. 2. Status post anterior cervical fusion from C3 through C7.   Electronically signed by: Franky Stanford MD 10/17/2023 10:25 PM EDT RP Workstation: [id number]  I have personally reviewed the images and agree with the above interpretation.

## 2023-10-19 NOTE — Telephone Encounter (Signed)
 Left message to call our office back.

## 2023-10-19 NOTE — Telephone Encounter (Signed)
 Patient scheduled on 10/19/2023 at 4:45 and patient confirmed.

## 2023-10-20 ENCOUNTER — Ambulatory Visit: Admitting: Neurosurgery

## 2023-11-15 ENCOUNTER — Other Ambulatory Visit: Payer: Self-pay | Admitting: Neurosurgery

## 2023-11-17 ENCOUNTER — Other Ambulatory Visit: Payer: Self-pay | Admitting: Neurosurgery

## 2023-11-17 DIAGNOSIS — Z981 Arthrodesis status: Secondary | ICD-10-CM

## 2023-11-17 DIAGNOSIS — M25512 Pain in left shoulder: Secondary | ICD-10-CM

## 2023-11-17 DIAGNOSIS — M5416 Radiculopathy, lumbar region: Secondary | ICD-10-CM

## 2023-11-17 DIAGNOSIS — M5412 Radiculopathy, cervical region: Secondary | ICD-10-CM

## 2023-12-01 LAB — COLOGUARD: COLOGUARD: NEGATIVE

## 2023-12-14 ENCOUNTER — Other Ambulatory Visit: Payer: Self-pay | Admitting: Neurosurgery

## 2023-12-14 DIAGNOSIS — M5412 Radiculopathy, cervical region: Secondary | ICD-10-CM

## 2023-12-14 DIAGNOSIS — M5416 Radiculopathy, lumbar region: Secondary | ICD-10-CM

## 2023-12-14 DIAGNOSIS — Z981 Arthrodesis status: Secondary | ICD-10-CM

## 2023-12-14 DIAGNOSIS — M25512 Pain in left shoulder: Secondary | ICD-10-CM

## 2023-12-28 NOTE — Progress Notes (Signed)
 ANNUAL EXAM  Rodney Cisneros is a 58 y.o. male  CHIEF COMPLAINT: Chief Complaint  Patient presents with   Follow-up    Patient became tearful during visit. He is very excitable reporting he is having a panic attack.   Blood pressure elevated to 200/100 with pulse 104.After sitting with him,pressure decreased 160/100.    SUBJECTIVE: Patient postop cervical fusion back in April, now having left long track signs.  Has psoriatic arthritis and sees rheumatology routinely.  Having a lot of left hand and wrist inflammation.  Has significant depression and anxiety, almost a bipolar waxing and waning.  Currently having a panic attack and getting through it in the office.  No suicidal thoughts.  On Cymbalta .  Feels like the Lamictal  is not really helping him ______________________________________________________________________ A comprehensive ROS was negative in all 10 systems reviewed.  ALLERGIES: Sulfa (sulfonamide antibiotics)  Past Medical History:  Diagnosis Date   Allergic rhinitis    Anxiety    Colitis 02/20/2014   active   Depression    Diverticulosis    History of epididymitis    and prostatitis   Tubular adenoma of colon 09/26/2015    Past Surgical History:  Procedure Laterality Date   LAMINECTOMY LUMBAR SPINE  1991   EGD  10/13/2006   No repeat per RTE   COLONOSCOPY  02/20/2014   Active colitis/ Negative polyp/repeat 33yr/PYO   COLONOSCOPY  09/26/2015   Tubular adenoma of colon/Diverticulosis/Repeat 60yrs/MUS   COLONOSCOPY  12/02/2017    Tubular adenoma of the colon/Repeat 56yrs/MUS   EGD  12/02/2017   Gastritis/No Repeat/MUS   FUNCTIONAL ENDOSCOPIC SINUS SURGERY      Current Outpatient Medications  Medication Sig Dispense Refill   celecoxib  (CELEBREX ) 100 MG capsule Take 1 capsule (100 mg total) by mouth once daily 60 capsule 11   colchicine  (COLCRYS ) 0.6 mg tablet Take 1 tablet (0.6 mg total) by mouth 2 (two) times daily as needed 60 tablet 3    DULoxetine  (CYMBALTA ) 60 MG DR capsule Take 1 capsule (60 mg total) by mouth once daily 90 capsule 3   gabapentin  (NEURONTIN ) 300 MG capsule 300 mg     hydrocortisone 2.5 % cream Apply topically 2 (two) times daily 30 g 5   meclizine  (ANTIVERT ) 25 mg tablet TAKE 1 TABLET BY MOUTH THREE TIMES DAILY AS NEEDED FOR DIZZINESS FOR UP TO 100 DAYS 50 tablet 0   metoprolol  TARTrate (LOPRESSOR ) 50 MG tablet Take 1 tablet (50 mg total) by mouth 2 (two) times daily 180 tablet 3   sennosides (SENOKOT) 8.6 mg tablet Take 1 tablet by mouth     traZODone (DESYREL) 50 MG tablet Take 1 tablet (50 mg total) by mouth at bedtime 30 tablet 11   ARIPiprazole (ABILIFY) 2 MG tablet Take 1 tablet (2 mg total) by mouth once daily for 90 days 30 tablet 2   No current facility-administered medications for this visit.    PHYSICAL EXAM: BP (!) 160/90   Pulse 93   Wt (!) 118 kg (260 lb 3.2 oz)   SpO2 95%   BMI 35.29 kg/m   Body mass index is 35.29 kg/m.  Wt Readings from Last 2 Encounters:  12/28/23 (!) 118 kg (260 lb 3.2 oz)  09/22/23 (!) 116.1 kg (256 lb)    BP Readings from Last 2 Encounters:  12/28/23 (!) 160/90  09/22/23 110/68    General. Alert oriented x3  Skin. No suspicious lesions or moles on trunk or abdomen Eyes. Sclera and conjunctiva clear;  pupils equal round and reactive to light and accommodation; extraocular movements intact Ears. External normal; canals clear; tympanic membranes normal Nose. Mucosa healthy without drainage or ulceration Oropharynx. No suspicious lesions Neck. No swelling, masses, stiffness, pain, limited movement, carotid pulses normal bilaterally, thyroid normal size, no masses palpated.  No bruits Breasts. No masses noted Lungs. Respirations unlabored; clear to auscultation bilaterally Back. No spinal deformity Cardiovascular. Heart regular rate and rhythm without murmurs, gallops, or rubs Abdomen. Soft; non tender; non distended; normoactive bowel sounds; no  masses or organomegaly Lymph Nodes. No significant cervical, supraclavicular, axillary or inguinal lymphadenopathy noted Musculoskeletal. No deformities; no active joint inflammation Extremities. Normal, no edema Pulses. Dorsalis pedis palpable and symmetric bilaterally Neurologic. Alert and oriented X3; speech intact; face symmetrical; moves all extremities well    ASSESSMENT/PLAN:  Anxiety/depression-moderate depression, almost of bipolar, currently getting through a panic attack.  Quite tearful, no suicidal thoughts.  Knows to go to the ER for any suicidal plan or progressive thoughts.  He is agreeable to having [facility] contact him.  Very limited funds.  He feels like Lamictal  is not helping, off Lamictal  on Abilify 2 mg daily, he will let me know how he is doing, continue Cymbalta  Psoriatic arthritis-significant left hand inflammation, Celebrex , followed by rheumatology Cervical disc disease-postop surgery in April, currently with left hand symptoms. History alcohol-rare use of alcohol now  3-week follow-up   Goals Addressed   None     .phq2  Return in about 1 year (around 12/27/2024) for physical.

## 2023-12-29 ENCOUNTER — Ambulatory Visit: Admitting: Neurosurgery

## 2023-12-29 ENCOUNTER — Encounter: Payer: Self-pay | Admitting: Neurosurgery

## 2023-12-29 VITALS — BP 132/70 | Ht 72.0 in | Wt 248.0 lb

## 2023-12-29 DIAGNOSIS — Z981 Arthrodesis status: Secondary | ICD-10-CM

## 2023-12-29 DIAGNOSIS — M961 Postlaminectomy syndrome, not elsewhere classified: Secondary | ICD-10-CM

## 2023-12-29 DIAGNOSIS — M542 Cervicalgia: Secondary | ICD-10-CM

## 2023-12-29 NOTE — Patient Instructions (Signed)
 [facility] - televisits [phone] Not in network with [facility] 20[facility] - televisits [phone] Not in network with [facility] 2024 [facility] in [place] Provider: Duwaine [place], [place] [phone] Accepts [facility] Dr Corina in [place] [phone] Accepts [facility], but is typically booked out about 10 months Dr Achilles in [place] [phone]  [facility] (offices in [place], [place], and [place]) [phone]   Please let us  know which one of the above psychologist's you would like to see for evaluation prior to the spinal cord stimulator trial. These are the only providers we are aware of that perform this type of evaluation. Once we fax the referral, please call them to set up an appointment (they do not typically call you).

## 2023-12-29 NOTE — Progress Notes (Signed)
° °  REFERRING PHYSICIAN:  Cleotilde Oneil FALCON, Md [address] [facility] [place],  [place] [address]  DOS: 04/22/23 C3-7 ACDF   HISTORY OF PRESENT ILLNESS: Jaivian Battaglini [facility] is status post ACDF.   He has made some improvements.  His balance is better.  He has continued L sided neck pain. He has intrascapular pain.  His pain can be variable at times.  He has tried Celebrex  as well as heat and cold packs and physical therapy for his neck pain.  He is also having lower back pain and some numbness in his leg.  He does still have some issues with swallowing though it is improving.  His voice is not as strong as it was.  PHYSICAL EXAMINATION:  NEUROLOGICAL:  General: In no acute distress.   Awake, alert, oriented to person, place, and time.  Pupils equal round and reactive to light.  Strength:  Side Biceps Triceps Deltoid Interossei Grip Wrist Ext. Wrist Flex.  R 4+ 5 5 5  4+ 5 5  L 5 5 5 5  4+ 5 5   Incision c/d/I and healing well   Imaging:  MRI C spine 08/13/2023 IMPRESSION: 1. Anterior cervical fusion from C3 through C7. 2. 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 3. Moderate foraminal stenoses bilaterally at C3-C4, on the left at C4-C5 and on the right at C7-T1.     Electronically Signed   By: Clem Savory M.D.   On: 08/19/2023 15:55    CT C spine 10/14/2023 BONES AND ALIGNMENT: Anterior cervical fusion from C3 through C7. No hardware adverse features. No acute fracture or traumatic malalignment.   DEGENERATIVE CHANGES: No significant degenerative changes.   SOFT TISSUES: No prevertebral soft tissue swelling.   IMPRESSION: 1. No acute abnormality of the cervical spine. 2. Status post anterior cervical fusion from C3 through C7.   Electronically signed by: Franky Stanford MD 10/17/2023 10:25 PM EDT RP Workstation: [id number]  EMG 09/23/2023 Impression: This is an abnormal  electrodiagnostic study consistent with (1) generalized sensorimotor peripheral neuropathy. (2) right chronic S1 radiculopathy.   Thank you for the referral of this patient. It was our privilege to participate in care of your patient. Feel free to contact us  with any further questions.  _____________________________ Jannett Fairly, M.D.   Assessment / Plan: Dionysios Massman [facility] is doing fair after his recently ACDF.   Some of his current clinical presentation is complicated by his psoriatic arthritis.  He is working through treatments with Dr. Tobie currently.  I will refer him back to Dr. Emogene to consider medial branch blocks or ablations for his neck pain.  He may also consider trigger point injections.    He meets indications for postlaminectomy syndrome.  I recommended that he consider spinal cord stimulator.  I will send him for the appropriate workup including MRI of the thoracic spine, psychology evaluation, and spinal cord stimulation trial.  I spent a total of 20 minutes in this patient's care today. This time was spent reviewing pertinent records including imaging studies, obtaining and confirming history, performing a directed evaluation, formulating and discussing my recommendations, and documenting the visit within the medical record.    [facility]

## 2024-01-15 ENCOUNTER — Other Ambulatory Visit: Payer: Self-pay | Admitting: Physician Assistant

## 2024-01-15 DIAGNOSIS — M25512 Pain in left shoulder: Secondary | ICD-10-CM

## 2024-01-15 DIAGNOSIS — M5412 Radiculopathy, cervical region: Secondary | ICD-10-CM

## 2024-01-15 DIAGNOSIS — M5416 Radiculopathy, lumbar region: Secondary | ICD-10-CM

## 2024-01-15 DIAGNOSIS — Z981 Arthrodesis status: Secondary | ICD-10-CM

## 2024-02-14 ENCOUNTER — Other Ambulatory Visit: Payer: Self-pay | Admitting: Physician Assistant

## 2024-02-14 DIAGNOSIS — Z981 Arthrodesis status: Secondary | ICD-10-CM

## 2024-02-14 DIAGNOSIS — M5412 Radiculopathy, cervical region: Secondary | ICD-10-CM

## 2024-02-14 DIAGNOSIS — M25512 Pain in left shoulder: Secondary | ICD-10-CM

## 2024-02-14 DIAGNOSIS — M5416 Radiculopathy, lumbar region: Secondary | ICD-10-CM
